# Patient Record
Sex: Female | Born: 1955 | Hispanic: Yes | Marital: Married | Smoking: Never smoker
Health system: Southern US, Community
[De-identification: ages and names within clinical notes are randomized; demographics above are authoritative.]

## PROBLEM LIST (undated history)

## (undated) DIAGNOSIS — I219 Acute myocardial infarction, unspecified: Secondary | ICD-10-CM

## (undated) DIAGNOSIS — E7800 Pure hypercholesterolemia, unspecified: Secondary | ICD-10-CM

## (undated) DIAGNOSIS — E119 Type 2 diabetes mellitus without complications: Secondary | ICD-10-CM

## (undated) DIAGNOSIS — I6529 Occlusion and stenosis of unspecified carotid artery: Secondary | ICD-10-CM

## (undated) DIAGNOSIS — R000 Tachycardia, unspecified: Secondary | ICD-10-CM

## (undated) DIAGNOSIS — I10 Essential (primary) hypertension: Secondary | ICD-10-CM

## (undated) DIAGNOSIS — Z992 Dependence on renal dialysis: Secondary | ICD-10-CM

## (undated) DIAGNOSIS — N186 End stage renal disease: Secondary | ICD-10-CM

## (undated) HISTORY — PX: TUBAL LIGATION: SHX77

## (undated) HISTORY — PX: CARDIAC CATHETERIZATION: SHX172

## (undated) HISTORY — PX: KNEE SURGERY: SHX244

---

## 2004-08-03 ENCOUNTER — Ambulatory Visit: Payer: Self-pay | Admitting: Family Medicine

## 2005-01-26 ENCOUNTER — Ambulatory Visit: Payer: Self-pay | Admitting: Family Medicine

## 2006-09-28 ENCOUNTER — Ambulatory Visit: Payer: Self-pay | Admitting: Family Medicine

## 2008-02-20 ENCOUNTER — Ambulatory Visit: Payer: Self-pay | Admitting: Family Medicine

## 2010-06-03 ENCOUNTER — Ambulatory Visit: Payer: Self-pay | Admitting: Family Medicine

## 2010-07-21 ENCOUNTER — Emergency Department: Payer: Self-pay

## 2011-05-04 IMAGING — CR DG KNEE COMPLETE 4+V*R*
1 series · 4 of 4 positions shown · non-contrast
Comparison: none

REASON FOR EXAM: injury
COMMENTS:   LMP: Post-Menopausal

[Series 1: view not recorded · 0.17mm/px · 4 of 4 slices shown]
[im 1/4]
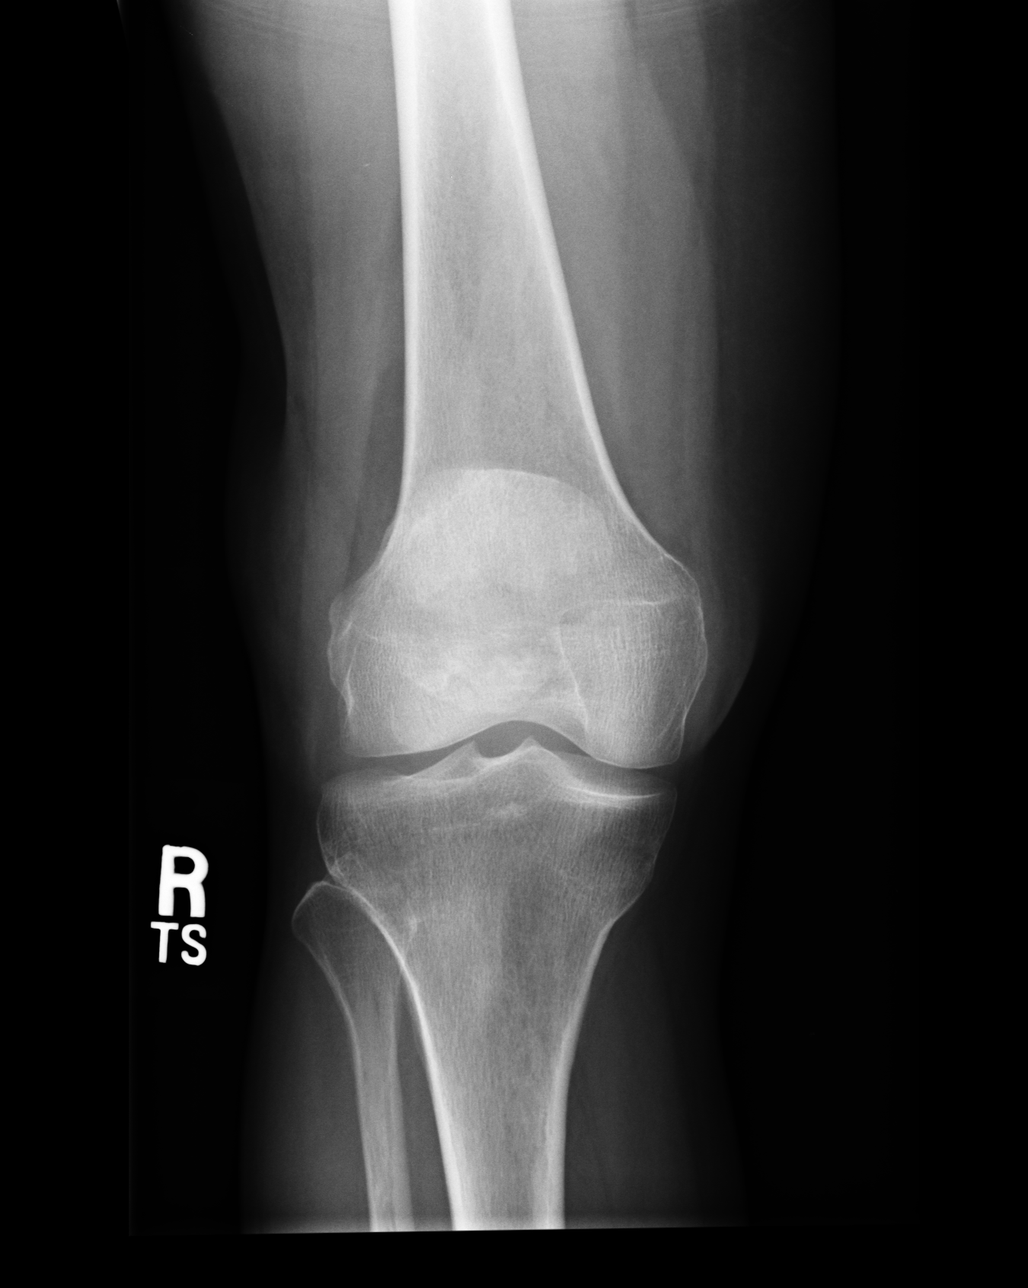
[im 2/4]
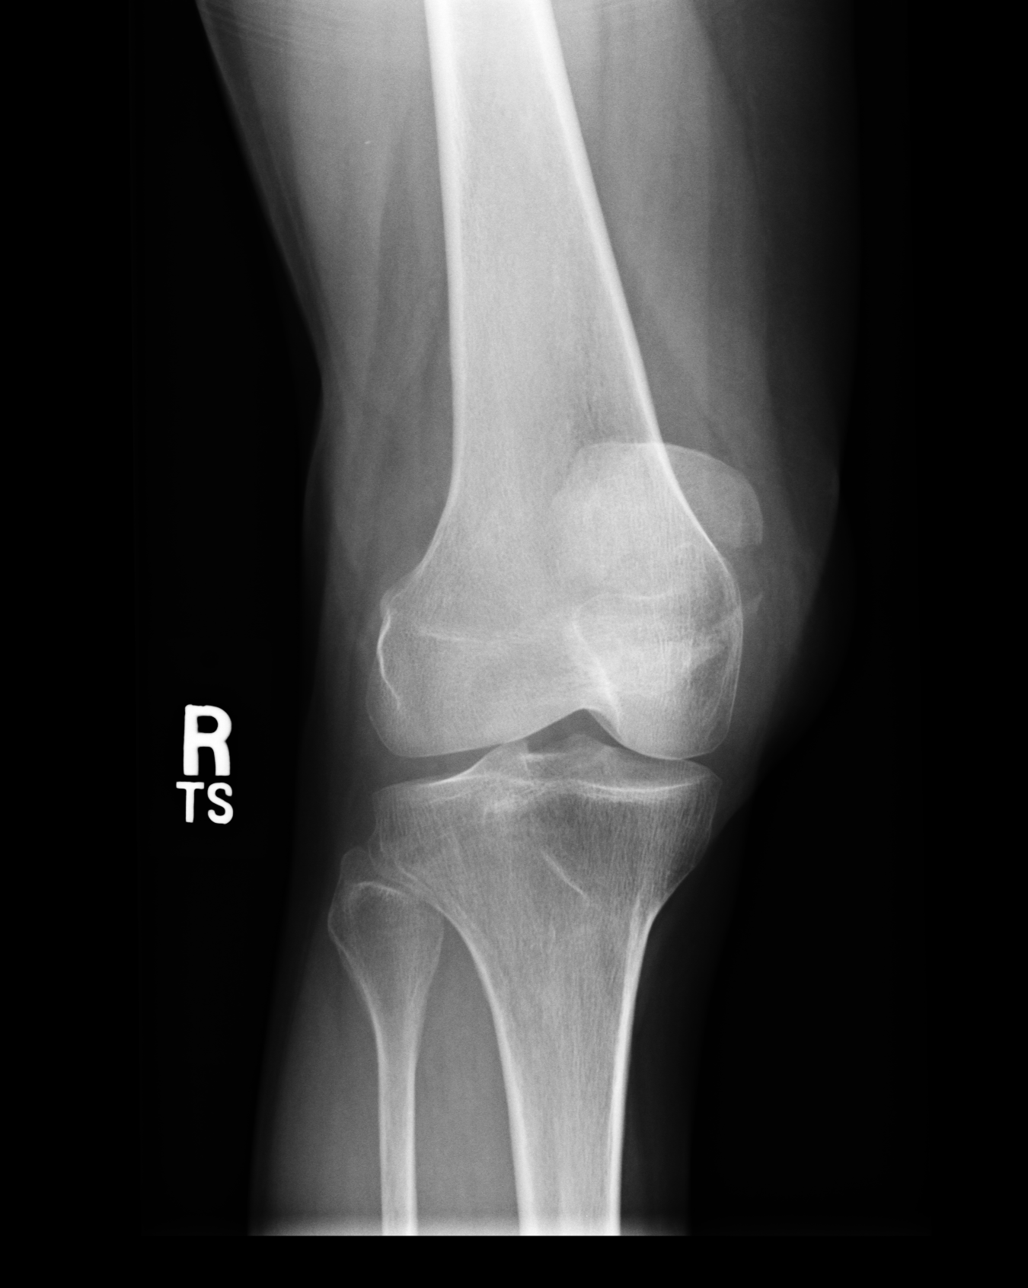
[im 3/4]
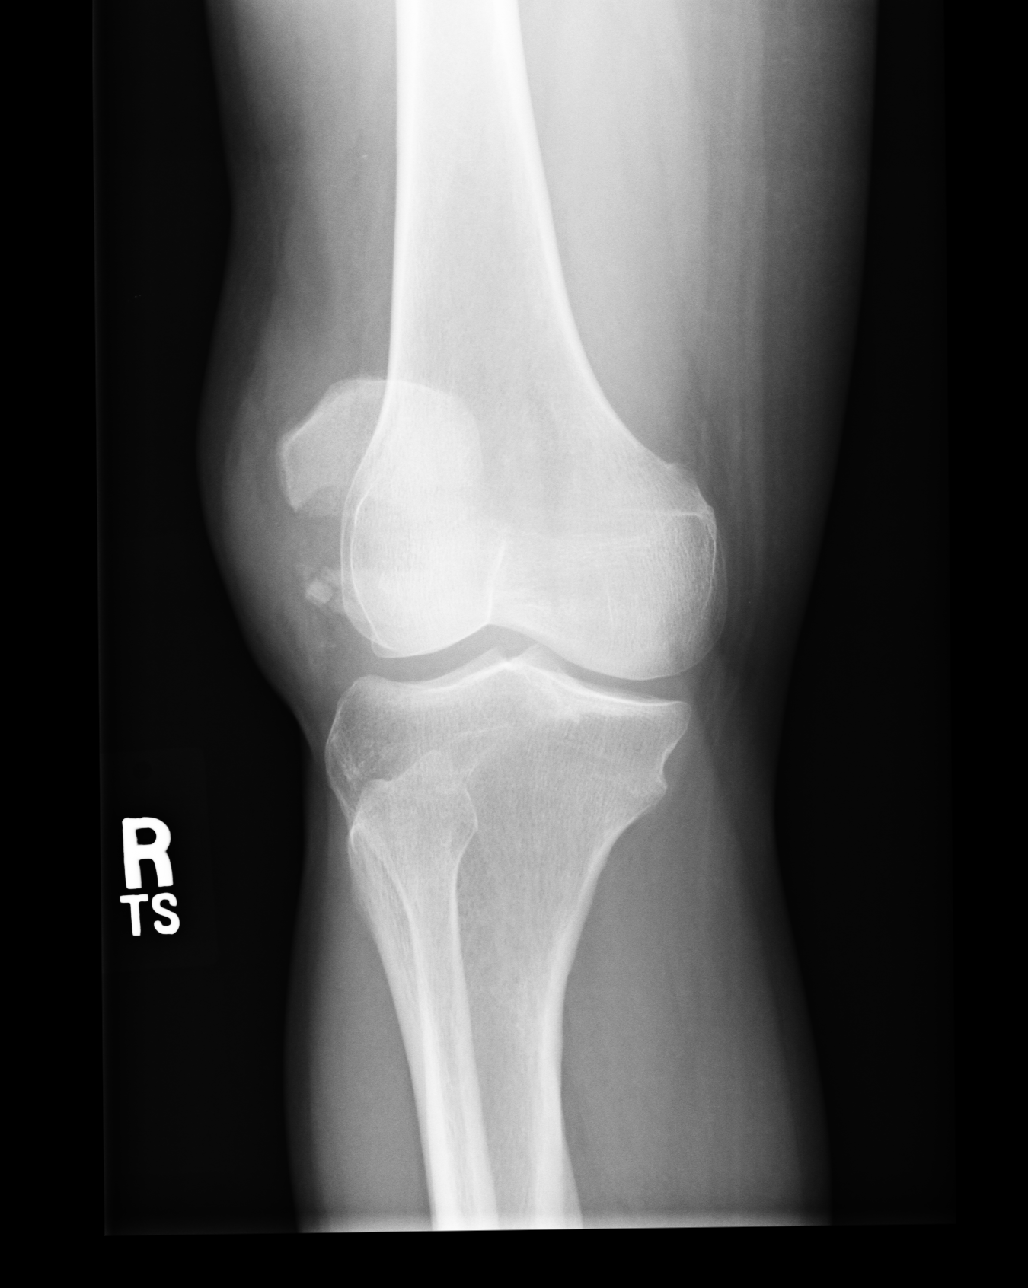
[im 4/4]
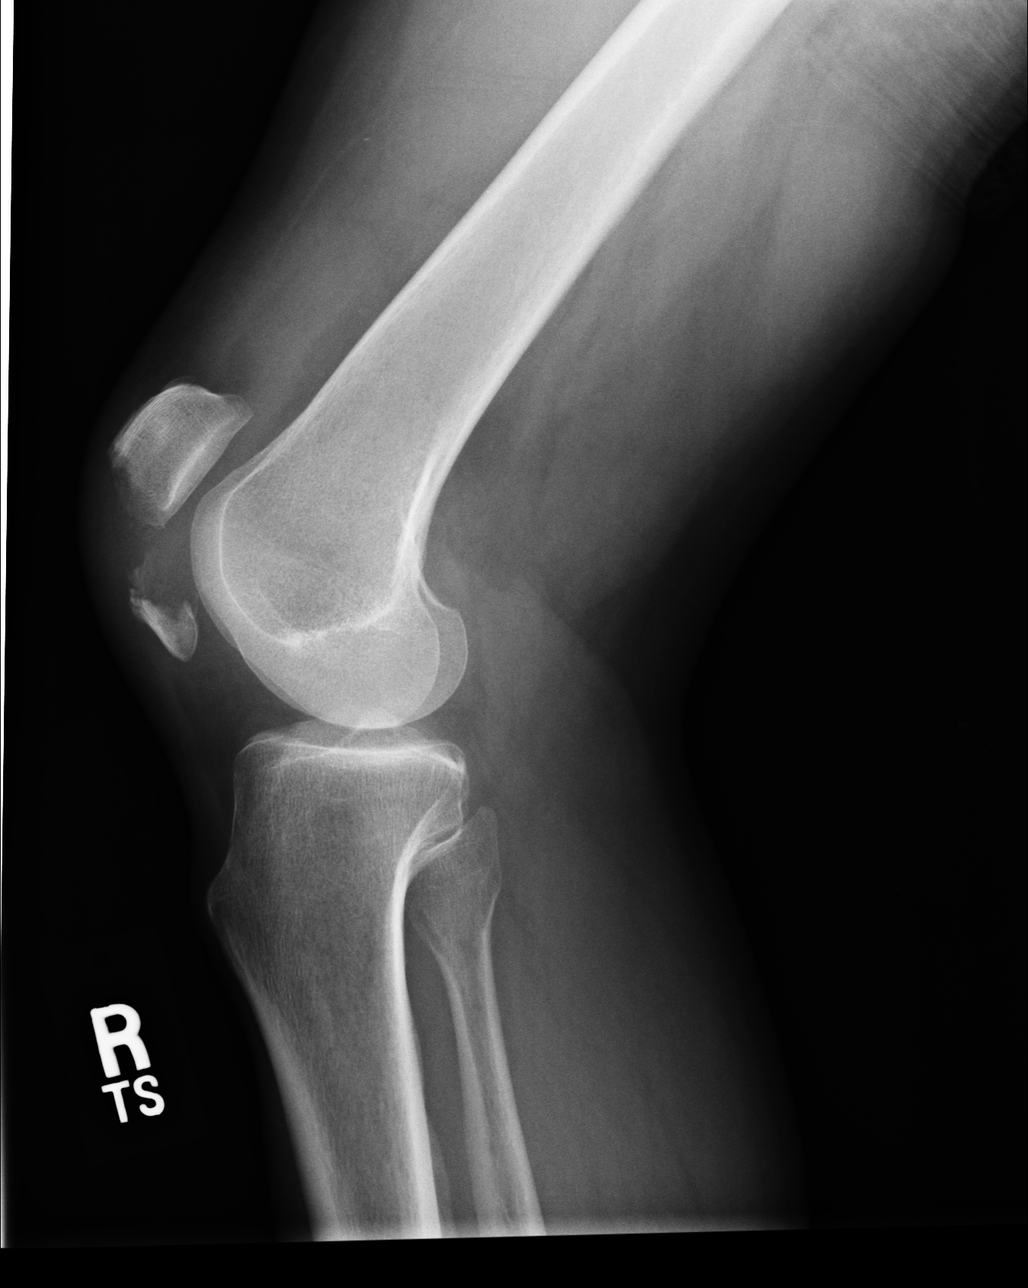

[4 of 4 positions shown; findings below may reference images not displayed]

PROCEDURE:     DXR - DXR KNEE RT COMP WITH OBLIQUES  - July 21, 2010  [DATE]

RESULT:     In the lateral view there is observed a mildly comminuted
transverse fracture of the patella. There is approximately 2.3 cm separation
of the superior and inferior fracture components. No additional fractures
about the knee are seen. The knee joint space is well-maintained.
IMPRESSION: 1.     Fracture of the patella as noted above.

## 2011-07-02 ENCOUNTER — Ambulatory Visit: Payer: Self-pay | Admitting: Family Medicine

## 2011-08-03 ENCOUNTER — Ambulatory Visit: Payer: Self-pay | Admitting: Family Medicine

## 2012-01-17 ENCOUNTER — Ambulatory Visit: Payer: Self-pay

## 2012-05-19 ENCOUNTER — Ambulatory Visit: Payer: Self-pay | Admitting: Family Medicine

## 2013-10-01 LAB — CBC WITH DIFFERENTIAL/PLATELET
BASOS PCT: 0.5 %
Basophil #: 0 10*3/uL (ref 0.0–0.1)
EOS PCT: 0.2 %
Eosinophil #: 0 10*3/uL (ref 0.0–0.7)
HCT: 37.1 % (ref 35.0–47.0)
HGB: 12.6 g/dL (ref 12.0–16.0)
Lymphocyte #: 0.2 10*3/uL — ABNORMAL LOW (ref 1.0–3.6)
Lymphocyte %: 11.5 %
MCH: 31 pg (ref 26.0–34.0)
MCHC: 33.8 g/dL (ref 32.0–36.0)
MCV: 92 fL (ref 80–100)
MONOS PCT: 0.8 %
Monocyte #: 0 x10 3/mm — ABNORMAL LOW (ref 0.2–0.9)
Neutrophil #: 1.8 10*3/uL (ref 1.4–6.5)
Neutrophil %: 87 %
Platelet: 192 10*3/uL (ref 150–440)
RBC: 4.05 10*6/uL (ref 3.80–5.20)
RDW: 13.7 % (ref 11.5–14.5)
WBC: 2.1 10*3/uL — ABNORMAL LOW (ref 3.6–11.0)

## 2013-10-01 LAB — COMPREHENSIVE METABOLIC PANEL
ALT: 198 U/L — AB (ref 12–78)
ANION GAP: 10 (ref 7–16)
Albumin: 3.8 g/dL (ref 3.4–5.0)
Alkaline Phosphatase: 158 U/L — ABNORMAL HIGH
BUN: 17 mg/dL (ref 7–18)
Bilirubin,Total: 1.1 mg/dL — ABNORMAL HIGH (ref 0.2–1.0)
CHLORIDE: 104 mmol/L (ref 98–107)
CO2: 22 mmol/L (ref 21–32)
Calcium, Total: 9.2 mg/dL (ref 8.5–10.1)
Creatinine: 0.93 mg/dL (ref 0.60–1.30)
EGFR (African American): >60
EGFR (Non-African Amer.): >60
Glucose: 224 mg/dL — ABNORMAL HIGH (ref 65–99)
Osmolality: 280 (ref 275–301)
Potassium: 3.6 mmol/L (ref 3.5–5.1)
SGOT(AST): 232 U/L — ABNORMAL HIGH (ref 15–37)
Sodium: 136 mmol/L (ref 136–145)
Total Protein: 8.3 g/dL — ABNORMAL HIGH (ref 6.4–8.2)

## 2013-10-02 ENCOUNTER — Inpatient Hospital Stay: Payer: Self-pay | Admitting: Internal Medicine

## 2013-10-02 LAB — URINALYSIS, COMPLETE
BILIRUBIN, UR: NEGATIVE
Glucose,UR: 150 mg/dL (ref 0–75)
KETONE: NEGATIVE
Nitrite: NEGATIVE
PH: 5 (ref 4.5–8.0)
Specific Gravity: 1.012 (ref 1.003–1.030)
Squamous Epithelial: 1

## 2013-10-02 LAB — WBC: WBC: 8.1 10*3/uL (ref 3.6–11.0)

## 2013-10-02 LAB — HEPATIC FUNCTION PANEL A (ARMC)
ALBUMIN: 2.8 g/dL — AB (ref 3.4–5.0)
ALK PHOS: 116 U/L
AST: 286 U/L — AB (ref 15–37)
Bilirubin, Direct: 0.4 mg/dL — ABNORMAL HIGH (ref 0.00–0.20)
Bilirubin,Total: 0.8 mg/dL (ref 0.2–1.0)
SGPT (ALT): 239 U/L — ABNORMAL HIGH (ref 12–78)
Total Protein: 6.3 g/dL — ABNORMAL LOW (ref 6.4–8.2)

## 2013-10-02 LAB — TROPONIN I
Troponin-I: 0.33 ng/mL — ABNORMAL HIGH
Troponin-I: 1.6 ng/mL — ABNORMAL HIGH
Troponin-I: 1.7 ng/mL — ABNORMAL HIGH

## 2013-10-02 LAB — CK-MB
CK-MB: 1.7 ng/mL (ref 0.5–3.6)
CK-MB: 2.3 ng/mL (ref 0.5–3.6)

## 2013-10-02 LAB — CK TOTAL AND CKMB (NOT AT ARMC)
CK, TOTAL: 212 U/L — AB
CK-MB: 0.7 ng/mL (ref 0.5–3.6)

## 2013-10-03 LAB — CBC WITH DIFFERENTIAL/PLATELET
BASOS ABS: 0 10*3/uL (ref 0.0–0.1)
Basophil %: 0.2 %
Eosinophil #: 0 10*3/uL (ref 0.0–0.7)
Eosinophil %: 0.4 %
HCT: 30.8 % — AB (ref 35.0–47.0)
HGB: 10.4 g/dL — AB (ref 12.0–16.0)
Lymphocyte #: 0.6 10*3/uL — ABNORMAL LOW (ref 1.0–3.6)
Lymphocyte %: 4.7 %
MCH: 31 pg (ref 26.0–34.0)
MCHC: 33.7 g/dL (ref 32.0–36.0)
MCV: 92 fL (ref 80–100)
MONOS PCT: 3 %
Monocyte #: 0.4 x10 3/mm (ref 0.2–0.9)
NEUTROS PCT: 91.7 %
Neutrophil #: 11.2 10*3/uL — ABNORMAL HIGH (ref 1.4–6.5)
PLATELETS: 112 10*3/uL — AB (ref 150–440)
RBC: 3.34 10*6/uL — ABNORMAL LOW (ref 3.80–5.20)
RDW: 13.8 % (ref 11.5–14.5)
WBC: 12.2 10*3/uL — AB (ref 3.6–11.0)

## 2013-10-03 LAB — HEPATIC FUNCTION PANEL A (ARMC)
ALT: 186 U/L — AB (ref 12–78)
Albumin: 2.4 g/dL — ABNORMAL LOW (ref 3.4–5.0)
Alkaline Phosphatase: 129 U/L — ABNORMAL HIGH
BILIRUBIN DIRECT: 0.5 mg/dL — AB (ref 0.00–0.20)
BILIRUBIN TOTAL: 1 mg/dL (ref 0.2–1.0)
SGOT(AST): 165 U/L — ABNORMAL HIGH (ref 15–37)
TOTAL PROTEIN: 6 g/dL — AB (ref 6.4–8.2)

## 2013-10-03 LAB — URINE CULTURE

## 2013-10-03 LAB — BASIC METABOLIC PANEL
ANION GAP: 9 (ref 7–16)
BUN: 15 mg/dL (ref 7–18)
CHLORIDE: 112 mmol/L — AB (ref 98–107)
Calcium, Total: 7.3 mg/dL — ABNORMAL LOW (ref 8.5–10.1)
Co2: 21 mmol/L (ref 21–32)
Creatinine: 0.88 mg/dL (ref 0.60–1.30)
EGFR (African American): >60
EGFR (Non-African Amer.): >60
Glucose: 148 mg/dL — ABNORMAL HIGH (ref 65–99)
OSMOLALITY: 287 (ref 275–301)
Potassium: 2.9 mmol/L — ABNORMAL LOW (ref 3.5–5.1)
SODIUM: 142 mmol/L (ref 136–145)

## 2013-10-03 LAB — MAGNESIUM: MAGNESIUM: 1.1 mg/dL — AB

## 2013-10-04 LAB — CBC WITH DIFFERENTIAL/PLATELET
Basophil #: 0.1 10*3/uL (ref 0.0–0.1)
Basophil %: 0.6 %
Eosinophil #: 0.1 10*3/uL (ref 0.0–0.7)
Eosinophil %: 1 %
HCT: 31.3 % — ABNORMAL LOW (ref 35.0–47.0)
HGB: 10.4 g/dL — ABNORMAL LOW (ref 12.0–16.0)
Lymphocyte #: 1.1 10*3/uL (ref 1.0–3.6)
Lymphocyte %: 8.9 %
MCH: 30.7 pg (ref 26.0–34.0)
MCHC: 33.4 g/dL (ref 32.0–36.0)
MCV: 92 fL (ref 80–100)
MONOS PCT: 4 %
Monocyte #: 0.5 x10 3/mm (ref 0.2–0.9)
Neutrophil #: 10.1 10*3/uL — ABNORMAL HIGH (ref 1.4–6.5)
Neutrophil %: 85.5 %
PLATELETS: 140 10*3/uL — AB (ref 150–440)
RBC: 3.41 10*6/uL — ABNORMAL LOW (ref 3.80–5.20)
RDW: 14 % (ref 11.5–14.5)
WBC: 11.8 10*3/uL — ABNORMAL HIGH (ref 3.6–11.0)

## 2013-10-04 LAB — BASIC METABOLIC PANEL
Anion Gap: 6 — ABNORMAL LOW (ref 7–16)
BUN: 11 mg/dL (ref 7–18)
CREATININE: 0.75 mg/dL (ref 0.60–1.30)
Calcium, Total: 7.7 mg/dL — ABNORMAL LOW (ref 8.5–10.1)
Chloride: 112 mmol/L — ABNORMAL HIGH (ref 98–107)
Co2: 24 mmol/L (ref 21–32)
EGFR (African American): >60
EGFR (Non-African Amer.): >60
GLUCOSE: 197 mg/dL — AB (ref 65–99)
OSMOLALITY: 288 (ref 275–301)
Potassium: 4.8 mmol/L (ref 3.5–5.1)
Sodium: 142 mmol/L (ref 136–145)

## 2013-10-04 LAB — URINE CULTURE

## 2013-10-05 LAB — BASIC METABOLIC PANEL
Anion Gap: 6 — ABNORMAL LOW (ref 7–16)
BUN: 11 mg/dL (ref 7–18)
CALCIUM: 7.9 mg/dL — AB (ref 8.5–10.1)
CO2: 24 mmol/L (ref 21–32)
CREATININE: 0.8 mg/dL (ref 0.60–1.30)
Chloride: 110 mmol/L — ABNORMAL HIGH (ref 98–107)
EGFR (African American): >60
EGFR (Non-African Amer.): >60
GLUCOSE: 244 mg/dL — AB (ref 65–99)
OSMOLALITY: 287 (ref 275–301)
POTASSIUM: 4.7 mmol/L (ref 3.5–5.1)
Sodium: 140 mmol/L (ref 136–145)

## 2013-10-05 LAB — CBC WITH DIFFERENTIAL/PLATELET
Basophil #: 0.1 10*3/uL (ref 0.0–0.1)
Basophil %: 0.5 %
Eosinophil #: 0.1 10*3/uL (ref 0.0–0.7)
Eosinophil %: 0.6 %
HCT: 31.1 % — AB (ref 35.0–47.0)
HGB: 10.5 g/dL — AB (ref 12.0–16.0)
LYMPHS ABS: 1.3 10*3/uL (ref 1.0–3.6)
Lymphocyte %: 13.6 %
MCH: 30.7 pg (ref 26.0–34.0)
MCHC: 33.7 g/dL (ref 32.0–36.0)
MCV: 91 fL (ref 80–100)
MONO ABS: 0.7 x10 3/mm (ref 0.2–0.9)
Monocyte %: 6.9 %
NEUTROS PCT: 78.4 %
Neutrophil #: 7.6 10*3/uL — ABNORMAL HIGH (ref 1.4–6.5)
PLATELETS: 159 10*3/uL (ref 150–440)
RBC: 3.42 10*6/uL — ABNORMAL LOW (ref 3.80–5.20)
RDW: 13.7 % (ref 11.5–14.5)
WBC: 9.7 10*3/uL (ref 3.6–11.0)

## 2013-10-05 LAB — PROTIME-INR
INR: 1
PROTHROMBIN TIME: 12.7 s (ref 11.5–14.7)

## 2013-10-05 LAB — HEMOGLOBIN A1C: Hemoglobin A1C: 7.8 % — ABNORMAL HIGH (ref 4.2–6.3)

## 2013-10-06 LAB — CULTURE, BLOOD (SINGLE)

## 2014-07-13 NOTE — H&P (Signed)
PATIENT NAME:  Cathy Cline, Cathy Cline MR#:  [id number] DATE OF BIRTH:  06/25/1955  DATE OF ADMISSION:  10/02/2013  PRIMARY CARE PHYSICIAN: Dr. Sena Hitch  REFERRING PHYSICIAN:    CHIEF COMPLAINT: Fever, nausea, vomiting.   HISTORY OF PRESENT ILLNESS: This patient is a 59 year old female who has been experiencing generalized weakness, nausea, vomiting since yesterday morning. Concerning this, the patient is brought to the Emergency Department. In the Emergency Department, the patient is found to have fever of 103, tachycardic, with a heart rate of 120. The patient is found to be strongly positive for urinary tract infection. Denies having any abdominal pain. The patient is also found to have mild elevation of the troponin of 0.3. The patient states she gets these chest pains at home on the left side of the chest, radiating into the back. The patient received Rocephin in the Emergency Department.   PAST MEDICAL HISTORY: 1.  Diabetes mellitus.  2.  Hypertension.    PAST SURGICAL HISTORY: 1.  Right knee surgery.  2.  Partial hysterectomy.    ALLERGIES: No known drug allergies.   HOME MEDICATIONS: 1.   mg once a day.  2.  Multivitamin once a day.  3.  Metformin 2 tablets 2 times a day.  4.  Glipizide 1 tablet once a day. 5.  Atorvastatin 40 mg once a day. 6.  Aspirin 81 mg daily.    SOCIAL HISTORY: No history of smoking, drinking alcohol or using illicit drugs. Married. Lives with her husband.   FAMILY HISTORY: Strong family history of diabetes mellitus, hypertension.   REVIEW OF SYSTEMS: CONSTITUTIONAL: Experiencing severe generalized weakness.  EYES: No change in vision.  ENT: No change in hearing.  RESPIRATORY: Has mild cough. No shortness of breath.  CARDIOVASCULAR: Experiences on and off chest pain.  GASTROINTESTINAL: Has nausea, vomiting, no diarrhea.  GENITOURINARY: No dysuria or hematuria.  HEMATOLOGIC: No easy bruising or bleeding.  SKIN: No rash or lesions.   MUSCULOSKELETAL: Generalized body aches. No joint swelling. NEUROLOGIC: No weakness or numbness in any part of the body.   PHYSICAL EXAMINATION: GENERAL: This is a well-built, well-nourished, age-appropriate female, lying down in the bed, not in distress.  VITAL SIGNS: Temperature 103, pulse 121, blood pressure 145/61, respiratory rate of 30, oxygen saturation 92% on room air.  HEENT: Head normocephalic, atraumatic. There is no scleral icterus. Conjunctivae normal. Pupils equal and react to light. Extraocular movements are intact. Mucous membranes: Mild dryness. No pharyngeal erythema.  NECK: Supple. No lymphadenopathy. No JVD. No carotid bruit.  CHEST: No focal tenderness.  LUNGS: Bilaterally clear to auscultation.  HEART: S1, S2, regular. Tachycardia.  ABDOMEN: Bowel sounds present. Soft, nontender, nondistended. No hepatosplenomegaly.  EXTREMITIES: No pedal edema. Pulses 2+. SKIN: No rash or lesions.  MUSCULOSKELETAL: Good range of motion in all the extremities.  NEUROLOGIC: The patient is alert, oriented to place, person, and time. Cranial nerves II through XII intact. Motor 5/5 in upper and lower extremities.   LABORATORIES: Urinalysis: 2+ leukocyte esterase, WBC of 41, 3+ bacteria. Chest x-ray, one view, portable: Mild cardiomegaly, no acute cardiopulmonary disease. Ultrasound of the kidneys shows showed no acute sonographic findings. Gallbladder polyp. Troponin 0.33; however, CK-MB of 0.7. Lactic acid 2.3. CBC: WBC of 2.1 with a left shift of 87%. Glucose 224.  ASSESSMENT AND PLAN: The patient is a 59 year old female with known history of diabetes mellitus, who comes with sepsis from a urinary tract infection.   1.  Sepsis. The patient has leukopenia with a  left shift. Blood cultures have been obtained. Continue with Rocephin. Continue with intravenous fluids.  2.  Diabetes mellitus. Continue with home medications. Hold metformin.  3.  Hypertension, currently well controlled. Continue  with home medications.  4.  Keep the patient on deep vein thrombosis prophylaxis with Lovenox.   TIME SPENT: 50 minutes.     ____________________________ Monica Becton, MD pv:cg D: 10/02/2013 02:16:55 ET T: 10/02/2013 02:53:29 ET JOB#: [id number]  cc: Monica Becton, MD, <Dictator> Monica Becton MD ELECTRONICALLY SIGNED 10/03/2013 0:10

## 2014-07-13 NOTE — Consult Note (Signed)
PATIENT NAME:  Cathy Cline, RAINA MR#:  [id number] DATE OF BIRTH:  03/21/1956  DATE OF CONSULTATION:  10/02/2013.  REFERRING PHYSICIAN:  Dr. Minerva Fester PHYSICIAN:  Corey Skains, MD  REASON FOR CONSULTATION: Elevated troponin, diabetes, hypertension and urinary tract infection.   CHIEF COMPLAINT: "I have chest pain."   HISTORY OF PRESENT ILLNESS: This is a 59 year old female with known diabetes, hypertension with a current urinary tract infection having an acute and/or chronic left-sided back and chest discomfort radiating into the midsection with and without weakness and fatigue over the last several days, culminating in an EKG showing normal sinus rhythm, with a troponin of 1.6.  The patient does have urinary tract infection, which may need further evaluation.   The remainder of the review of systems is negative for vision change, ringing in the ears, hearing loss, cough, congestion, heartburn, nausea, vomiting, diarrhea, bloody stools, stomach pain, extremity pain, leg weakness, cramping of the buttocks, known blood clots, headaches, blackouts, dizzy spells, nosebleeds, congestion, trouble swallowing, numbness, anxiety, depression, skin lesions or skin rashes.   PAST MEDICAL HISTORY:  1.  Diabetes.  2.  Hypertension.  3.  Urinary tract infection.   FAMILY HISTORY: No family members with early onset of cardiovascular disease or hypertension.   SOCIAL HISTORY: Currently denies alcohol or tobacco use.   ALLERGIES: AS LISTED.   MEDICATIONS: As listed.   PHYSICAL EXAMINATION:  VITAL SIGNS: Blood pressure is 132/68, bilaterally. Heart rate 77 upright, reclining, and regular.  GENERAL: She is a well-appearing female in no acute distress. HEENT: No icterus, thyromegaly, ulcers, hemorrhage, or xanthelasma.  CARDIOVASCULAR: Regular rate and rhythm. Normal S1, S2 without murmur, gallop, or rub. PMI is normal size and placement. Carotid upstroke normal without bruit. Jugular venous  pressure is normal.  LUNGS: Clear to auscultation with normal respirations.  ABDOMEN: Soft, nontender, without hepatosplenomegaly or masses. Abdominal aorta is normal size without bruit.  EXTREMITIES: 2+ bilateral pulses in dorsal, pedal, radial and femoral arteries without lower extremity edema, cyanosis, clubbing or ulcers.  NEUROLOGIC: She is oriented to time, place, and person, with normal mood and affect.   ASSESSMENT: A 59 year old female with diabetes, hypertension and urinary tract infection with elevated troponin and chest and back pain consistent with acute subendocardial myocardial infarction.   RECOMMENDATIONS:  1.  Continue serial ECG and enzymes to assess for extent of myocardial infarction.  2.  Further investigation including the possibility of cardiac catheterization for further evaluation of above. The patient understands the risks and benefits of cardiac catheterization; these include the possibility of death, stroke, heart attack, infection, bleeding, blood clot. She is at low risk for conscious sedation.  3.  Diabetes medication management as before for a goal hemoglobin A1c below 7.  4.  Hypertension control with current medical regimen.  5.  Further treatment options after above.    ____________________________ Corey Skains, MD bjk:lt D: 10/02/2013 10:17:16 ET T: 10/02/2013 10:51:15 ET JOB#: [id number]  cc: Corey Skains, MD, <Dictator> Corey Skains MD ELECTRONICALLY SIGNED 10/05/2013 8:20

## 2014-07-13 NOTE — Discharge Summary (Signed)
PATIENT NAME:  Cathy Cline, Cathy Cline MR#:  [id number] DATE OF BIRTH:  [birth year]  DATE OF ADMISSION:  10/02/2013 DATE OF DISCHARGE:  10/05/2013  ADMITTING DIAGNOSIS: Sepsis.   DISCHARGE DIAGNOSES:  1. Klebsiella sepsis. 2. Acute pyelonephritis due to Klebsiella. 3. Sinus rhythm with tachycardia. 4. Non-Q-wave myocardial infarction status post cardiac catheterization on 10/05/2013 by Dr. Nehemiah Massed revealing normal ejection fraction, minimal 3-vessel coronary artery disease.  5. Left upper back pain of unclear etiology, suspected musculoskeletal.  6. Elevated transaminases of unclear etiology, likely a fatty liver disease.  7. Gallbladder polyp. Surgical consultation as outpatient is recommended.  8. Left upper lobe lung nodule. Followup with CT scan in 3 months is recommended.  9. Diabetes mellitus with hemoglobin A1c 7.8.  10. Hypertension.  11. Obesity.   DISCHARGE CONDITION: Stable.   DISCHARGE MEDICATIONS:  1. The patient is to continue metformin extended-release 500 mg 2 tablets twice daily. 2. Atorvastatin 40 mg p.o. at bedtime. 3. Glipizide extended release 10 mg once daily. 4. Pioglitazone 45 mg p.o. daily.  5. Aspirin 81 mg p.o. daily.  6. Multivitamins 1 daily.  7. Metoprolol 25 mg p.o. twice daily.  8. Keflex 500 mg p.o. every 8 hours for 12 more days to complete 14-day course.   HOME OXYGEN: None.   DIET: 2 grams, low-fat, low-cholesterol, carbohydrate -controlled diet, regular consistency.   ACTIVITY LIMITATIONS: As tolerated.   FOLLOWUP APPOINTMENT: With [facility]. Shade Flood [facility] in 2 days after discharge, Dr. Nehemiah Massed in 1 week after discharge, Dr. Marina Gravel surgery in 2-3 weeks after discharge.   CONSULTANTS: Dr. Nehemiah Massed, care management, social work.   RADIOLOGIC STUDIES: Chest x-ray, portable single view, 10/01/2013 revealing no active cardiopulmonary abnormality, mild cardiomegaly. A CT of chest without contrast 10/01/2013, showing small bilateral pleural  effusions, mild right basilar consolidation. Vague ground-glass density in the left upper lobe. Initial followup by chest CT without contrast is recommended in 3 months to confirm persistence. This recommendation follows the consensus statement recommendations for the management of subsolid pulmonary nodules detected at CT. A statement from the [facility] as published in Radiology 2013. Ultrasound of abdomen, limited survey, 10/02/2013 revealing no acute sonographic findings, an 11-mm gallbladder polyp of this size, recommend nonemergent surgical referral.   Cardiac catheterization 10/05/2013 by Dr. Nehemiah Massed showing coronary circulation is right dominant, proximal LAD 20% stenosis, first diagonal 25% stenosis, proximal circumflex 10% stenosis, mid RCA 20% stenosis. An ejection fraction is normal at 55% and minimal 3-vessel coronary artery disease.  HOSPITAL COURSE: The patient is a 59 year old Spanish female whose past medical history is significant for history of hypertension, diabetes mellitus, who presents to the hospital with complaints of nausea, vomiting, left upper posterior chest pains. Please refer to Dr. Ward Givens admission note on 10/02/2013. The patient was tachycardic and was found to have fevers to 103 and her heart rate was 120.   LABORATORY DATA: Her lab data done on admission showed elevated glucose to 224 otherwise BMP was unremarkable. The patient's liver enzymes revealed a total bilirubin of 1.1, alkaline phosphatase 158, AST as well as ALT were 232 and 198 respectively. CK total was 212, MB fraction on the first set was 0.7 and troponin was elevated at 0.33. The patient's second set revealed troponin elevation at 1.6 with normal MB fraction and third set revealed troponin of 1.7. The patient's white blood cell count initially on admission was low at 2.1, hemoglobin was 12.6, platelet count 192,000. Absolute neutrophil count was 1.8. Blood cultures taken on the  day of admission  10/02/2013 showed Klebsiella pneumoniae species resistant to ampicillin, however, sensitive to all other antibiotics. Urine culture also revealed Klebsiella pneumonia as well as some aerobic gram-positive rod but no further identification is given. The same sensitivities for Klebsiella were noted. The patient's urinalysis was minimally abnormal with 2+ leukocyte esterase, 8 red blood cells, 41 white blood cells, 3+ bacteria. Lactic acid level was high at 3.3.   EKG showed sinus tach at 134 beats per minute, occasional premature ventricular complexes, ST-T wave abnormality, consider anterior ischemia, according to EKG criteria.   The patient was admitted to the hospital for further evaluation. She was initiated on Rocephin and her condition slowly improved. She still was complaining of left upper chest pain and there was a concern that patient may have had ongoing ischemia. Consultation with cardiologist, Dr. Nehemiah Massed, was obtained and as soon as cultures came back it was clear that the patient can undergo cardiac catheterization since the patient had received antibiotic therapy for a few days. The patient underwent catheterization on 10/05/2013 which revealed a normal ejection fraction and minimal acute vessel coronary artery disease. Medical therapy was recommended. The patient also had an ultrasound of her liver done to rule out cholecystitis and her liver enzymes were abnormal. However, her liver ultrasound did not show any significant changes in the gallbladder itself except for gallbladder polyp. This was discussed with Dr. Marina Gravel who recommended surgical consultation as outpatient in the next few weeks after discharge. The patient was felt satisfactory on the day of discharge. She is being discharged home with the above-mentioned medications and followup.   VITALS SIGNS: On the day of discharge, patient's vital signs temperature was 98.1, pulse was 88, respiration rate was 18, blood pressure 150/64.  Saturation was 96% on room air at rest.   TIME SPENT: 40 minutes.    ____________________________ Theodoro Grist, MD rv:lt D: 10/05/2013 20:31:00 ET T: 10/06/2013 05:10:41 ET JOB#: [id number]  cc: Theodoro Grist, MD, <Dictator> Sena Hitch, MD Corey Skains, MD Jeannette How Marina Gravel, MD Theodoro Grist MD ELECTRONICALLY SIGNED 10/26/2013 15:12

## 2014-07-17 ENCOUNTER — Other Ambulatory Visit: Payer: Self-pay | Admitting: Family Medicine

## 2014-07-17 DIAGNOSIS — Z1231 Encounter for screening mammogram for malignant neoplasm of breast: Secondary | ICD-10-CM

## 2014-08-23 ENCOUNTER — Other Ambulatory Visit: Payer: Self-pay | Admitting: Family Medicine

## 2014-08-23 ENCOUNTER — Ambulatory Visit
Admission: RE | Admit: 2014-08-23 | Discharge: 2014-08-23 | Disposition: A | Discharge status: Home / Self Care | Payer: BLUE CROSS/BLUE SHIELD | Source: Ambulatory Visit | Attending: Family Medicine | Admitting: Family Medicine

## 2014-08-23 DIAGNOSIS — Z1231 Encounter for screening mammogram for malignant neoplasm of breast: Secondary | ICD-10-CM

## 2014-08-23 DIAGNOSIS — R918 Other nonspecific abnormal finding of lung field: Secondary | ICD-10-CM

## 2014-08-30 ENCOUNTER — Ambulatory Visit
Admission: RE | Admit: 2014-08-30 | Discharge: 2014-08-30 | Disposition: A | Discharge status: Home / Self Care | Payer: BLUE CROSS/BLUE SHIELD | Source: Ambulatory Visit | Attending: Family Medicine | Admitting: Family Medicine

## 2014-08-30 DIAGNOSIS — R918 Other nonspecific abnormal finding of lung field: Secondary | ICD-10-CM | POA: Diagnosis present

## 2014-08-30 DIAGNOSIS — I2510 Atherosclerotic heart disease of native coronary artery without angina pectoris: Secondary | ICD-10-CM | POA: Insufficient documentation

## 2015-08-07 ENCOUNTER — Other Ambulatory Visit: Payer: Self-pay | Admitting: Family Medicine

## 2015-08-07 DIAGNOSIS — R918 Other nonspecific abnormal finding of lung field: Secondary | ICD-10-CM

## 2015-09-22 ENCOUNTER — Ambulatory Visit
Admission: RE | Admit: 2015-09-22 | Discharge: 2015-09-22 | Disposition: A | Discharge status: Home / Self Care | Payer: BLUE CROSS/BLUE SHIELD | Source: Ambulatory Visit | Attending: Family Medicine | Admitting: Family Medicine

## 2015-09-22 DIAGNOSIS — I700 Atherosclerosis of aorta: Secondary | ICD-10-CM | POA: Insufficient documentation

## 2015-09-22 DIAGNOSIS — R918 Other nonspecific abnormal finding of lung field: Secondary | ICD-10-CM | POA: Diagnosis present

## 2015-09-22 DIAGNOSIS — R911 Solitary pulmonary nodule: Secondary | ICD-10-CM | POA: Insufficient documentation

## 2015-09-22 DIAGNOSIS — K8020 Calculus of gallbladder without cholecystitis without obstruction: Secondary | ICD-10-CM | POA: Diagnosis not present

## 2015-10-01 ENCOUNTER — Encounter: Payer: Self-pay | Admitting: *Deleted

## 2015-10-02 ENCOUNTER — Encounter: Payer: Self-pay | Admitting: *Deleted

## 2015-10-02 ENCOUNTER — Encounter
Admission: RE | Disposition: A | Discharge status: Home / Self Care | Payer: Self-pay | Source: Ambulatory Visit | Attending: Unknown Physician Specialty

## 2015-10-02 ENCOUNTER — Ambulatory Visit: Payer: BLUE CROSS/BLUE SHIELD | Admitting: Anesthesiology

## 2015-10-02 ENCOUNTER — Ambulatory Visit
Admission: RE | Admit: 2015-10-02 | Discharge: 2015-10-02 | Disposition: A | Discharge status: Home / Self Care | Payer: BLUE CROSS/BLUE SHIELD | Source: Ambulatory Visit | Attending: Unknown Physician Specialty | Admitting: Unknown Physician Specialty

## 2015-10-02 DIAGNOSIS — Z9889 Other specified postprocedural states: Secondary | ICD-10-CM | POA: Insufficient documentation

## 2015-10-02 DIAGNOSIS — K640 First degree hemorrhoids: Secondary | ICD-10-CM | POA: Diagnosis not present

## 2015-10-02 DIAGNOSIS — D120 Benign neoplasm of cecum: Secondary | ICD-10-CM | POA: Diagnosis not present

## 2015-10-02 DIAGNOSIS — K635 Polyp of colon: Secondary | ICD-10-CM | POA: Diagnosis not present

## 2015-10-02 DIAGNOSIS — Z1211 Encounter for screening for malignant neoplasm of colon: Secondary | ICD-10-CM | POA: Insufficient documentation

## 2015-10-02 DIAGNOSIS — Z7982 Long term (current) use of aspirin: Secondary | ICD-10-CM | POA: Diagnosis not present

## 2015-10-02 DIAGNOSIS — I252 Old myocardial infarction: Secondary | ICD-10-CM | POA: Insufficient documentation

## 2015-10-02 DIAGNOSIS — D123 Benign neoplasm of transverse colon: Secondary | ICD-10-CM | POA: Diagnosis not present

## 2015-10-02 DIAGNOSIS — E119 Type 2 diabetes mellitus without complications: Secondary | ICD-10-CM | POA: Insufficient documentation

## 2015-10-02 DIAGNOSIS — I10 Essential (primary) hypertension: Secondary | ICD-10-CM | POA: Insufficient documentation

## 2015-10-02 DIAGNOSIS — E7800 Pure hypercholesterolemia, unspecified: Secondary | ICD-10-CM | POA: Insufficient documentation

## 2015-10-02 DIAGNOSIS — Z794 Long term (current) use of insulin: Secondary | ICD-10-CM | POA: Diagnosis not present

## 2015-10-02 DIAGNOSIS — Z79899 Other long term (current) drug therapy: Secondary | ICD-10-CM | POA: Diagnosis not present

## 2015-10-02 HISTORY — PX: COLONOSCOPY WITH PROPOFOL: SHX5780

## 2015-10-02 HISTORY — DX: Pure hypercholesterolemia, unspecified: E78.00

## 2015-10-02 HISTORY — DX: Type 2 diabetes mellitus without complications: E11.9

## 2015-10-02 HISTORY — DX: Acute myocardial infarction, unspecified: I21.9

## 2015-10-02 HISTORY — DX: Essential (primary) hypertension: I10

## 2015-10-02 HISTORY — DX: Occlusion and stenosis of unspecified carotid artery: I65.29

## 2015-10-02 HISTORY — DX: Tachycardia, unspecified: R00.0

## 2015-10-02 LAB — GLUCOSE, CAPILLARY: GLUCOSE-CAPILLARY: 146 mg/dL — AB (ref 65–99)

## 2015-10-02 SURGERY — COLONOSCOPY WITH PROPOFOL
Anesthesia: General

## 2015-10-02 MED ORDER — METOPROLOL TARTRATE 25 MG PO TABS
ORAL_TABLET | ORAL | Status: AC
  Administered 2015-10-02: 25 mg
  Filled 2015-10-02: qty 1

## 2015-10-02 MED ORDER — PROPOFOL 500 MG/50ML IV EMUL
INTRAVENOUS | Status: DC | PRN
  Administered 2015-10-02: 100 ug/kg/min via INTRAVENOUS

## 2015-10-02 MED ORDER — SODIUM CHLORIDE 0.9 % IV SOLN: INTRAVENOUS | Status: DC

## 2015-10-02 MED ORDER — FENTANYL CITRATE (PF) 100 MCG/2ML IJ SOLN
INTRAMUSCULAR | Status: DC | PRN
  Administered 2015-10-02: 50 ug via INTRAVENOUS

## 2015-10-02 MED ORDER — SODIUM CHLORIDE 0.9 % IV SOLN
INTRAVENOUS | Status: DC
  Administered 2015-10-02: 10:00:00 via INTRAVENOUS

## 2015-10-02 MED ORDER — MIDAZOLAM HCL 2 MG/2ML IJ SOLN
INTRAMUSCULAR | Status: DC | PRN
  Administered 2015-10-02: 1 mg via INTRAVENOUS

## 2015-10-02 NOTE — Brief Op Note (Signed)
Spanish interpreter present at bedside for RN, anesthesia and procedural MD examination and questions.

## 2015-10-02 NOTE — Transfer of Care (Signed)
Immediate Anesthesia Transfer of Care Note  Patient: Cathy Cline  Procedure(s) Performed: Procedure(s): COLONOSCOPY WITH PROPOFOL (N/A)  Patient Location: PACU  Anesthesia Type:General  Level of Consciousness: awake and sedated  Airway & Oxygen Therapy: Patient Spontanous Breathing and Patient connected to nasal cannula oxygen  Post-op Assessment: Report given to RN and Post -op Vital signs reviewed and stable  Post vital signs: Reviewed and stable  Last Vitals:  Filed Vitals:   10/02/15 1020 10/02/15 1030  BP: 122/32 146/44  Pulse: 72 67  Temp: 36.1 C   Resp: 19 15    Last Pain: There were no vitals filed for this visit.       Complications: No apparent anesthesia complications

## 2015-10-02 NOTE — H&P (Signed)
   Primary Care Physician:  Petra Kuba, MD Primary Gastroenterologist:  Dr. Vira Agar  Pre-Procedure History & Physical: HPI:  Cathy Cline is a 60 y.o. female is here for an colonoscopy.   Past Medical History  Diagnosis Date  . Carotid stenosis   . Diabetes mellitus without complication ([place])   . Hypertension   . Myocardial infarction ([place])   . Tachycardia   . Hypercholesterolemia     Past Surgical History  Procedure Laterality Date  . Cardiac catheterization      Prior to Admission medications   Medication Sig Start Date End Date Taking? Authorizing Provider  aspirin 81 MG tablet Take 81 mg by mouth daily.   Yes Historical Provider, MD  atorvastatin (LIPITOR) 40 MG tablet Take 40 mg by mouth daily.   Yes Historical Provider, MD  insulin detemir (LEVEMIR) 100 UNIT/ML injection Inject into the skin at bedtime.   Yes Historical Provider, MD  losartan (COZAAR) 25 MG tablet Take 25 mg by mouth daily.   Yes Historical Provider, MD  metFORMIN (GLUCOPHAGE) 500 MG tablet Take by mouth 2 (two) times daily with a meal.   Yes Historical Provider, MD  metoprolol tartrate (LOPRESSOR) 25 MG tablet Take 25 mg by mouth 2 (two) times daily.   Yes Historical Provider, MD  pioglitazone (ACTOS) 45 MG tablet Take 45 mg by mouth daily.   Yes Historical Provider, MD    Allergies as of 09/05/2015  . (Not on File)    No family history on file.  Social History   Social History  . Marital Status: Married    Spouse Name: N/A  . Number of Children: N/A  . Years of Education: N/A   Occupational History  . Not on file.   Social History Main Topics  . Smoking status: Never Smoker   . Smokeless tobacco: Not on file  . Alcohol Use: Not on file  . Drug Use: Not on file  . Sexual Activity: Not on file   Other Topics Concern  . Not on file   Social History Narrative    Review of Systems: See HPI, otherwise negative ROS  Physical Exam: Temp(Src) 96.9 F (36.1 C)  (Tympanic)  Resp 18  Ht 4\' 11"  (1.499 m)  Wt 58.514 kg (129 lb)  BMI 26.04 kg/m2  SpO2 98% General:   Alert,  pleasant and cooperative in NAD Head:  Normocephalic and atraumatic. Neck:  Supple; no masses or thyromegaly. Lungs:  Clear throughout to auscultation.    Heart:  Regular rate and rhythm. Abdomen:  Soft, nontender and nondistended. Normal bowel sounds, without guarding, and without rebound.   Neurologic:  Alert and  oriented x4;  grossly normal neurologically.  Impression/Plan: Cathy Cline is here for an colonoscopy to be performed for screening  Risks, benefits, limitations, and alternatives regarding  colonoscopy have been reviewed with the patient.  Questions have been answered.  All parties agreeable.   Gaylyn Cheers, MD  10/02/2015, 9:07 AM

## 2015-10-02 NOTE — Anesthesia Postprocedure Evaluation (Signed)
Anesthesia Post Note  Patient: Cathy Cline  Procedure(s) Performed: Procedure(s) (LRB): COLONOSCOPY WITH PROPOFOL (N/A)  Patient location during evaluation: PACU Anesthesia Type: General Level of consciousness: awake Pain management: satisfactory to patient Vital Signs Assessment: post-procedure vital signs reviewed and stable Respiratory status: nonlabored ventilation Cardiovascular status: stable Anesthetic complications: no    Last Vitals:  Filed Vitals:   10/02/15 1030 10/02/15 1040  BP: 146/44 186/51  Pulse: 67 71  Temp:    Resp: 15 14    Last Pain: There were no vitals filed for this visit.               VAN STAVEREN,Vylette Strubel

## 2015-10-02 NOTE — Op Note (Signed)
[facility] Gastroenterology Patient Name: Cathy Cline Procedure Date: 10/02/2015 9:47 AM MRN: [id number] Account #: [id number] Date of Birth: 04/11/1955 Admit Type: Outpatient Age: 60 Room: [facility] ENDO ROOM 4 Gender: Female Note Status: Finalized Procedure:            Colonoscopy Indications:          Screening for colorectal malignant neoplasm Providers:            Manya Silvas, MD Referring MD:         Sena Hitch, MD (Referring MD) Medicines:            Propofol per Anesthesia Complications:        No immediate complications. Procedure:            Pre-Anesthesia Assessment:                       - After reviewing the risks and benefits, the patient                        was deemed in satisfactory condition to undergo the                        procedure.                       After obtaining informed consent, the colonoscope was                        passed under direct vision. Throughout the procedure,                        the patient's blood pressure, pulse, and oxygen                        saturations were monitored continuously. The                        Colonoscope was introduced through the anus and                        advanced to the the cecum, identified by appendiceal                        orifice and ileocecal valve. The colonoscopy was                        performed without difficulty. The patient tolerated the                        procedure well. The quality of the bowel preparation                        was excellent. Findings:      A small polyp was found in the cecum. The polyp was sessile. The polyp       was removed with a hot snare. Resection and retrieval were complete. To       prevent bleeding after the polypectomy, one hemostatic clip was       successfully placed. There was no bleeding during, or at the end, of the       procedure.  Two sessile polyps were found in the splenic flexure and hepatic        flexure. The polyps were diminutive in size. These polyps were removed       with a jumbo cold forceps. Resection and retrieval were complete.      Internal hemorrhoids were found during endoscopy. The hemorrhoids were       small and Grade I (internal hemorrhoids that do not prolapse). Impression:           - One small polyp in the cecum, removed with a hot                        snare. Resected and retrieved. Clip was placed.                       - Two diminutive polyps at the splenic flexure and at                        the hepatic flexure, removed with a jumbo cold forceps.                        Resected and retrieved.                       - Internal hemorrhoids. Recommendation:       - Await pathology results. Manya Silvas, MD 10/02/2015 10:17:13 AM This report has been signed electronically. Number of Addenda: 0 Note Initiated On: 10/02/2015 9:47 AM Scope Withdrawal Time: 0 hours 14 minutes 6 seconds  Total Procedure Duration: 0 hours 18 minutes 4 seconds       [facility]

## 2015-10-02 NOTE — Anesthesia Preprocedure Evaluation (Addendum)
Anesthesia Evaluation  Patient identified by MRN, date of birth, ID band Patient awake    Reviewed: Allergy & Precautions, NPO status   History of Anesthesia Complications Negative for: history of anesthetic complications  Airway Mallampati: II       Dental  (+) Upper Dentures, Lower Dentures   Pulmonary neg pulmonary ROS,    breath sounds clear to auscultation       Cardiovascular Exercise Tolerance: Good hypertension, Pt. on home beta blockers + Past MI   Rhythm:Regular     Neuro/Psych    GI/Hepatic negative GI ROS, Neg liver ROS,   Endo/Other  diabetes, Type 1, Insulin Dependent  Renal/GU negative Renal ROS     Musculoskeletal   Abdominal Normal abdominal exam  (+)   Peds  Hematology   Anesthesia Other Findings   Reproductive/Obstetrics                            Anesthesia Physical Anesthesia Plan  ASA: III  Anesthesia Plan: General   Post-op Pain Management:    Induction: Intravenous  Airway Management Planned: Natural Airway and Nasal Cannula  Additional Equipment:   Intra-op Plan:   Post-operative Plan:   Informed Consent: I have reviewed the patients History and Physical, chart, labs and discussed the procedure including the risks, benefits and alternatives for the proposed anesthesia with the patient or authorized representative who has indicated his/her understanding and acceptance.     Plan Discussed with: CRNA  Anesthesia Plan Comments:         Anesthesia Quick Evaluation

## 2015-10-02 NOTE — Anesthesia Procedure Notes (Signed)
Performed by: Vaughan Sine Pre-anesthesia Checklist: Patient identified, Emergency Drugs available, Suction available, Patient being monitored and Timeout performed Patient Re-evaluated:Patient Re-evaluated prior to inductionOxygen Delivery Method: Nasal cannula Preoxygenation: Pre-oxygenation with 100% oxygen Intubation Type: IV induction Ventilation: Oral airway inserted - appropriate to patient size Placement Confirmation: positive ETCO2 and CO2 detector

## 2015-10-03 LAB — SURGICAL PATHOLOGY

## 2015-10-05 ENCOUNTER — Encounter: Payer: Self-pay | Admitting: Unknown Physician Specialty

## 2015-11-26 ENCOUNTER — Other Ambulatory Visit: Payer: Self-pay | Admitting: Family Medicine

## 2015-11-26 DIAGNOSIS — Z1231 Encounter for screening mammogram for malignant neoplasm of breast: Secondary | ICD-10-CM

## 2015-12-24 ENCOUNTER — Ambulatory Visit: Payer: BLUE CROSS/BLUE SHIELD | Attending: Family Medicine

## 2016-01-16 ENCOUNTER — Ambulatory Visit
Admission: RE | Admit: 2016-01-16 | Discharge: 2016-01-16 | Disposition: A | Discharge status: Home / Self Care | Payer: BLUE CROSS/BLUE SHIELD | Source: Ambulatory Visit | Attending: Family Medicine | Admitting: Family Medicine

## 2016-01-16 DIAGNOSIS — Z1231 Encounter for screening mammogram for malignant neoplasm of breast: Secondary | ICD-10-CM | POA: Diagnosis present

## 2017-09-01 ENCOUNTER — Other Ambulatory Visit: Payer: Self-pay | Admitting: Family Medicine

## 2017-09-01 DIAGNOSIS — R918 Other nonspecific abnormal finding of lung field: Secondary | ICD-10-CM

## 2018-02-14 ENCOUNTER — Encounter: Payer: Self-pay | Admitting: Emergency Medicine

## 2018-02-14 ENCOUNTER — Inpatient Hospital Stay
Admission: EM | Admit: 2018-02-14 | Discharge: 2018-02-24 | DRG: 871 | Disposition: A | Discharge status: Home / Self Care | Payer: 59 | Attending: Internal Medicine | Admitting: Internal Medicine

## 2018-02-14 ENCOUNTER — Emergency Department: Payer: 59

## 2018-02-14 ENCOUNTER — Other Ambulatory Visit: Payer: Self-pay

## 2018-02-14 DIAGNOSIS — E871 Hypo-osmolality and hyponatremia: Secondary | ICD-10-CM | POA: Diagnosis present

## 2018-02-14 DIAGNOSIS — N10 Acute tubulo-interstitial nephritis: Secondary | ICD-10-CM | POA: Diagnosis present

## 2018-02-14 DIAGNOSIS — E875 Hyperkalemia: Secondary | ICD-10-CM | POA: Diagnosis present

## 2018-02-14 DIAGNOSIS — N171 Acute kidney failure with acute cortical necrosis: Secondary | ICD-10-CM | POA: Diagnosis not present

## 2018-02-14 DIAGNOSIS — J9600 Acute respiratory failure, unspecified whether with hypoxia or hypercapnia: Secondary | ICD-10-CM | POA: Diagnosis not present

## 2018-02-14 DIAGNOSIS — E1165 Type 2 diabetes mellitus with hyperglycemia: Secondary | ICD-10-CM | POA: Diagnosis present

## 2018-02-14 DIAGNOSIS — I129 Hypertensive chronic kidney disease with stage 1 through stage 4 chronic kidney disease, or unspecified chronic kidney disease: Secondary | ICD-10-CM | POA: Diagnosis present

## 2018-02-14 DIAGNOSIS — J9601 Acute respiratory failure with hypoxia: Secondary | ICD-10-CM | POA: Diagnosis present

## 2018-02-14 DIAGNOSIS — I6529 Occlusion and stenosis of unspecified carotid artery: Secondary | ICD-10-CM | POA: Diagnosis present

## 2018-02-14 DIAGNOSIS — A4151 Sepsis due to Escherichia coli [E. coli]: Principal | ICD-10-CM | POA: Diagnosis present

## 2018-02-14 DIAGNOSIS — J939 Pneumothorax, unspecified: Secondary | ICD-10-CM

## 2018-02-14 DIAGNOSIS — J9811 Atelectasis: Secondary | ICD-10-CM | POA: Diagnosis present

## 2018-02-14 DIAGNOSIS — K8001 Calculus of gallbladder with acute cholecystitis with obstruction: Secondary | ICD-10-CM | POA: Diagnosis present

## 2018-02-14 DIAGNOSIS — J189 Pneumonia, unspecified organism: Secondary | ICD-10-CM | POA: Diagnosis present

## 2018-02-14 DIAGNOSIS — J810 Acute pulmonary edema: Secondary | ICD-10-CM | POA: Diagnosis present

## 2018-02-14 DIAGNOSIS — K810 Acute cholecystitis: Secondary | ICD-10-CM

## 2018-02-14 DIAGNOSIS — E7800 Pure hypercholesterolemia, unspecified: Secondary | ICD-10-CM | POA: Diagnosis present

## 2018-02-14 DIAGNOSIS — N170 Acute kidney failure with tubular necrosis: Secondary | ICD-10-CM | POA: Diagnosis present

## 2018-02-14 DIAGNOSIS — E785 Hyperlipidemia, unspecified: Secondary | ICD-10-CM | POA: Diagnosis present

## 2018-02-14 DIAGNOSIS — N184 Chronic kidney disease, stage 4 (severe): Secondary | ICD-10-CM | POA: Diagnosis present

## 2018-02-14 DIAGNOSIS — N19 Unspecified kidney failure: Secondary | ICD-10-CM

## 2018-02-14 DIAGNOSIS — I10 Essential (primary) hypertension: Secondary | ICD-10-CM | POA: Diagnosis not present

## 2018-02-14 DIAGNOSIS — D696 Thrombocytopenia, unspecified: Secondary | ICD-10-CM | POA: Diagnosis present

## 2018-02-14 DIAGNOSIS — K5900 Constipation, unspecified: Secondary | ICD-10-CM | POA: Diagnosis present

## 2018-02-14 DIAGNOSIS — E8770 Fluid overload, unspecified: Secondary | ICD-10-CM | POA: Diagnosis present

## 2018-02-14 DIAGNOSIS — Z7982 Long term (current) use of aspirin: Secondary | ICD-10-CM

## 2018-02-14 DIAGNOSIS — R945 Abnormal results of liver function studies: Secondary | ICD-10-CM

## 2018-02-14 DIAGNOSIS — N186 End stage renal disease: Secondary | ICD-10-CM | POA: Diagnosis not present

## 2018-02-14 DIAGNOSIS — R140 Abdominal distension (gaseous): Secondary | ICD-10-CM

## 2018-02-14 DIAGNOSIS — E1129 Type 2 diabetes mellitus with other diabetic kidney complication: Secondary | ICD-10-CM | POA: Diagnosis not present

## 2018-02-14 DIAGNOSIS — E1122 Type 2 diabetes mellitus with diabetic chronic kidney disease: Secondary | ICD-10-CM | POA: Diagnosis present

## 2018-02-14 DIAGNOSIS — E876 Hypokalemia: Secondary | ICD-10-CM | POA: Diagnosis present

## 2018-02-14 DIAGNOSIS — Z452 Encounter for adjustment and management of vascular access device: Secondary | ICD-10-CM

## 2018-02-14 DIAGNOSIS — R6521 Severe sepsis with septic shock: Secondary | ICD-10-CM | POA: Diagnosis present

## 2018-02-14 DIAGNOSIS — I509 Heart failure, unspecified: Secondary | ICD-10-CM

## 2018-02-14 DIAGNOSIS — E872 Acidosis: Secondary | ICD-10-CM | POA: Diagnosis present

## 2018-02-14 DIAGNOSIS — N12 Tubulo-interstitial nephritis, not specified as acute or chronic: Secondary | ICD-10-CM | POA: Diagnosis present

## 2018-02-14 DIAGNOSIS — Z818 Family history of other mental and behavioral disorders: Secondary | ICD-10-CM

## 2018-02-14 DIAGNOSIS — N179 Acute kidney failure, unspecified: Secondary | ICD-10-CM

## 2018-02-14 DIAGNOSIS — R7989 Other specified abnormal findings of blood chemistry: Secondary | ICD-10-CM

## 2018-02-14 DIAGNOSIS — K7589 Other specified inflammatory liver diseases: Secondary | ICD-10-CM | POA: Diagnosis present

## 2018-02-14 DIAGNOSIS — I2510 Atherosclerotic heart disease of native coronary artery without angina pectoris: Secondary | ICD-10-CM | POA: Diagnosis present

## 2018-02-14 DIAGNOSIS — A419 Sepsis, unspecified organism: Secondary | ICD-10-CM | POA: Diagnosis present

## 2018-02-14 DIAGNOSIS — T380X5A Adverse effect of glucocorticoids and synthetic analogues, initial encounter: Secondary | ICD-10-CM | POA: Diagnosis present

## 2018-02-14 DIAGNOSIS — B9620 Unspecified Escherichia coli [E. coli] as the cause of diseases classified elsewhere: Secondary | ICD-10-CM | POA: Diagnosis present

## 2018-02-14 DIAGNOSIS — I252 Old myocardial infarction: Secondary | ICD-10-CM

## 2018-02-14 LAB — CBC
HCT: 27.1 % — ABNORMAL LOW (ref 36.0–46.0)
Hemoglobin: 8.9 g/dL — ABNORMAL LOW (ref 12.0–15.0)
MCH: 29 pg (ref 26.0–34.0)
MCHC: 32.8 g/dL (ref 30.0–36.0)
MCV: 88.3 fL (ref 80.0–100.0)
NRBC: 0 % (ref 0.0–0.2)
Platelets: 314 10*3/uL (ref 150–400)
RBC: 3.07 MIL/uL — AB (ref 3.87–5.11)
RDW: 14.5 % (ref 11.5–15.5)
WBC: 28.7 10*3/uL — AB (ref 4.0–10.5)

## 2018-02-14 LAB — GLUCOSE, CAPILLARY: Glucose-Capillary: 90 mg/dL (ref 70–99)

## 2018-02-14 LAB — BASIC METABOLIC PANEL
Anion gap: 16 — ABNORMAL HIGH (ref 5–15)
BUN: 78 mg/dL — AB (ref 8–23)
CHLORIDE: 95 mmol/L — AB (ref 98–111)
CO2: 19 mmol/L — ABNORMAL LOW (ref 22–32)
Calcium: 8.1 mg/dL — ABNORMAL LOW (ref 8.9–10.3)
Creatinine, Ser: 5.3 mg/dL — ABNORMAL HIGH (ref 0.44–1.00)
GFR calc Af Amer: 9 mL/min — ABNORMAL LOW (ref >60)
GFR calc non Af Amer: 8 mL/min — ABNORMAL LOW (ref >60)
GLUCOSE: 148 mg/dL — AB (ref 70–99)
Potassium: 5.1 mmol/L (ref 3.5–5.1)
SODIUM: 130 mmol/L — AB (ref 135–145)

## 2018-02-14 LAB — URINALYSIS, COMPLETE (UACMP) WITH MICROSCOPIC
Bilirubin Urine: NEGATIVE
GLUCOSE, UA: NEGATIVE mg/dL
Ketones, ur: NEGATIVE mg/dL
Nitrite: NEGATIVE
Protein, ur: 100 mg/dL — AB
RBC / HPF: >50 RBC/hpf — ABNORMAL HIGH (ref 0–5)
SPECIFIC GRAVITY, URINE: 1.011 (ref 1.005–1.030)
WBC, UA: >50 WBC/hpf — ABNORMAL HIGH (ref 0–5)
pH: 5 (ref 5.0–8.0)

## 2018-02-14 LAB — HEMOGLOBIN A1C
Hgb A1c MFr Bld: 9.4 % — ABNORMAL HIGH (ref 4.8–5.6)
Mean Plasma Glucose: 223.08 mg/dL

## 2018-02-14 LAB — LACTIC ACID, PLASMA
LACTIC ACID, VENOUS: 3.7 mmol/L — AB (ref 0.5–1.9)
Lactic Acid, Venous: 3.9 mmol/L (ref 0.5–1.9)

## 2018-02-14 LAB — TSH: TSH: 2.472 u[IU]/mL (ref 0.350–4.500)

## 2018-02-14 MED ORDER — ONDANSETRON HCL 4 MG/2ML IJ SOLN: 4.0000 mg | Freq: Four times a day (QID) | INTRAMUSCULAR | Status: DC | PRN

## 2018-02-14 MED ORDER — ATORVASTATIN CALCIUM 20 MG PO TABS
40.0000 mg | ORAL_TABLET | Freq: Every day | ORAL | Status: DC
  Administered 2018-02-16: 40 mg via ORAL
  Filled 2018-02-14: qty 2

## 2018-02-14 MED ORDER — ACETAMINOPHEN 650 MG RE SUPP: 650.0000 mg | Freq: Four times a day (QID) | RECTAL | Status: DC | PRN

## 2018-02-14 MED ORDER — METOPROLOL TARTRATE 25 MG PO TABS
25.0000 mg | ORAL_TABLET | Freq: Two times a day (BID) | ORAL | Status: DC
  Administered 2018-02-17 – 2018-02-24 (×13): 25 mg via ORAL
  Filled 2018-02-14 (×15): qty 1

## 2018-02-14 MED ORDER — HYDROCHLOROTHIAZIDE 25 MG PO TABS
25.0000 mg | ORAL_TABLET | Freq: Every day | ORAL | Status: DC
  Filled 2018-02-14: qty 1

## 2018-02-14 MED ORDER — ONDANSETRON HCL 4 MG/2ML IJ SOLN
4.0000 mg | Freq: Once | INTRAMUSCULAR | Status: AC
  Administered 2018-02-14: 4 mg via INTRAVENOUS
  Filled 2018-02-14: qty 2

## 2018-02-14 MED ORDER — MORPHINE SULFATE (PF) 4 MG/ML IV SOLN
4.0000 mg | Freq: Once | INTRAVENOUS | Status: AC
  Administered 2018-02-14: 4 mg via INTRAVENOUS
  Filled 2018-02-14: qty 1

## 2018-02-14 MED ORDER — SODIUM CHLORIDE 0.9 % IV SOLN
1.0000 g | Freq: Once | INTRAVENOUS | Status: AC
  Administered 2018-02-14: 1 g via INTRAVENOUS
  Filled 2018-02-14: qty 10

## 2018-02-14 MED ORDER — SODIUM CHLORIDE 0.9 % IV BOLUS
1000.0000 mL | Freq: Once | INTRAVENOUS | Status: AC
  Administered 2018-02-14: 1000 mL via INTRAVENOUS

## 2018-02-14 MED ORDER — INSULIN GLARGINE 100 UNIT/ML ~~LOC~~ SOLN
28.0000 [IU] | Freq: Every day | SUBCUTANEOUS | Status: DC
  Administered 2018-02-17 – 2018-02-18 (×2): 28 [IU] via SUBCUTANEOUS
  Filled 2018-02-14 (×5): qty 0.28

## 2018-02-14 MED ORDER — LOSARTAN POTASSIUM-HCTZ 100-25 MG PO TABS: 1.0000 | ORAL_TABLET | Freq: Every day | ORAL | Status: DC

## 2018-02-14 MED ORDER — ACETAMINOPHEN 325 MG PO TABS
650.0000 mg | ORAL_TABLET | Freq: Four times a day (QID) | ORAL | Status: DC | PRN
  Administered 2018-02-15: 650 mg via ORAL
  Filled 2018-02-14: qty 2

## 2018-02-14 MED ORDER — ASPIRIN EC 81 MG PO TBEC
81.0000 mg | DELAYED_RELEASE_TABLET | Freq: Every day | ORAL | Status: DC
  Administered 2018-02-15 – 2018-02-24 (×9): 81 mg via ORAL
  Filled 2018-02-14 (×10): qty 1

## 2018-02-14 MED ORDER — LORATADINE 10 MG PO TABS
10.0000 mg | ORAL_TABLET | Freq: Every day | ORAL | Status: DC
  Administered 2018-02-17 – 2018-02-24 (×7): 10 mg via ORAL
  Filled 2018-02-14 (×8): qty 1

## 2018-02-14 MED ORDER — ONDANSETRON HCL 4 MG PO TABS: 4.0000 mg | ORAL_TABLET | Freq: Four times a day (QID) | ORAL | Status: DC | PRN

## 2018-02-14 MED ORDER — LOSARTAN POTASSIUM 50 MG PO TABS: 100.0000 mg | ORAL_TABLET | Freq: Every day | ORAL | Status: DC

## 2018-02-14 MED ORDER — AMLODIPINE BESYLATE 5 MG PO TABS
2.5000 mg | ORAL_TABLET | Freq: Every day | ORAL | Status: DC
  Filled 2018-02-14: qty 1

## 2018-02-14 MED ORDER — SODIUM CHLORIDE 0.9 % IV SOLN
INTRAVENOUS | Status: DC
  Administered 2018-02-14 – 2018-02-15 (×3): via INTRAVENOUS

## 2018-02-14 MED ORDER — HEPARIN SODIUM (PORCINE) 5000 UNIT/ML IJ SOLN
5000.0000 [IU] | Freq: Three times a day (TID) | INTRAMUSCULAR | Status: DC
  Administered 2018-02-15 – 2018-02-16 (×3): 5000 [IU] via SUBCUTANEOUS
  Filled 2018-02-14 (×3): qty 1

## 2018-02-14 MED ORDER — DOCUSATE SODIUM 100 MG PO CAPS
100.0000 mg | ORAL_CAPSULE | Freq: Two times a day (BID) | ORAL | Status: DC
  Administered 2018-02-15 – 2018-02-24 (×15): 100 mg via ORAL
  Filled 2018-02-14 (×15): qty 1

## 2018-02-14 MED ORDER — INSULIN ASPART 100 UNIT/ML ~~LOC~~ SOLN
0.0000 [IU] | Freq: Three times a day (TID) | SUBCUTANEOUS | Status: DC
  Administered 2018-02-15 – 2018-02-18 (×5): 2 [IU] via SUBCUTANEOUS
  Administered 2018-02-18: 3 [IU] via SUBCUTANEOUS
  Administered 2018-02-18: 5 [IU] via SUBCUTANEOUS
  Filled 2018-02-14 (×7): qty 1

## 2018-02-14 MED ORDER — PIPERACILLIN-TAZOBACTAM 3.375 G IVPB
3.3750 g | Freq: Two times a day (BID) | INTRAVENOUS | Status: DC
  Administered 2018-02-14 – 2018-02-15 (×2): 3.375 g via INTRAVENOUS
  Filled 2018-02-14 (×4): qty 50

## 2018-02-14 NOTE — Progress Notes (Signed)
Pharmacy Antibiotic Note  Cathy Cline is a 62 y.o. female admitted on 02/14/2018 with intra abdominal.  Pharmacy has been consulted for Zosyn  dosing.  Plan: Zosyn 3.375 gm IV Q12H   Height: 4\' 11"  (149.9 cm) Weight: 105 lb (47.6 kg) IBW/kg (Calculated) : 43.2  Temp (24hrs), Avg:98.1 F (36.7 C), Min:98.1 F (36.7 C), Max:98.1 F (36.7 C)  Recent Labs  Lab 02/14/18 1844  WBC 28.7*  CREATININE 5.30*  LATICACIDVEN 3.7*    Estimated Creatinine Clearance: 7.6 mL/min (A) (by C-G formula based on SCr of 5.3 mg/dL (H)).    No Known Allergies  Antimicrobials this admission:   >>    >>   Dose adjustments this admission:   Microbiology results:  BCx:   UCx:    Sputum:    MRSA PCR:   Thank you for allowing pharmacy to be a part of this patient's care.  Lucill Mauck D 02/14/2018 9:24 PM

## 2018-02-14 NOTE — H&P (Signed)
Cathy Cline is an 62 y.o. female.   Chief Complaint: Abnormal labs HPI: The patient with past medical history of hypertension, CAD and diabetes presents to the emergency department complaining of abdominal pain.  She was also told to be evaluated in the emergency department due to abnormal outpatient labs.  The patient reports nonbloody nonbilious nausea and vomiting.  She was found to have severe acute kidney injury.  CT of her abdomen showed perinephric stranding consistent with pyelonephritis.  The patient was given IV antibiotics prior to the emergency department staff calling the hospitalist service for admission.  Past Medical History:  Diagnosis Date  . Carotid stenosis   . Diabetes mellitus without complication ([place])   . Hypercholesterolemia   . Hypertension   . Myocardial infarction ([place])   . Tachycardia     Past Surgical History:  Procedure Laterality Date  . CARDIAC CATHETERIZATION    . COLONOSCOPY WITH PROPOFOL N/A 10/02/2015   Procedure: COLONOSCOPY WITH PROPOFOL;  Surgeon: Manya Silvas, MD;  Location: [facility] ENDOSCOPY;  Service: Endoscopy;  Laterality: N/A;  . KNEE SURGERY    . TUBAL LIGATION      No family history on file. DM 2 in multiple family members  Social History:  reports that she has never smoked. She has never used smokeless tobacco. She reports that she does not drink alcohol or use drugs.  Allergies: No Known Allergies  Medications Prior to Admission  Medication Sig Dispense Refill  . amLODipine (NORVASC) 2.5 MG tablet Take 2.5 mg by mouth daily.    Marland Kitchen aspirin 81 MG tablet Take 81 mg by mouth daily.    Marland Kitchen atorvastatin (LIPITOR) 40 MG tablet Take 40 mg by mouth at bedtime.     . cetirizine (ZYRTEC) 10 MG tablet Take 10 mg by mouth at bedtime.    . insulin detemir (LEVEMIR) 100 UNIT/ML injection Inject 20-25 Units into the skin See admin instructions. Inject 25u under the skin every morning and inject 20u under the skin ever evening    .  losartan-hydrochlorothiazide (HYZAAR) 100-25 MG tablet Take 1 tablet by mouth daily.    . metFORMIN (GLUCOPHAGE-XR) 500 MG 24 hr tablet Take 1,000 mg by mouth 2 (two) times daily.    . metoprolol tartrate (LOPRESSOR) 25 MG tablet Take 25 mg by mouth 2 (two) times daily.      Results for orders placed or performed during the hospital encounter of 02/14/18 (from the past 48 hour(s))  Urinalysis, Complete w Microscopic     Status: Abnormal   Collection Time: 02/14/18  6:44 PM  Result Value Ref Range   Color, Urine YELLOW (A) YELLOW   APPearance CLOUDY (A) CLEAR   Specific Gravity, Urine 1.011 1.005 - 1.030   pH 5.0 5.0 - 8.0   Glucose, UA NEGATIVE NEGATIVE mg/dL   Hgb urine dipstick LARGE (A) NEGATIVE   Bilirubin Urine NEGATIVE NEGATIVE   Ketones, ur NEGATIVE NEGATIVE mg/dL   Protein, ur 100 (A) NEGATIVE mg/dL   Nitrite NEGATIVE NEGATIVE   Leukocytes, UA LARGE (A) NEGATIVE   RBC / HPF >50 (H) 0 - 5 RBC/hpf   WBC, UA >50 (H) 0 - 5 WBC/hpf   Bacteria, UA MANY (A) NONE SEEN   Squamous Epithelial / LPF 0-5 0 - 5   WBC Clumps PRESENT    Mucus PRESENT     Comment: Performed at [facility], [address], [place], [place] [address]  Basic metabolic panel     Status: Abnormal  Collection Time: 02/14/18  6:44 PM  Result Value Ref Range   Sodium 130 (L) 135 - 145 mmol/L   Potassium 5.1 3.5 - 5.1 mmol/L   Chloride 95 (L) 98 - 111 mmol/L   CO2 19 (L) 22 - 32 mmol/L   Glucose, Bld 148 (H) 70 - 99 mg/dL   BUN 78 (H) 8 - 23 mg/dL   Creatinine, Ser 5.30 (H) 0.44 - 1.00 mg/dL   Calcium 8.1 (L) 8.9 - 10.3 mg/dL   GFR calc non Af Amer 8 (L) >60 mL/min   GFR calc Af Amer 9 (L) >60 mL/min   Anion gap 16 (H) 5 - 15    Comment: Performed at [facility], [address] [place], [place] [address]  CBC     Status: Abnormal   Collection Time: 02/14/18  6:44 PM  Result Value Ref Range   WBC 28.7 (H) 4.0 - 10.5 K/uL    Comment: WHITE COUNT CONFIRMED ON SMEAR   RBC 3.07 (L)  3.87 - 5.11 MIL/uL   Hemoglobin 8.9 (L) 12.0 - 15.0 g/dL   HCT 27.1 (L) 36.0 - 46.0 %   MCV 88.3 80.0 - 100.0 fL   MCH 29.0 26.0 - 34.0 pg   MCHC 32.8 30.0 - 36.0 g/dL   RDW 14.5 11.5 - 15.5 %   Platelets 314 150 - 400 K/uL   nRBC 0.0 0.0 - 0.2 %    Comment: Performed at [facility], [address] [place], [place] [address]  Lactic acid, plasma     Status: Abnormal   Collection Time: 02/14/18  6:44 PM  Result Value Ref Range   Lactic Acid, Venous 3.7 (HH) 0.5 - 1.9 mmol/L    Comment: CRITICAL RESULT CALLED TO, READ BACK BY AND VERIFIED WITH STEPHEN JONES @1949  02/14/18 AKT Performed at [facility], [address] [place], [place] [address]   Glucose, capillary     Status: None   Collection Time: 02/14/18 10:07 PM  Result Value Ref Range   Glucose-Capillary 90 70 - 99 mg/dL   Comment 1 Notify RN    Ct Renal Stone Study  Result Date: 02/14/2018 CLINICAL DATA:  62 y/o F; leukocytosis and elevated creatinine. Left flank pain. Nausea and vomiting. EXAM: CT ABDOMEN AND PELVIS WITHOUT CONTRAST TECHNIQUE: Multidetector CT imaging of the abdomen and pelvis was performed following the standard protocol without IV contrast. COMPARISON:  10/02/2013 abdominal ultrasound. FINDINGS: Lower chest: Right lung base atelectasis. Trace left effusion. Mild coronary artery calcific atherosclerosis. Hepatobiliary: No focal liver abnormality is seen. Cholelithiasis. No gallbladder wall thickening. No biliary ductal dilatation. Pancreas: Unremarkable. No pancreatic ductal dilatation or surrounding inflammatory changes. Spleen: Normal in size without focal abnormality. Adrenals/Urinary Tract: Normal adrenal glands. Normal right kidney. No urinary stone disease identified. The left kidney is enlarged, there is extensive left-sided perinephric stranding, and there is mild left hydronephrosis. No obstructing stone or mass identified. Bladder wall thickening. Stomach/Bowel: Stomach is within  normal limits. Appendix appears normal. No evidence of bowel wall thickening, distention, or inflammatory changes. Vascular/Lymphatic: Aortic atherosclerosis. No enlarged abdominal or pelvic lymph nodes. Reproductive: Uterus and bilateral adnexa are unremarkable. Other: No abdominal wall hernia or abnormality. No abdominopelvic ascites. Musculoskeletal: L5-S1 grade 1 anterolisthesis and chronic L5 pars defects. No acute fracture identified. IMPRESSION: 1. Enlarged left kidney, left perinephric stranding, and mild left hydronephrosis. Bladder wall thickening. No obstructing stone or mass identified. Findings probably represent infection of the renal collecting system, less likely recently  passed stone. 2. Cholelithiasis. 3. Mild coronary artery and aortic calcific atherosclerosis. Electronically Signed   By: Kristine Garbe M.D.   On: 02/14/2018 20:11    Review of Systems  Constitutional: Positive for chills. Negative for fever.  HENT: Negative for sore throat and tinnitus.   Eyes: Negative for blurred vision and redness.  Respiratory: Negative for cough and shortness of breath.   Cardiovascular: Negative for chest pain, palpitations, orthopnea and PND.  Gastrointestinal: Positive for abdominal pain, nausea and vomiting. Negative for diarrhea.  Genitourinary: Negative for dysuria, frequency and urgency.  Musculoskeletal: Negative for joint pain and myalgias.  Skin: Negative for rash.       No lesions  Neurological: Negative for speech change, focal weakness and weakness.  Endo/Heme/Allergies: Does not bruise/bleed easily.       No temperature intolerance  Psychiatric/Behavioral: Negative for depression and suicidal ideas.    Blood pressure (!) 116/46, pulse 92, temperature 98.2 F (36.8 C), temperature source Oral, resp. rate (!) 24, height 4\' 11"  (1.499 m), weight 47.6 kg, SpO2 99 %. Physical Exam  Vitals reviewed. Constitutional: She is oriented to person, place, and time. She  appears well-developed and well-nourished. No distress.  HENT:  Head: Normocephalic and atraumatic.  Mouth/Throat: Oropharynx is clear and moist.  Eyes: Pupils are equal, round, and reactive to light. Conjunctivae and EOM are normal. No scleral icterus.  Neck: Normal range of motion. Neck supple. No JVD present. No tracheal deviation present. No thyromegaly present.  Cardiovascular: Normal rate, regular rhythm and normal heart sounds. Exam reveals no gallop and no friction rub.  No murmur heard. Respiratory: Effort normal and breath sounds normal.  GI: Soft. Bowel sounds are normal. She exhibits no distension and no mass. There is tenderness. There is no rebound and no guarding.  Genitourinary:  Genitourinary Comments: Deferred  Musculoskeletal: Normal range of motion. She exhibits no edema.  Lymphadenopathy:    She has no cervical adenopathy.  Neurological: She is alert and oriented to person, place, and time. No cranial nerve deficit. She exhibits normal muscle tone.  Skin: Skin is warm and dry. No rash noted. No erythema.  Psychiatric: She has a normal mood and affect. Her behavior is normal. Judgment and thought content normal.     Assessment/Plan This is a 63 year old female admitted for acute kidney injury. 1.  Acute kidney injury: Secondary to infection and hydronephrosis.  Avoid nephrotoxic agents.  Hydrate with intravenous fluid.  Nephrology if not resolve in a timely fashion 2.  Pyelonephritis: Given ceftriaxone in the emergency department.  I started the patient on Zosyn. 3.  Sepsis: Patient meets criteria via leukocytosis, tachypnea and elevated lactic acid.  Aggressively hydrate with normal saline.  The patient is hemodynamically stable.  Follow blood cultures and urine cultures for growth and sensitivity 4.  Diabetes mellitus type 2: Hold metformin.  Continue basal insulin therapy.  Sliding scale insulin as well 5.  Hypertension: Controlled; continue Toprol and  hydrochlorothiazide.  Hold losartan for now 6.  CAD: Table; continue aspirin 7.  Hyperlipidemia: Continue statin therapy 8.  DVT prophylaxis: Heparin 9.  GI prophylaxis: None The patient is a full code.  Time spent on admission orders and patient care approximately 45 minutes  Harrie Foreman, MD 02/14/2018, 10:13 PM

## 2018-02-14 NOTE — ED Triage Notes (Signed)
Pt in via POV, sent over per PCP due to elevated creatinine and WBC.  Pt reports left flank pain w/ N/V since Saturday.  Vitals WDL, NAD noted at this time.

## 2018-02-14 NOTE — ED Notes (Signed)
Lab notified to add on Urine Culture. 

## 2018-02-14 NOTE — ED Notes (Signed)
MD and interpreter at bedside.

## 2018-02-14 NOTE — ED Notes (Signed)
Date and time results received: 02/14/18 7:51 PM (use smartphrase ".now" to insert current time)  Test: Lactic Acid Critical Value: 3.7  Name of Provider Notified: Dr. Kerman Passey  Orders Received? Or Actions Taken?: No new orders at this time.

## 2018-02-14 NOTE — ED Notes (Signed)
Hospitalist to bedside at this time 

## 2018-02-14 NOTE — ED Provider Notes (Signed)
[facility] Emergency Department Provider Note  Time seen: 7:39 PM  I have reviewed the triage vital signs and the nursing notes.   HISTORY  Chief Complaint Abnormal Lab    HPI Cathy Cline is a 62 y.o. female with a past medical history of diabetes, hypertension, hyperlipidemia, MI, presents to the emergency department for left flank pain and abnormal lab work.  According to the patient over the past 4 days she has been experiencing pain in her left flank along with nausea and vomiting.  Patient saw her doctor yesterday for the pain had blood work performed and was called back today saying that her kidney function was low and she needed to go to the emergency department.  Patient denies any dysuria or hematuria.  Denies any fever.  Describes her pain as an 8/10 dull pain in the left side of her abdomen radiating to her left mid back.   Past Medical History:  Diagnosis Date  . Carotid stenosis   . Diabetes mellitus without complication ([place])   . Hypercholesterolemia   . Hypertension   . Myocardial infarction ([place])   . Tachycardia     There are no active problems to display for this patient.   Past Surgical History:  Procedure Laterality Date  . CARDIAC CATHETERIZATION    . COLONOSCOPY WITH PROPOFOL N/A 10/02/2015   Procedure: COLONOSCOPY WITH PROPOFOL;  Surgeon: Manya Silvas, MD;  Location: [facility] ENDOSCOPY;  Service: Endoscopy;  Laterality: N/A;  . KNEE SURGERY    . TUBAL LIGATION      Prior to Admission medications   Medication Sig Start Date End Date Taking? Authorizing Provider  aspirin 81 MG tablet Take 81 mg by mouth daily.    [provider]  atorvastatin (LIPITOR) 40 MG tablet Take 40 mg by mouth daily.    [provider]  insulin detemir (LEVEMIR) 100 UNIT/ML injection Inject into the skin at bedtime.    [provider]  losartan (COZAAR) 25 MG tablet Take 25 mg by mouth daily.    [provider]  metFORMIN (GLUCOPHAGE) 500 MG tablet Take by mouth 2 (two) times daily with a meal.    [provider]  metoprolol tartrate (LOPRESSOR) 25 MG tablet Take 25 mg by mouth 2 (two) times daily.    [provider]  pioglitazone (ACTOS) 45 MG tablet Take 45 mg by mouth daily.    [provider]    No Known Allergies  No family history on file.  Social History Social History   Tobacco Use  . Smoking status: Never Smoker  . Smokeless tobacco: Never Used  Substance Use Topics  . Alcohol use: No  . Drug use: No    Review of Systems Constitutional: Negative for fever. Cardiovascular: Negative for chest pain. Respiratory: Negative for shortness of breath. Gastrointestinal: Left-sided abdominal pain.  Positive for nausea vomiting Genitourinary: Negative for urinary compaints Musculoskeletal: Negative for musculoskeletal complaints Skin: Negative for skin complaints  Neurological: Negative for headache All other ROS negative  ____________________________________________   PHYSICAL EXAM:  VITAL SIGNS: ED Triage Vitals  Enc Vitals Group     BP 02/14/18 1830 132/73     Pulse Rate 02/14/18 1830 98     Resp 02/14/18 1830 16     Temp 02/14/18 1830 98.1 F (36.7 C)     Temp Source 02/14/18 1830 Oral     SpO2 02/14/18 1830 100 %     Weight 02/14/18 1834 105 lb (  47.6 kg)     Height 02/14/18 1834 4\' 11"  (1.499 m)     Head Circumference --      Peak Flow --      Pain Score 02/14/18 1831 8     Pain Loc --      Pain Edu? --      Excl. in [place]? --    Constitutional: Alert and oriented. Well appearing and in no distress. Eyes: Normal exam ENT   Head: Normocephalic and atraumatic.   Mouth/Throat: Mucous membranes are moist. Cardiovascular: Normal rate, regular rhythm. No murmur Respiratory: Normal respiratory effort without tachypnea nor retractions. Breath sounds are clear Gastrointestinal: Soft and nontender. No distention.   Musculoskeletal:  Nontender with normal range of motion in all extremities.  Neurologic:  Normal speech and language. No gross focal neurologic deficits  Skin:  Skin is warm, dry and intact.  Psychiatric: Mood and affect are normal.   ____________________________________________    RADIOLOGY  CT scan most consistent with left-sided pyelonephritis and cystitis  ____________________________________________   INITIAL IMPRESSION / ASSESSMENT AND PLAN / ED COURSE  Pertinent labs & imaging results that were available during my care of the patient were reviewed by me and considered in my medical decision making (see chart for details).  Patient presents to the emergency department for left-sided abdominal pain and abnormal lab work.  On examination patient has moderate left-sided abdominal tenderness.  Differential would include pyelonephritis, ureterolithiasis, urinary obstruction/urinary retention, colitis or diverticulitis.  Patient's labs have resulted showing a significant urinary tract infection, creatinine of 5.3, I reviewed the patient's lab work the last labs we have are from 4 years ago at that time her creatinine was normal.  Patient's white blood cell count is 28,000.  We will start on IV antibiotics for urinary tract infection we will send a urine culture will obtain CT renal scan to rule out ureterolithiasis or obstructive uropathy.  Patient agreeable to plan of care.  We will treat pain and nausea medication, IV hydrate.  Patient will require admission to the hospital once her ER work-up has been completed.  CT most consistent with cystitis left-sided pyelonephritis.  We will admit for continued IV antibiotics continued IV hydration given acute renal failure and pyelonephritis.  Patient agreeable to plan of care.  Discussed disposition with interpreter present.  Patient has no further questions.  ____________________________________________   FINAL CLINICAL IMPRESSION(S) / ED  DIAGNOSES  Pyelonephritis Cystitis Acute renal failure    Harvest Dark, MD 02/14/18 2042

## 2018-02-14 NOTE — ED Notes (Signed)
Patient transported to CT 

## 2018-02-15 ENCOUNTER — Inpatient Hospital Stay: Payer: 59

## 2018-02-15 ENCOUNTER — Inpatient Hospital Stay: Payer: Self-pay

## 2018-02-15 DIAGNOSIS — I509 Heart failure, unspecified: Secondary | ICD-10-CM

## 2018-02-15 DIAGNOSIS — J189 Pneumonia, unspecified organism: Secondary | ICD-10-CM

## 2018-02-15 DIAGNOSIS — R945 Abnormal results of liver function studies: Secondary | ICD-10-CM

## 2018-02-15 DIAGNOSIS — N19 Unspecified kidney failure: Secondary | ICD-10-CM

## 2018-02-15 DIAGNOSIS — J939 Pneumothorax, unspecified: Secondary | ICD-10-CM

## 2018-02-15 DIAGNOSIS — N12 Tubulo-interstitial nephritis, not specified as acute or chronic: Secondary | ICD-10-CM

## 2018-02-15 DIAGNOSIS — R140 Abdominal distension (gaseous): Secondary | ICD-10-CM

## 2018-02-15 DIAGNOSIS — A419 Sepsis, unspecified organism: Secondary | ICD-10-CM | POA: Diagnosis present

## 2018-02-15 LAB — CBC WITH DIFFERENTIAL/PLATELET
ABS IMMATURE GRANULOCYTES: 0 10*3/uL (ref 0.00–0.07)
Band Neutrophils: 3 %
Basophils Absolute: 0.2 10*3/uL — ABNORMAL HIGH (ref 0.0–0.1)
Basophils Relative: 1 %
Eosinophils Absolute: 0 10*3/uL (ref 0.0–0.5)
Eosinophils Relative: 0 %
HCT: 27.5 % — ABNORMAL LOW (ref 36.0–46.0)
Hemoglobin: 8.2 g/dL — ABNORMAL LOW (ref 12.0–15.0)
LYMPHS ABS: 0.3 10*3/uL — AB (ref 0.7–4.0)
LYMPHS PCT: 2 %
MCH: 29.1 pg (ref 26.0–34.0)
MCHC: 29.8 g/dL — ABNORMAL LOW (ref 30.0–36.0)
MCV: 97.5 fL (ref 80.0–100.0)
Monocytes Absolute: 0.6 10*3/uL (ref 0.1–1.0)
Monocytes Relative: 4 %
NEUTROS ABS: 14.8 10*3/uL — AB (ref 1.7–7.7)
Neutrophils Relative %: 90 %
PLATELETS: 258 10*3/uL (ref 150–400)
RBC: 2.82 MIL/uL — ABNORMAL LOW (ref 3.87–5.11)
RDW: 14.9 % (ref 11.5–15.5)
SMEAR REVIEW: NORMAL
WBC: 15.9 10*3/uL — ABNORMAL HIGH (ref 4.0–10.5)
nRBC: 0.3 % — ABNORMAL HIGH (ref 0.0–0.2)

## 2018-02-15 LAB — BASIC METABOLIC PANEL
ANION GAP: 18 — AB (ref 5–15)
ANION GAP: 23 — AB (ref 5–15)
Anion gap: 14 (ref 5–15)
Anion gap: 25 — ABNORMAL HIGH (ref 5–15)
BUN: 79 mg/dL — ABNORMAL HIGH (ref 8–23)
BUN: 81 mg/dL — ABNORMAL HIGH (ref 8–23)
BUN: 86 mg/dL — ABNORMAL HIGH (ref 8–23)
BUN: 75 mg/dL — AB (ref 8–23)
CALCIUM: 5.9 mg/dL — AB (ref 8.9–10.3)
CHLORIDE: 101 mmol/L (ref 98–111)
CHLORIDE: 103 mmol/L (ref 98–111)
CHLORIDE: 104 mmol/L (ref 98–111)
CO2: 14 mmol/L — ABNORMAL LOW (ref 22–32)
CO2: 9 mmol/L — ABNORMAL LOW (ref 22–32)
CO2: 8 mmol/L — ABNORMAL LOW (ref 22–32)
CO2: 12 mmol/L — AB (ref 22–32)
CREATININE: 5.08 mg/dL — AB (ref 0.44–1.00)
CREATININE: 4.96 mg/dL — AB (ref 0.44–1.00)
Calcium: 6.9 mg/dL — ABNORMAL LOW (ref 8.9–10.3)
Calcium: 6.9 mg/dL — ABNORMAL LOW (ref 8.9–10.3)
Calcium: 7.1 mg/dL — ABNORMAL LOW (ref 8.9–10.3)
Chloride: 102 mmol/L (ref 98–111)
Creatinine, Ser: 5.25 mg/dL — ABNORMAL HIGH (ref 0.44–1.00)
Creatinine, Ser: 5.29 mg/dL — ABNORMAL HIGH (ref 0.44–1.00)
GFR calc Af Amer: 10 mL/min — ABNORMAL LOW (ref >60)
GFR calc Af Amer: 9 mL/min — ABNORMAL LOW (ref >60)
GFR calc non Af Amer: 9 mL/min — ABNORMAL LOW (ref >60)
GFR calc non Af Amer: 8 mL/min — ABNORMAL LOW (ref >60)
GFR calc non Af Amer: 9 mL/min — ABNORMAL LOW (ref >60)
GFR, EST AFRICAN AMERICAN: 9 mL/min — AB (ref >60)
GFR, EST AFRICAN AMERICAN: 10 mL/min — AB (ref >60)
GFR, EST NON AFRICAN AMERICAN: 8 mL/min — AB (ref >60)
Glucose, Bld: 99 mg/dL (ref 70–99)
Glucose, Bld: 131 mg/dL — ABNORMAL HIGH (ref 70–99)
Glucose, Bld: 102 mg/dL — ABNORMAL HIGH (ref 70–99)
Glucose, Bld: 155 mg/dL — ABNORMAL HIGH (ref 70–99)
POTASSIUM: 5.8 mmol/L — AB (ref 3.5–5.1)
POTASSIUM: 4.5 mmol/L (ref 3.5–5.1)
Potassium: 6.1 mmol/L — ABNORMAL HIGH (ref 3.5–5.1)
Potassium: 3.6 mmol/L (ref 3.5–5.1)
SODIUM: 129 mmol/L — AB (ref 135–145)
Sodium: 130 mmol/L — ABNORMAL LOW (ref 135–145)
Sodium: 135 mmol/L (ref 135–145)
Sodium: 138 mmol/L (ref 135–145)

## 2018-02-15 LAB — HEPATIC FUNCTION PANEL
ALBUMIN: 1.6 g/dL — AB (ref 3.5–5.0)
ALK PHOS: 318 U/L — AB (ref 38–126)
ALT: 23 U/L (ref 0–44)
AST: 54 U/L — ABNORMAL HIGH (ref 15–41)
BILIRUBIN TOTAL: 0.4 mg/dL (ref 0.3–1.2)
Bilirubin, Direct: <0.1 mg/dL (ref 0.0–0.2)
Total Protein: 4.3 g/dL — ABNORMAL LOW (ref 6.5–8.1)

## 2018-02-15 LAB — LACTIC ACID, PLASMA
LACTIC ACID, VENOUS: 2.1 mmol/L — AB (ref 0.5–1.9)
LACTIC ACID, VENOUS: 5.5 mmol/L — AB (ref 0.5–1.9)
LACTIC ACID, VENOUS: 8.8 mmol/L — AB (ref 0.5–1.9)
LACTIC ACID, VENOUS: 9.6 mmol/L — AB (ref 0.5–1.9)
Lactic Acid, Venous: 8.9 mmol/L (ref 0.5–1.9)

## 2018-02-15 LAB — URINALYSIS, COMPLETE (UACMP) WITH MICROSCOPIC
Bilirubin Urine: NEGATIVE
Glucose, UA: NEGATIVE mg/dL
Ketones, ur: NEGATIVE mg/dL
Nitrite: NEGATIVE
Protein, ur: 100 mg/dL — AB
RBC / HPF: >50 RBC/hpf — ABNORMAL HIGH (ref 0–5)
SPECIFIC GRAVITY, URINE: 1.011 (ref 1.005–1.030)
pH: 5 (ref 5.0–8.0)

## 2018-02-15 LAB — CBC
HCT: 22.9 % — ABNORMAL LOW (ref 36.0–46.0)
HEMOGLOBIN: 7.1 g/dL — AB (ref 12.0–15.0)
MCH: 28.9 pg (ref 26.0–34.0)
MCHC: 31 g/dL (ref 30.0–36.0)
MCV: 93.1 fL (ref 80.0–100.0)
PLATELETS: 313 10*3/uL (ref 150–400)
RBC: 2.46 MIL/uL — ABNORMAL LOW (ref 3.87–5.11)
RDW: 14.7 % (ref 11.5–15.5)
WBC: 24.5 10*3/uL — ABNORMAL HIGH (ref 4.0–10.5)
nRBC: 0 % (ref 0.0–0.2)

## 2018-02-15 LAB — APTT: aPTT: 41 seconds — ABNORMAL HIGH (ref 24–36)

## 2018-02-15 LAB — BLOOD GAS, ARTERIAL
ACID-BASE DEFICIT: 13.5 mmol/L — AB (ref 0.0–2.0)
ACID-BASE DEFICIT: 17.7 mmol/L — AB (ref 0.0–2.0)
BICARBONATE: 10 mmol/L — AB (ref 20.0–28.0)
BICARBONATE: 6.7 mmol/L — AB (ref 20.0–28.0)
FIO2: 0.21
FIO2: 28
O2 SAT: 90.4 %
O2 Saturation: 88.5 %
PATIENT TEMPERATURE: 37
Patient temperature: 37
pCO2 arterial: 19 mmHg — CL (ref 32.0–48.0)
pH, Arterial: 7.33 — ABNORMAL LOW (ref 7.350–7.450)
pH, Arterial: 7.26 — ABNORMAL LOW (ref 7.350–7.450)
pO2, Arterial: 60 mmHg — ABNORMAL LOW (ref 83.0–108.0)
pO2, Arterial: 69 mmHg — ABNORMAL LOW (ref 83.0–108.0)

## 2018-02-15 LAB — GLUCOSE, CAPILLARY
Glucose-Capillary: 111 mg/dL — ABNORMAL HIGH (ref 70–99)
Glucose-Capillary: 193 mg/dL — ABNORMAL HIGH (ref 70–99)
Glucose-Capillary: 184 mg/dL — ABNORMAL HIGH (ref 70–99)
Glucose-Capillary: 101 mg/dL — ABNORMAL HIGH (ref 70–99)
Glucose-Capillary: 103 mg/dL — ABNORMAL HIGH (ref 70–99)
Glucose-Capillary: 61 mg/dL — ABNORMAL LOW (ref 70–99)

## 2018-02-15 LAB — PHOSPHORUS: Phosphorus: 5.3 mg/dL — ABNORMAL HIGH (ref 2.5–4.6)

## 2018-02-15 LAB — PROCALCITONIN: Procalcitonin: >150 ng/mL

## 2018-02-15 LAB — POTASSIUM: Potassium: 4.4 mmol/L (ref 3.5–5.1)

## 2018-02-15 LAB — PROTIME-INR
INR: 1.36
Prothrombin Time: 16.6 seconds — ABNORMAL HIGH (ref 11.4–15.2)

## 2018-02-15 LAB — MAGNESIUM: MAGNESIUM: 1.1 mg/dL — AB (ref 1.7–2.4)

## 2018-02-15 LAB — MRSA PCR SCREENING: MRSA BY PCR: NEGATIVE

## 2018-02-15 MED ORDER — SODIUM BICARBONATE 8.4 % IV SOLN
50.0000 meq | Freq: Once | INTRAVENOUS | Status: AC
  Administered 2018-02-15: 50 meq via INTRAVENOUS
  Filled 2018-02-15: qty 50

## 2018-02-15 MED ORDER — SODIUM CHLORIDE 0.9 % IV BOLUS
1000.0000 mL | Freq: Once | INTRAVENOUS | Status: AC
  Administered 2018-02-15: 1000 mL via INTRAVENOUS

## 2018-02-15 MED ORDER — OXYCODONE HCL 5 MG PO TABS
5.0000 mg | ORAL_TABLET | ORAL | Status: DC | PRN
  Administered 2018-02-15: 5 mg via ORAL
  Filled 2018-02-15: qty 1

## 2018-02-15 MED ORDER — NEPRO/CARBSTEADY PO LIQD
237.0000 mL | ORAL | Status: DC
  Administered 2018-02-15 – 2018-02-21 (×6): 237 mL via ORAL

## 2018-02-15 MED ORDER — ALBUMIN HUMAN 25 % IV SOLN
50.0000 g | Freq: Once | INTRAVENOUS | Status: AC
  Administered 2018-02-16: 50 g via INTRAVENOUS
  Filled 2018-02-15: qty 200

## 2018-02-15 MED ORDER — CALCIUM GLUCONATE-NACL 1-0.675 GM/50ML-% IV SOLN
1.0000 g | Freq: Once | INTRAVENOUS | Status: AC
  Administered 2018-02-15: 1000 mg via INTRAVENOUS
  Filled 2018-02-15: qty 50

## 2018-02-15 MED ORDER — STERILE WATER FOR INJECTION IV SOLN
INTRAVENOUS | Status: DC
  Administered 2018-02-15 – 2018-02-17 (×5): via INTRAVENOUS
  Filled 2018-02-15 (×9): qty 850

## 2018-02-15 MED ORDER — SODIUM CHLORIDE 0.9 % IV SOLN
500.0000 mg | INTRAVENOUS | Status: DC
  Administered 2018-02-16: 500 mg via INTRAVENOUS
  Filled 2018-02-15: qty 500
  Filled 2018-02-15: qty 0.5

## 2018-02-15 MED ORDER — NOREPINEPHRINE 16 MG/250ML-% IV SOLN
0.0000 ug/min | INTRAVENOUS | Status: DC
  Administered 2018-02-15: 2 ug/min via INTRAVENOUS
  Filled 2018-02-15: qty 250

## 2018-02-15 MED ORDER — SODIUM CHLORIDE 0.9 % IV BOLUS: 1000.0000 mL | Freq: Once | INTRAVENOUS | Status: AC

## 2018-02-15 MED ORDER — SODIUM POLYSTYRENE SULFONATE 15 GM/60ML PO SUSP
30.0000 g | Freq: Once | ORAL | Status: AC
  Administered 2018-02-15: 30 g via ORAL
  Filled 2018-02-15 (×2): qty 120

## 2018-02-15 MED ORDER — BOOST / RESOURCE BREEZE PO LIQD CUSTOM
1.0000 | Freq: Two times a day (BID) | ORAL | Status: DC
  Administered 2018-02-15 – 2018-02-19 (×7): 1 via ORAL

## 2018-02-15 MED ORDER — SODIUM CHLORIDE 0.9 % IV SOLN
500.0000 mg | INTRAVENOUS | Status: DC
  Administered 2018-02-15: 500 mg via INTRAVENOUS
  Filled 2018-02-15: qty 0.5
  Filled 2018-02-15: qty 500

## 2018-02-15 MED ORDER — OXYCODONE HCL 5 MG PO TABS
5.0000 mg | ORAL_TABLET | ORAL | Status: DC | PRN
  Administered 2018-02-19: 5 mg via ORAL
  Filled 2018-02-15: qty 1

## 2018-02-15 MED ORDER — IPRATROPIUM-ALBUTEROL 0.5-2.5 (3) MG/3ML IN SOLN
3.0000 mL | RESPIRATORY_TRACT | Status: DC | PRN
  Administered 2018-02-16: 3 mL via RESPIRATORY_TRACT
  Filled 2018-02-15: qty 3

## 2018-02-15 MED ORDER — DEXTROSE 50 % IV SOLN
25.0000 mL | Freq: Once | INTRAVENOUS | Status: AC
  Administered 2018-02-15: 25 mL via INTRAVENOUS
  Filled 2018-02-15: qty 50

## 2018-02-15 NOTE — Progress Notes (Signed)
Inpatient Diabetes Program Recommendations  AACE/ADA: New Consensus Statement on Inpatient Glycemic Control (2019)  Target Ranges:  Prepandial:   less than 140 mg/dL      Peak postprandial:   less than 180 mg/dL (1-2 hours)      Critically ill patients:  140 - 180 mg/dL   Results for Cathy Cline, Cathy Cline (MRN [id number]) as of 02/15/2018 13:13  Ref. Range 02/14/2018 22:07 02/15/2018 07:39 02/15/2018 11:47  Glucose-Capillary Latest Ref Range: 70 - 99 mg/dL 90 111 (H) 193 (H)  Results for Cline, Cathy (MRN [id number]) as of 02/15/2018 13:13  Ref. Range 02/14/2018 18:44  Hemoglobin A1C Latest Ref Range: 4.8 - 5.6 % 9.4 (H)   Review of Glycemic Control  Diabetes history: DM2 Outpatient Diabetes medications: Levemir 25 units QAM, Levemir 20 units QPM, Metformin XR 1000 mg BID Current orders for Inpatient glycemic control: Novolog 0-9 units TID with meals, Lantus 25 units QHS  Inpatient Diabetes Program Recommendations:  HgbA1C: A1C 9.4% on 02/14/18 indicating an average glucose of 223 mg/dl over the past 2-3 months.  NOTE: Spoke with patient (using Stratus device; Webb Silversmith [phone]) about diabetes and home regimen for diabetes control. Patient reports that she takes Levemir 25 units QAM, Levemir 20 units QHS (if needed), and Metformin XR 1000 mg BID as an outpatient for diabetes control.  Patient states that her doctor has advised her to slightly increase or decrease Levemir dose depending on glucose if needed (therefore, sometimes she takes 20 units of Levemir in the morning and 15 units of Levemir at night when she feels she needs to take less insulin). Patient states that she consistently takes at least 20 units of Levemir QAM and she takes evening dose of Levemir if needed based on glucose. Patient reports that she last took Levemir 20 units yesterday morning (on 02/14/18). Patient states that she checks her glucose 2 times per day and that it ranges from mid 100's - mostly 200's mg/dl.    Inquired about prior A1C and patient reports that she knows her doctor has mentioned an A1C but she does not really understand what it is. Patient had paperwork with her in the room from last office visit with PCP and per paperwork, A1C was 7.8% in June 2019. Explained what an A1C is and discussed A1C results (9.4% on 02/14/18) and explained that her current A1C indicates an average glucose of 223 mg/dl over the past 2-3 months. Discussed glucose and A1C goals. Discussed importance of checking CBGs and maintaining good CBG control to prevent long-term and short-term complications.  Encouraged patient to check her glucose 2-3 times per day and to keep a log book of glucose readings and insulin taken which she will need to take to doctor appointments. Explained how the doctor she follows up with can use the log book to continue to make insulin adjustments if needed. Explained to patient that she may need to be taking the Levemir twice a day as prescribed but encouraged her to keep a written log of exactly how much insulin she takes each time so the doctor can use the information to make adjustments if needed.  Patient verbalized understanding of information discussed and she states that she has no further questions at this time related to diabetes.  Thanks, Barnie Alderman, RN, MSN, CDE Diabetes Coordinator Inpatient Diabetes Program [phone] (Team Pager)

## 2018-02-15 NOTE — Progress Notes (Signed)
PT Cancellation Note  Patient Details Name: Cathy Cline MRN: [id number] DOB: September 05, 1955   Cancelled Treatment:    Reason Eval/Treat Not Completed: Medical issues which prohibited therapy.  Potassium elevated at 6.1 today.  Recent drop in Hgb and Calcium as well.  Per protocol, will hold PT until pt more medically appropriate.   Collie Siad PT, DPT 02/15/2018, 1:19 PM

## 2018-02-15 NOTE — Consult Note (Signed)
Urology Consult  I have been asked to see the patient by Dr. Estanislado Pandy, for evaluation and management of left hydronephrosis.  Chief Complaint: N/A  History of Present Illness: Cathy Cline is a 62 y.o. year old who presented to the [facility] ED on 02/14/2018 with a 4-day history of left flank pain associated with nausea and vomiting.  The pain was described as severe and radiated to her left abdomen.  She denied lower urinary tract symptoms.  Urinalysis in the ED showed large WBC/RBC.  Admission creatinine was 5.30.  Urine culture is pending.  A repeat urinalysis today shows greater than sign 50 RBC and WBC. She was started on antibiotic therapy.  Her flank pain has resolved.  A noncontrast CT and pelvis was performed which showed an enlarged, edematous kidney with perinephric stranding and mild left hydronephrosis.  No ureteral calculus was noted.  She has been oliguric and produce 200 cc of urine that is recorded in the chart.  She apparently voided today which was not recorded.  A renal ultrasound was performed today which again showed mild left hydronephrosis.  Past medical history remarkable for diabetes.  CT showed no air or abscess.  Past Medical History:  Diagnosis Date  . Carotid stenosis   . Diabetes mellitus without complication ([place])   . Hypercholesterolemia   . Hypertension   . Myocardial infarction ([place])   . Tachycardia     Past Surgical History:  Procedure Laterality Date  . CARDIAC CATHETERIZATION    . COLONOSCOPY WITH PROPOFOL N/A 10/02/2015   Procedure: COLONOSCOPY WITH PROPOFOL;  Surgeon: Manya Silvas, MD;  Location: [facility] ENDOSCOPY;  Service: Endoscopy;  Laterality: N/A;  . KNEE SURGERY    . TUBAL LIGATION      Home Medications:  Current Meds  Medication Sig  . amLODipine (NORVASC) 2.5 MG tablet Take 2.5 mg by mouth daily.  Marland Kitchen aspirin 81 MG tablet Take 81 mg by mouth daily.  Marland Kitchen atorvastatin (LIPITOR) 40 MG tablet Take 40 mg by mouth at bedtime.   .  cetirizine (ZYRTEC) 10 MG tablet Take 10 mg by mouth at bedtime.  . insulin detemir (LEVEMIR) 100 UNIT/ML injection Inject 20-25 Units into the skin See admin instructions. Inject 25u under the skin every morning and inject 20u under the skin ever evening  . losartan-hydrochlorothiazide (HYZAAR) 100-25 MG tablet Take 1 tablet by mouth daily.  . metFORMIN (GLUCOPHAGE-XR) 500 MG 24 hr tablet Take 1,000 mg by mouth 2 (two) times daily.  . metoprolol tartrate (LOPRESSOR) 25 MG tablet Take 25 mg by mouth 2 (two) times daily.    Allergies: No Known Allergies  No family history on file.  Social History:  reports that she has never smoked. She has never used smokeless tobacco. She reports that she does not drink alcohol or use drugs.  ROS: A complete review of systems was performed.  All systems are negative except for pertinent findings as noted.  Physical Exam:  Vital signs in last 24 hours: Temp:  [97.8 F (36.6 C)-100.7 F (38.2 C)] 100.7 F (38.2 C) (11/27 1710) Pulse Rate:  [81-119] 114 (11/27 1510) Resp:  [16-33] 33 (11/27 1710) BP: (103-130)/(39-102) 123/99 (11/27 1710) SpO2:  [92 %-99 %] 92 % (11/27 1710) Weight:  [49.1 kg-54.8 kg] 54.8 kg (11/27 1710) Constitutional:  Alert, No acute distress HEENT: [place] AT, moist mucus membranes.  Trachea midline, no masses Respiratory: Normal respiratory effort GI: Abdomen is soft, nontender, nondistended, no abdominal masses GU: No  CVA tenderness Skin: No rashes, bruises or suspicious lesions Neurologic: Grossly intact, no focal deficits, moving all 4 extremities  Laboratory Data:  Recent Labs    02/14/18 1844 02/15/18 0419 02/15/18 1720  WBC 28.7* 24.5* 15.9*  HGB 8.9* 7.1* 8.2*  HCT 27.1* 22.9* 27.5*   Recent Labs    02/15/18 0419 02/15/18 1451 02/15/18 1720 02/15/18 1735  NA 129* 130*  --  135  K 6.1* 5.8* 4.4 4.5  CL 101 103  --  104  CO2 14* 9*  --  8*  GLUCOSE 99 131*  --  102*  BUN 79* 81*  --  86*  CREATININE 5.08*  5.25*  --  5.29*  CALCIUM 6.9* 6.9*  --  7.1*   No results for input(s): LABPT, INR in the last 72 hours. No results for input(s): LABURIN in the last 72 hours. Results for orders placed or performed during the hospital encounter of 02/14/18  MRSA PCR Screening     Status: None   Collection Time: 02/15/18  5:13 PM  Result Value Ref Range Status   MRSA by PCR NEGATIVE NEGATIVE Final    Comment:        The GeneXpert MRSA Assay (FDA approved for NASAL specimens only), is one component of a comprehensive MRSA colonization surveillance program. It is not intended to diagnose MRSA infection nor to guide or monitor treatment for MRSA infections. Performed at [facility], [address], [place], [place] [address]      Radiologic Imaging: US Renal  Result Date: 02/15/2018 CLINICAL DATA:  Renal failure. EXAM: RENAL / URINARY TRACT ULTRASOUND COMPLETE COMPARISON:  CT abdomen and pelvis 02/14/2018 FINDINGS: Right Kidney: Renal measurements: 8.7 x 4.3 x 5.0 cm = volume: 98 mL . Echogenicity within normal limits. No mass or hydronephrosis visualized. Left Kidney: Renal measurements: 13.2 x 6.6 x 6.0 cm = volume: 272 mL. Mild hydronephrosis without mass or perinephric fluid collection identified. Bladder: Collapsed bladder, poorly evaluated. IMPRESSION: Persistent asymmetric enlargement of the left kidney with mild left hydronephrosis, similar to yesterday's CT. Electronically Signed   By: Logan Bores M.D.   On: 02/15/2018 14:32   Dg Chest Port 1 View  Result Date: 02/15/2018 CLINICAL DATA:  Acute respiratory failure EXAM: PORTABLE CHEST 1 VIEW COMPARISON:  10/01/2013, CT chest 09/22/2015 FINDINGS: Cardiomegaly. Bibasilar atelectasis. No pleural effusion or pneumothorax. IMPRESSION: Cardiomegaly with bibasilar atelectasis Electronically Signed   By: Donavan Foil M.D.   On: 02/15/2018 17:46   Ct Renal Stone Study  Result Date: 02/14/2018 CLINICAL DATA:  62 y/o F; leukocytosis and  elevated creatinine. Left flank pain. Nausea and vomiting. EXAM: CT ABDOMEN AND PELVIS WITHOUT CONTRAST TECHNIQUE: Multidetector CT imaging of the abdomen and pelvis was performed following the standard protocol without IV contrast. COMPARISON:  10/02/2013 abdominal ultrasound. FINDINGS: Lower chest: Right lung base atelectasis. Trace left effusion. Mild coronary artery calcific atherosclerosis. Hepatobiliary: No focal liver abnormality is seen. Cholelithiasis. No gallbladder wall thickening. No biliary ductal dilatation. Pancreas: Unremarkable. No pancreatic ductal dilatation or surrounding inflammatory changes. Spleen: Normal in size without focal abnormality. Adrenals/Urinary Tract: Normal adrenal glands. Normal right kidney. No urinary stone disease identified. The left kidney is enlarged, there is extensive left-sided perinephric stranding, and there is mild left hydronephrosis. No obstructing stone or mass identified. Bladder wall thickening. Stomach/Bowel: Stomach is within normal limits. Appendix appears normal. No evidence of bowel wall thickening, distention, or inflammatory changes. Vascular/Lymphatic: Aortic atherosclerosis. No enlarged abdominal or pelvic lymph nodes. Reproductive: Uterus  and bilateral adnexa are unremarkable. Other: No abdominal wall hernia or abnormality. No abdominopelvic ascites. Musculoskeletal: L5-S1 grade 1 anterolisthesis and chronic L5 pars defects. No acute fracture identified. IMPRESSION: 1. Enlarged left kidney, left perinephric stranding, and mild left hydronephrosis. Bladder wall thickening. No obstructing stone or mass identified. Findings probably represent infection of the renal collecting system, less likely recently passed stone. 2. Cholelithiasis. 3. Mild coronary artery and aortic calcific atherosclerosis. Electronically Signed   By: Kristine Garbe M.D.   On: 02/14/2018 20:11   [place] Ekg Site Rite  Result Date: 02/15/2018 If Site Rite image not attached,  placement could not be confirmed due to current cardiac rhythm.   Impression:  62 year old female with severe left pyelonephritis and acute renal failure.  She has mild left hydronephrosis which is most likely endotoxin mediated dilation of the collecting system and not obstruction.  Would not recommend stent placement at this time.  Her creatinine morning had decreased to 5.08 however at 1730 was 5.29.  She has been evaluated by nephrology with plans for possible dialysis.  Recommendation:  -Continue IV antibiotic and supportive care -Reimage for any clinical deterioration   02/15/2018, 8:39 PM  John Giovanni,  MD

## 2018-02-15 NOTE — Consult Note (Signed)
Name: Cathy Cline MRN: [id number] DOB: [birth year]    ADMISSION DATE:  02/14/2018 CONSULTATION DATE: 02/15/2018  REFERRING MD : Dr. Jerelyn Charles   CHIEF COMPLAINT: Abnormal Labs   BRIEF PATIENT DESCRIPTION:  62 yo female admitted with sepsis and acute renal failure with hyperkalemia secondary to acute pyelonephritis and mild left hydronephrosis   SIGNIFICANT EVENTS  11/26-Pt admitted to [facility] unit  11/27-Pt transferred to the stepdown unit with worsening acute renal failure and hyperkalemia due to urosepsis   STUDIES:  CT Renal Stone Study 11/26>>Enlarged left kidney, left perinephric stranding, and mild left hydronephrosis. Bladder wall thickening. No obstructing stone or mass identified. Findings probably represent infection of the renal collecting system, less likely recently passed stone. Cholelithiasis. Mild coronary artery and aortic calcific atherosclerosis US Renal 11/27>>Persistent asymmetric enlargement of the left kidney with mild left hydronephrosis, similar to yesterday's CT.  HISTORY OF PRESENT ILLNESS:   This is a 62 yo female with a PMH of Tachycardia, Myocardial Infarction, HTN, Hypercholesterolemia, Type II Diabetes Mellitus, and Carotid Stenosis.  She presented to [facility] ER on 11/26 per PCP recommendations with elevated creatinine and leukocytosis. Per ER notes the pt reported 8/10 left flank pain with radiation to her left mid back with nausea/vomiting onset of symptoms 11/23. She saw her PCP on 11/26 due to symptoms and blood work obtained pt contact by PCP today regarding abnormal results.  In the ER CT Renal Stone Study showed enlarged left kidney, left perinephric stranding, and mild left hydronephrosis, no obstructing stone or mass identified.  Lab results revealed Na+ 130, chloride 95, CO2 19, glucose 148, BUN 78, creatinine 5.30, calcium 8.1, anion gap 16, lactic acid 3.7, wbc 28.7, hgb 8.9, and UA positive for UTI.  She does take 1000 mg of metformin bid at  home. She received 2L NS bolus and iv ceftriaxone.  She was subsequently admitted to the [facility] unit for additional workup and treatment.  On 11/27 pt developed worsening acute renal failure with hyperkalemia nephrology and urology consulted pt transferred to the stepdown unit for closer monitoring.    PAST MEDICAL HISTORY :   has a past medical history of Carotid stenosis, Diabetes mellitus without complication ([place]), Hypercholesterolemia, Hypertension, Myocardial infarction ([place]), and Tachycardia.  has a past surgical history that includes Cardiac catheterization; Tubal ligation; Knee surgery; and Colonoscopy with propofol (N/A, 10/02/2015). Prior to Admission medications   Medication Sig Start Date End Date Taking? Authorizing Provider  amLODipine (NORVASC) 2.5 MG tablet Take 2.5 mg by mouth daily.   Yes [provider]  aspirin 81 MG tablet Take 81 mg by mouth daily.   Yes [provider]  atorvastatin (LIPITOR) 40 MG tablet Take 40 mg by mouth at bedtime.    Yes [provider]  cetirizine (ZYRTEC) 10 MG tablet Take 10 mg by mouth at bedtime.   Yes [provider]  insulin detemir (LEVEMIR) 100 UNIT/ML injection Inject 20-25 Units into the skin See admin instructions. Inject 25u under the skin every morning and inject 20u under the skin ever evening   Yes [provider]  losartan-hydrochlorothiazide (HYZAAR) 100-25 MG tablet Take 1 tablet by mouth daily.   Yes [provider]  metFORMIN (GLUCOPHAGE-XR) 500 MG 24 hr tablet Take 1,000 mg by mouth 2 (two) times daily.   Yes [provider]  metoprolol tartrate (LOPRESSOR) 25 MG tablet Take 25 mg by mouth 2 (two) times daily.   Yes [provider]   No Known Allergies  FAMILY  HISTORY:  family history is not on file. SOCIAL HISTORY:  reports that she has never smoked. She has never used smokeless tobacco. She reports that she does not drink alcohol or use drugs.  REVIEW  OF SYSTEMS: Positives in BOLD  Constitutional: Negative for fever, chills, weight loss, malaise/fatigue and diaphoresis.  HENT: Negative for hearing loss, ear pain, nosebleeds, congestion, sore throat, neck pain, tinnitus and ear discharge.   Eyes: Negative for blurred vision, double vision, photophobia, pain, discharge and redness.  Respiratory: Negative for cough, hemoptysis, sputum production, shortness of breath, wheezing and stridor.   Cardiovascular: Negative for chest pain, palpitations, orthopnea, claudication, leg swelling and PND.  Gastrointestinal: Negative for heartburn, nausea, vomiting, abdominal pain, diarrhea, constipation, blood in stool and melena.  Genitourinary: Negative for dysuria, urgency, frequency, hematuria and flank pain.  Musculoskeletal: Negative for myalgias, back pain, joint pain and falls.  Skin: Negative for itching and rash.  Neurological: Negative for dizziness, tingling, tremors, sensory change, speech change, focal weakness, seizures, loss of consciousness, weakness and headaches.  Endo/Heme/Allergies: Negative for environmental allergies and polydipsia. Does not bruise/bleed easily.  SUBJECTIVE:  No complaints at this time   VITAL SIGNS: Temp:  [97.8 F (36.6 C)-99.9 F (37.7 C)] 99.9 F (37.7 C) (11/27 1510) Pulse Rate:  [81-119] 114 (11/27 1510) Resp:  [16-24] 16 (11/27 1510) BP: (103-132)/(39-102) 130/39 (11/27 1510) SpO2:  [94 %-100 %] 97 % (11/27 1510) Weight:  [47.6 kg-49.1 kg] 49.1 kg (11/27 0500)  PHYSICAL EXAMINATION: General: acutely ill appearing female resting in bed  Neuro: alert and oriented, follows commands  HEENT: supple, no JVD  Cardiovascular: nsr, no R/G Lungs: clear throughout, slightly tachypneic  Abdomen: +BS x4, obese, soft, non tender, non distended  Musculoskeletal: normal bulk and tone, no edema  Skin: intact no rashes or lesions   Recent Labs  Lab 02/14/18 1844 02/15/18 0419 02/15/18 1451  NA 130* 129* 130*    K 5.1 6.1* 5.8*  CL 95* 101 103  CO2 19* 14* 9*  BUN 78* 79* 81*  CREATININE 5.30* 5.08* 5.25*  GLUCOSE 148* 99 131*   Recent Labs  Lab 02/14/18 1844 02/15/18 0419  HGB 8.9* 7.1*  HCT 27.1* 22.9*  WBC 28.7* 24.5*  PLT 314 313   US Renal  Result Date: 02/15/2018 CLINICAL DATA:  Renal failure. EXAM: RENAL / URINARY TRACT ULTRASOUND COMPLETE COMPARISON:  CT abdomen and pelvis 02/14/2018 FINDINGS: Right Kidney: Renal measurements: 8.7 x 4.3 x 5.0 cm = volume: 98 mL . Echogenicity within normal limits. No mass or hydronephrosis visualized. Left Kidney: Renal measurements: 13.2 x 6.6 x 6.0 cm = volume: 272 mL. Mild hydronephrosis without mass or perinephric fluid collection identified. Bladder: Collapsed bladder, poorly evaluated. IMPRESSION: Persistent asymmetric enlargement of the left kidney with mild left hydronephrosis, similar to yesterday's CT. Electronically Signed   By: Logan Bores M.D.   On: 02/15/2018 14:32   Ct Renal Stone Study  Result Date: 02/14/2018 CLINICAL DATA:  62 y/o F; leukocytosis and elevated creatinine. Left flank pain. Nausea and vomiting. EXAM: CT ABDOMEN AND PELVIS WITHOUT CONTRAST TECHNIQUE: Multidetector CT imaging of the abdomen and pelvis was performed following the standard protocol without IV contrast. COMPARISON:  10/02/2013 abdominal ultrasound. FINDINGS: Lower chest: Right lung base atelectasis. Trace left effusion. Mild coronary artery calcific atherosclerosis. Hepatobiliary: No focal liver abnormality is seen. Cholelithiasis. No gallbladder wall thickening. No biliary ductal dilatation. Pancreas: Unremarkable. No pancreatic ductal dilatation or surrounding inflammatory changes. Spleen: Normal in size without focal abnormality.  Adrenals/Urinary Tract: Normal adrenal glands. Normal right kidney. No urinary stone disease identified. The left kidney is enlarged, there is extensive left-sided perinephric stranding, and there is mild left hydronephrosis. No  obstructing stone or mass identified. Bladder wall thickening. Stomach/Bowel: Stomach is within normal limits. Appendix appears normal. No evidence of bowel wall thickening, distention, or inflammatory changes. Vascular/Lymphatic: Aortic atherosclerosis. No enlarged abdominal or pelvic lymph nodes. Reproductive: Uterus and bilateral adnexa are unremarkable. Other: No abdominal wall hernia or abnormality. No abdominopelvic ascites. Musculoskeletal: L5-S1 grade 1 anterolisthesis and chronic L5 pars defects. No acute fracture identified. IMPRESSION: 1. Enlarged left kidney, left perinephric stranding, and mild left hydronephrosis. Bladder wall thickening. No obstructing stone or mass identified. Findings probably represent infection of the renal collecting system, less likely recently passed stone. 2. Cholelithiasis. 3. Mild coronary artery and aortic calcific atherosclerosis. Electronically Signed   By: Kristine Garbe M.D.   On: 02/14/2018 20:11    ASSESSMENT / PLAN:  Sepsis secondary to acute pyelonephritis Continuous telemetry monitoring Trend WBC and monitor fever curve  Trend PCT Follow cultures Continue meropenum  Maintain map >65 Urology consulted appreciate input   Acute renal failure with hyperkalemia secondary to sepsis and mild hydronephrosis  Lactic acidosis  Trend BMP and lactic acid  Replace electrolytes as indicated  Hold outpatient metformin and avoid nephrotoxic medications Monitor UOP Bladder scan results pending  Nephrology consulted appreciate input-pt may require dialysis repeat BMP pending @1700  NS @125  ml/hr    Anemia without obvious signs of acute blood loss VTE px: subq heparin for now will repeat CBC if hgb continues to trend down will discontinue Monitor for s/sx of bleeding and transfuse for hgb <7  Type II Diabetes Mellitus  CBG's ac/hs  SSI and lantus   Marda Stalker, [place] Pager [phone] (please enter 7 digits) PCCM  Consult Pager [phone] (please enter 7 digits)

## 2018-02-15 NOTE — Progress Notes (Signed)
IV Nurse received order for PICC placement on patient.  IV Nurse educated NP Dewaine Conger that due to patient bun/cr of 86/5.29 , lactic acid of 8.9, and possible nephrology consult for hemodialysis that PICC placement is contraindicated per hospital policy.  NP Dewaine Conger verbalized understanding and reported he would "place a line" in patient.

## 2018-02-15 NOTE — Progress Notes (Addendum)
Pt complaining of SOB, placed on 2L [place]. Dr.Samaan at bedside. Pt given 1 amp of bicarb and started on bicarb gtt. Dr.Samaan notified of elevated lactic acid @ 8.9, 1L fluid bolus of NS given and started on NS gtt @125 . Pt placed on Bipap and prn breathing treatments ordered. Foley placed per team. Interpretor services used to update patient and family, education given. Central line placed by Darel Hong, NP d/t limited vascular access.Will continue to monitor.

## 2018-02-15 NOTE — Progress Notes (Signed)
Pharmacy Antibiotic Note  Cathy Cline is a 62 y.o. female admitted on 02/14/2018 with intra abdominal.  Pharmacy has been consulted for Zosyn  Dosing. CT of her abdomen showed perinephric stranding consistent with pyelonephritis.   Plan: Zosyn 3.375 gm IV Q12H   Height: 4\' 11"  (149.9 cm) Weight: 108 lb 3.2 oz (49.1 kg) IBW/kg (Calculated) : 43.2  Temp (24hrs), Avg:98 F (36.7 C), Min:97.8 F (36.6 C), Max:98.2 F (36.8 C)  Recent Labs  Lab 02/14/18 1844 02/14/18 2146 02/15/18 0419  WBC 28.7*  --   --   CREATININE 5.30*  --   --   LATICACIDVEN 3.7* 3.9* 2.1*    Estimated Creatinine Clearance: 7.6 mL/min (A) (by C-G formula based on SCr of 5.3 mg/dL (H)).    No Known Allergies  Antimicrobials this admission:  11/27 pip/tazo >>    Dose adjustments this admission: Dose adjusted pip/tazo to q12H.   Microbiology results:  UCx: pending  Thank you for allowing pharmacy to be a part of this patient's care.  Oswald Hillock, PharmD 02/15/2018 10:28 AM

## 2018-02-15 NOTE — Significant Event (Signed)
Hypoglycemic Event  CBG: 61  Treatment: D50 IV 25 mL  Symptoms: Pale  Follow-up CBG: Time: 2327 CBG Result:103  Possible Reasons for Event: Change in activity. Pt has been NPO for dinner d/t increased respiratory support and change in status  Comments/MD notified: Darel Hong, NP notified and ordered D50W    Dena Billet

## 2018-02-15 NOTE — Progress Notes (Signed)
PT Cancellation Note  Patient Details Name: Cathy Cline MRN: [id number] DOB: [birth year]   Cancelled Treatment:    Reason Eval/Treat Not Completed: Medical issues which prohibited therapy(Patient transferred to CCU due to worsening sepsis, hyperkalemia.  Per guidelines, will require new order to resume PT services after transfer to higher level of care.  Will complete initial order; please reconsult as medically appropriate.)  Asaiah Hunnicutt H. Owens Shark, PT, DPT, NCS 02/15/18, 9:45 PM [phone]

## 2018-02-15 NOTE — Procedures (Signed)
Central Venous Catheter Insertion Procedure Note Cathy Cline [id number] 12-10-1955  Procedure: Insertion of Central Venous Catheter Indications: Assessment of intravascular volume, Drug and/or fluid administration and Frequent blood sampling  Procedure Details Consent: Risks of procedure as well as the alternatives and risks of each were explained to the (patient/caregiver).  Consent for procedure obtained. Time Out: Verified patient identification, verified procedure, site/side was marked, verified correct patient position, special equipment/implants available, medications/allergies/relevent history reviewed, required imaging and test results available.  Performed  Maximum sterile technique was used including antiseptics, cap, gloves, gown, hand hygiene, mask and sheet. Skin prep: Chlorhexidine; local anesthetic administered A antimicrobial bonded/coated triple lumen catheter was placed in the left internal jugular vein using the Seldinger technique.  Evaluation Blood flow good Complications: No apparent complications Patient did tolerate procedure well. Chest X-ray ordered to verify placement.  CXR: pending.   Ultrasound was used for direct visualization of vessel cannulization of Left IJ.     Darel Hong, AGACNP-BC [facility] Pager: [phone] Cell: [phone]   Cathy Cline 02/15/2018, 10:22 PM

## 2018-02-15 NOTE — Progress Notes (Signed)
Initial Nutrition Assessment  DOCUMENTATION CODES:   Not applicable  INTERVENTION:   -Order pt strawberry ensure when hyperkalemia has resolved  -Boost Breeze po TID, each supplement provides 250 kcal and 9 grams of protein -Nepro Shake po daily, each supplement provides 425 kcal and 19 grams protein -MVI   NUTRITION DIAGNOSIS:   Inadequate oral intake related to poor appetite, acute illness(acute kidney injury/sepsis) as evidenced by meal completion < 50%, energy intake < 75% for > 7 days, per patient/family report.  GOAL:   Patient will meet greater than or equal to 90% of their needs  MONITOR:   PO intake, Weight trends, Labs, Supplement acceptance  REASON FOR ASSESSMENT:   Consult Assessment of nutrition requirement/status  ASSESSMENT:  62 year old female with known history of HTN, CAD, T2DM, MI, cardiac cath, admitted with severe acute kidney injury and pyelonephritis.    Spanish speaking patient shivering under many blankets at time of visit. RN in room adjusting medications and able to translate. Lunch tray at bedside with ~10% intake (small bite of sandwich, half of corn, bites of broccoli)   Pt reports 2 small meals/day when feeling well and UBW 105lb. Pt c/o rt leg pain when completing NFPE , stating the pain is from shivering so hard.   Pt is open to trying strawberry Ensure and requested it be at room temperature. Due to current potassium level, RD will supplement with Boost Breeze. RD to monitor and provide patient with ensure when appropriate.   Medications: colace, novolog (0-9 untis) with meals, Lantus (28 units) at bedtime, oxycodone  0.9% NaCl @100mL /hr Ca gluconate 1g/91mL NaCl Zosyn  Labs: Na 129 (L) - replacing Potassium 6.1 (H) BUN 79 (H) Cr 5.08 (H)   NUTRITION - FOCUSED PHYSICAL EXAM:    Most Recent Value  Orbital Region  Mild depletion  Upper Arm Region  No depletion  Thoracic and Lumbar Region  Unable to assess  Buccal Region  Mild  depletion  Temple Region  Mild depletion  Clavicle Bone Region  No depletion  Clavicle and Acromion Bone Region  No depletion  Scapular Bone Region  Unable to assess  Dorsal Hand  Mild depletion  Patellar Region  Mild depletion  Anterior Thigh Region  Unable to assess  Posterior Calf Region  Mild depletion  Edema (RD Assessment)  None  Hair  Reviewed  Eyes  Reviewed  Mouth  Reviewed  Skin  Reviewed  Nails  Reviewed       Diet Order:   Diet Order            Diet heart healthy/carb modified Room service appropriate? Yes; Fluid consistency: Thin  Diet effective now              EDUCATION NEEDS:   Not appropriate for education at this time  Skin:  Skin Assessment: Reviewed RN Assessment  Last BM:  02/14/2018  Height:   Ht Readings from Last 1 Encounters:  02/14/18 4\' 11"  (1.499 m)    Weight:   Wt Readings from Last 1 Encounters:  02/15/18 49.1 kg    Ideal Body Weight:  43.6 kg  BMI:  Body mass index is 21.85 kg/m.  Estimated Nutritional Needs:   Kcal:  9357-0177 (MSJ 1.25-1.4)  Protein:  49-59 grams (1-1.2g/kg)  Fluid:  1.2-1.3L    Lajuan Lines, RD, LDN  After Hours/Weekend Pager: [phone]

## 2018-02-15 NOTE — Plan of Care (Signed)
The patient was transfer to stepdown due to lactic acid levels, ABG results. MD has transfer the patient for closer monitoring of the patient. High potassium levels being corrected. IV fluids increased to 122ml and 1L bolus of NS given. The patient is able to ambulate with one person assist. Dietary consult placed and recommended protein drinks.   Problem: Education: Goal: Knowledge of General Education information will improve Description Including pain rating scale, medication(s)/side effects and non-pharmacologic comfort measures Outcome: Not Progressing   Problem: Health Behavior/Discharge Planning: Goal: Ability to manage health-related needs will improve Outcome: Not Progressing   Problem: Clinical Measurements: Goal: Ability to maintain clinical measurements within normal limits will improve Outcome: Not Progressing Goal: Will remain free from infection Outcome: Not Progressing Goal: Diagnostic test results will improve Outcome: Not Progressing Goal: Respiratory complications will improve Outcome: Not Progressing Goal: Cardiovascular complication will be avoided Outcome: Not Progressing   Problem: Activity: Goal: Risk for activity intolerance will decrease Outcome: Not Progressing   Problem: Nutrition: Goal: Adequate nutrition will be maintained Outcome: Not Progressing   Problem: Coping: Goal: Level of anxiety will decrease Outcome: Not Progressing   Problem: Elimination: Goal: Will not experience complications related to bowel motility Outcome: Not Progressing Goal: Will not experience complications related to urinary retention Outcome: Not Progressing   Problem: Pain Managment: Goal: General experience of comfort will improve Outcome: Not Progressing   Problem: Safety: Goal: Ability to remain free from injury will improve Outcome: Not Progressing   Problem: Skin Integrity: Goal: Risk for impaired skin integrity will decrease Outcome: Not Progressing

## 2018-02-15 NOTE — Progress Notes (Signed)
Pharmacy Antibiotic Note  Cathy Cline is a 62 y.o. female admitted on 02/14/2018 with sepsis.  Pharmacy has been consulted for meropenem dosing. CT of her abdomen showed perinephric stranding consistent with pyelonephritis. WBC 28.7>> 24.5, lactic trending down 3.9>>2.1, remains afeb. Pt seems to be worsening per team. Escalating therapy from pip/tazo to meropenem, which will add ESBL coverage. Pt does not seem to be on HD.   Plan: Will start meropenem 500 mg q24H. Renally adjusted.   Height: 4\' 11"  (149.9 cm) Weight: 108 lb 3.2 oz (49.1 kg) IBW/kg (Calculated) : 43.2  Temp (24hrs), Avg:98 F (36.7 C), Min:97.8 F (36.6 C), Max:98.2 F (36.8 C)  Recent Labs  Lab 02/14/18 1844 02/14/18 2146 02/15/18 0419  WBC 28.7*  --  24.5*  CREATININE 5.30*  --  5.08*  LATICACIDVEN 3.7* 3.9* 2.1*    Estimated Creatinine Clearance: 7.9 mL/min (A) (by C-G formula based on SCr of 5.08 mg/dL (H)).    No Known Allergies  Antimicrobials this admission: 11/26 ceftriaxone >> 11/26 11/26 pip/tazo >> 11/27 11/27 meropenem >>   Dose adjustments this admission: AKI: - CrCl < 10 ml/min - adjusted to meropenem 500 mg q24H  Microbiology results: 11/26 UCx: pending    Thank you for allowing pharmacy to be a part of this patient's care.  Oswald Hillock, PharmD Clinical Pharmacist 02/15/2018 1:31 PM

## 2018-02-15 NOTE — Progress Notes (Signed)
Patient has severe sepsis Will transfer to step down unit with close monitoring

## 2018-02-15 NOTE — Progress Notes (Addendum)
[facility] [place] NAME: Cathy Cline    MR#:  [id number]  DATE OF BIRTH:  Dec 06, 1955  SUBJECTIVE:  CHIEF COMPLAINT:   Chief Complaint  Patient presents with  . Abnormal Lab  Patient seen and evaluated today Family is at bedside Decreased abdominal pain No fever No nausea and vomiting  REVIEW OF SYSTEMS:    ROS  CONSTITUTIONAL: No documented fever. No fatigue, weakness. No weight gain, no weight loss.  EYES: No blurry or double vision.  ENT: No tinnitus. No postnasal drip. No redness of the oropharynx.  RESPIRATORY: No cough, no wheeze, no hemoptysis. No dyspnea.  CARDIOVASCULAR: No chest pain. No orthopnea. No palpitations. No syncope.  GASTROINTESTINAL: Decreased nausea, decreased vomiting , no diarrhea. Decreased abdominal pain. No melena or hematochezia.  GENITOURINARY: No dysuria or hematuria.  ENDOCRINE: No polyuria or nocturia. No heat or cold intolerance.  HEMATOLOGY: No anemia. No bruising. No bleeding.  INTEGUMENTARY: No rashes. No lesions.  MUSCULOSKELETAL: No arthritis. No swelling. No gout.  NEUROLOGIC: No numbness, tingling, or ataxia. No seizure-type activity.  PSYCHIATRIC: No anxiety. No insomnia. No ADD.   DRUG ALLERGIES:  No Known Allergies  VITALS:  Blood pressure (!) 111/46, pulse 81, temperature 97.9 F (36.6 C), temperature source Oral, resp. rate 16, height 4\' 11"  (1.499 m), weight 49.1 kg, SpO2 96 %.  PHYSICAL EXAMINATION:   Physical Exam  GENERAL:  62 y.o.-year-old patient lying in the bed with no acute distress.  EYES: Pupils equal, round, reactive to light and accommodation. No scleral icterus. Extraocular muscles intact.  HEENT: Head atraumatic, normocephalic. Oropharynx dry and nasopharynx clear.  NECK:  Supple, no jugular venous distention. No thyroid enlargement, no tenderness.  LUNGS: Normal breath sounds bilaterally, no wheezing, rales, rhonchi. No use of accessory muscles of  respiration.  CARDIOVASCULAR: S1, S2 normal. No murmurs, rubs, or gallops.  ABDOMEN: Soft , decreased tenderness  nondistended. Bowel sounds present. No organomegaly or mass.  EXTREMITIES: No cyanosis, clubbing or edema b/l.    NEUROLOGIC: Cranial nerves II through XII are intact. No focal Motor or sensory deficits b/l.   PSYCHIATRIC: The patient is alert and oriented x 3.  SKIN: No obvious rash, lesion, or ulcer.   LABORATORY PANEL:   CBC Recent Labs  Lab 02/15/18 0419  WBC 24.5*  HGB 7.1*  HCT 22.9*  PLT 313   ------------------------------------------------------------------------------------------------------------------ Chemistries  Recent Labs  Lab 02/15/18 0419  NA 129*  K 6.1*  CL 101  CO2 14*  GLUCOSE 99  BUN 79*  CREATININE 5.08*  CALCIUM 6.9*   ------------------------------------------------------------------------------------------------------------------  Cardiac Enzymes No results for input(s): TROPONINI in the last 168 hours. ------------------------------------------------------------------------------------------------------------------  RADIOLOGY:  Ct Renal Stone Study  Result Date: 02/14/2018 CLINICAL DATA:  62 y/o F; leukocytosis and elevated creatinine. Left flank pain. Nausea and vomiting. EXAM: CT ABDOMEN AND PELVIS WITHOUT CONTRAST TECHNIQUE: Multidetector CT imaging of the abdomen and pelvis was performed following the standard protocol without IV contrast. COMPARISON:  10/02/2013 abdominal ultrasound. FINDINGS: Lower chest: Right lung base atelectasis. Trace left effusion. Mild coronary artery calcific atherosclerosis. Hepatobiliary: No focal liver abnormality is seen. Cholelithiasis. No gallbladder wall thickening. No biliary ductal dilatation. Pancreas: Unremarkable. No pancreatic ductal dilatation or surrounding inflammatory changes. Spleen: Normal in size without focal abnormality. Adrenals/Urinary Tract: Normal adrenal glands. Normal right  kidney. No urinary stone disease identified. The left kidney is enlarged, there is extensive left-sided perinephric stranding, and there is mild left hydronephrosis. No obstructing  stone or mass identified. Bladder wall thickening. Stomach/Bowel: Stomach is within normal limits. Appendix appears normal. No evidence of bowel wall thickening, distention, or inflammatory changes. Vascular/Lymphatic: Aortic atherosclerosis. No enlarged abdominal or pelvic lymph nodes. Reproductive: Uterus and bilateral adnexa are unremarkable. Other: No abdominal wall hernia or abnormality. No abdominopelvic ascites. Musculoskeletal: L5-S1 grade 1 anterolisthesis and chronic L5 pars defects. No acute fracture identified. IMPRESSION: 1. Enlarged left kidney, left perinephric stranding, and mild left hydronephrosis. Bladder wall thickening. No obstructing stone or mass identified. Findings probably represent infection of the renal collecting system, less likely recently passed stone. 2. Cholelithiasis. 3. Mild coronary artery and aortic calcific atherosclerosis. Electronically Signed   By: Kristine Garbe M.D.   On: 02/14/2018 20:11     ASSESSMENT AND PLAN:   62 year old female patient with with history of hypertension, hyperlipidemia, type 2 diabetes mellitus currently under hospitalist service for abdominal pain  -Sepsis secondary to pyelonephritis  IV fluids Follow-up cultures and lactic acid Continue IV Zosyn antibiotic  -Acute pyelonephritis Aggressive IV fluids and antibiotics Continue IV zosyn  -Acute kidney injury and renal failure Nephrology consultation IV fluids Renal ultrasound  -Acute hyperkalemia Oral Kayexalate 30 gram now IV sodium bicarbonate and IV calcium gluconate Follow potassium level closely Cardiac monitoring  -Cholelithiasis Liver function tests normal No acute intervention needed  -Leukocytosis improving Continue IV antibiotics  -Hyponatremia IV fluids  All the  records are reviewed and case discussed with Care Management/Social Worker. Management plans discussed with the patient, family and they are in agreement.  CODE STATUS: Full code  DVT Prophylaxis: SCDs  TOTAL TIME TAKING CARE OF THIS PATIENT: 35 minutes.   POSSIBLE D/C IN  3 to 4 DAYS, DEPENDING ON CLINICAL CONDITION.  Saundra Shelling M.D on 02/15/2018 at 1:08 PM  Between 7am to 6pm - Pager - [phone]  After 6pm go to [email or web address] - password [email or web address]  Office  [phone]  CC: Primary care physician; Petra Kuba, MD  Note: This dictation was prepared with Dragon dictation along with smaller phrase technology. Any transcriptional errors that result from this process are unintentional.

## 2018-02-15 NOTE — Progress Notes (Signed)
Advanced care plan.  Purpose of the Encounter: CODE STATUS  Parties in Attendance: Patient and family  Patient's Decision Capacity: Good  Subjective/Patient's story: Presented to emergency room for abnormal blood work and abdominal discomfort   Objective/Medical story Has acute pyelonephritis and kidney infection Needs IV antibiotics and fluids   Goals of care determination:  Advance care directives and goals of care discussed Patient wants everything done which includes cpr, intubation and ventilator if need arises.   CODE STATUS: Full code   Time spent discussing advanced care planning: 16 minutes

## 2018-02-15 NOTE — Consult Note (Signed)
Date: 02/15/2018                  Patient Name:  Ineze Serrao  MRN: [id number]  DOB: 11/20/1955  Age / Sex: 62 y.o., female         PCP: Petra Kuba, MD                 Service Requesting Consult: IM/ Saundra Shelling, MD                 Reason for Consult: ARF            History of Present Illness: Patient is a 62 y.o. female with medical problems of Diabetes,HTN, HLD who was admitted to [facility] on 02/14/2018 for evaluation of Left Flank pain. Diagnosed with pyelonephritis and admitted for further evaluation and management Only outpatient creatinine available is from 2015 which was 0.80 Admission creatinine was 5.30 Today's creatinine improved slightly to 5.08 however potassium is elevated at 6.1 CT scan of the abdomen shows enlarged left kidney perinephric stranding.  No stone or mass was identified. Patient is currently on her way to ultrasound.  She has shaking chills Information obtained from nurse and granddaughter They report that patient had a bowel movement earlier and voided some urine also.  Medications: Outpatient medications: Medications Prior to Admission  Medication Sig Dispense Refill Last Dose  . amLODipine (NORVASC) 2.5 MG tablet Take 2.5 mg by mouth daily.   02/14/2018 at 0800  . aspirin 81 MG tablet Take 81 mg by mouth daily.   02/14/2018 at 0800  . atorvastatin (LIPITOR) 40 MG tablet Take 40 mg by mouth at bedtime.    02/14/2018 at 1800  . cetirizine (ZYRTEC) 10 MG tablet Take 10 mg by mouth at bedtime.   02/14/2018 at 1800  . insulin detemir (LEVEMIR) 100 UNIT/ML injection Inject 20-25 Units into the skin See admin instructions. Inject 25u under the skin every morning and inject 20u under the skin ever evening   02/14/2018 at 1800  . losartan-hydrochlorothiazide (HYZAAR) 100-25 MG tablet Take 1 tablet by mouth daily.   02/14/2018 at 0800  . metFORMIN (GLUCOPHAGE-XR) 500 MG 24 hr tablet Take 1,000 mg by mouth 2 (two) times daily.   02/14/2018 at  1800  . metoprolol tartrate (LOPRESSOR) 25 MG tablet Take 25 mg by mouth 2 (two) times daily.   02/14/2018 at 1800    Current medications: Current Facility-Administered Medications  Medication Dose Route Frequency Provider Last Rate Last Dose  . 0.9 %  sodium chloride infusion   Intravenous Continuous Saundra Shelling, MD 125 mL/hr at 02/15/18 1336    . acetaminophen (TYLENOL) tablet 650 mg  650 mg Oral Q6H PRN Harrie Foreman, MD   650 mg at 02/15/18 0022   Or  . acetaminophen (TYLENOL) suppository 650 mg  650 mg Rectal Q6H PRN Harrie Foreman, MD      . amLODipine (NORVASC) tablet 2.5 mg  2.5 mg Oral Daily Harrie Foreman, MD      . aspirin EC tablet 81 mg  81 mg Oral Daily Harrie Foreman, MD   81 mg at 02/15/18 1001  . atorvastatin (LIPITOR) tablet 40 mg  40 mg Oral QHS Harrie Foreman, MD      . calcium gluconate 1 g/ 50 mL sodium chloride IVPB  1 g Intravenous Once Pyreddy, Pavan, MD      . docusate sodium (COLACE) capsule 100 mg  100 mg Oral BID Marcille Blanco,  Norva Riffle, MD   100 mg at 02/15/18 1001  . heparin injection 5,000 Units  5,000 Units Subcutaneous Q8H Harrie Foreman, MD   5,000 Units at 02/15/18 1217  . insulin aspart (novoLOG) injection 0-9 Units  0-9 Units Subcutaneous TID WC Harrie Foreman, MD   2 Units at 02/15/18 1215  . insulin glargine (LANTUS) injection 28 Units  28 Units Subcutaneous QHS Harrie Foreman, MD   Stopped at 02/14/18 2234  . loratadine (CLARITIN) tablet 10 mg  10 mg Oral Daily Harrie Foreman, MD      . meropenem [facility]) 500 mg in sodium chloride 0.9 % 100 mL IVPB  500 mg Intravenous Q24H Oswald Hillock, RPH      . metoprolol tartrate (LOPRESSOR) tablet 25 mg  25 mg Oral BID Harrie Foreman, MD      . ondansetron [facility]) tablet 4 mg  4 mg Oral Q6H PRN Harrie Foreman, MD       Or  . ondansetron [facility]) injection 4 mg  4 mg Intravenous Q6H PRN Harrie Foreman, MD      . oxyCODONE (Oxy IR/ROXICODONE) immediate release  tablet 5 mg  5 mg Oral Q4H PRN Pyreddy, Reatha Harps, MD      . sodium bicarbonate injection 50 mEq  50 mEq Intravenous Once Pyreddy, Pavan, MD      . sodium chloride 0.9 % bolus 1,000 mL  1,000 mL Intravenous Once Pyreddy, Pavan, MD      . sodium polystyrene (KAYEXALATE) 15 GM/60ML suspension 30 g  30 g Oral Once Saundra Shelling, MD          Allergies: No Known Allergies    Past Medical History: Past Medical History:  Diagnosis Date  . Carotid stenosis   . Diabetes mellitus without complication ([place])   . Hypercholesterolemia   . Hypertension   . Myocardial infarction ([place])   . Tachycardia      Past Surgical History: Past Surgical History:  Procedure Laterality Date  . CARDIAC CATHETERIZATION    . COLONOSCOPY WITH PROPOFOL N/A 10/02/2015   Procedure: COLONOSCOPY WITH PROPOFOL;  Surgeon: Manya Silvas, MD;  Location: [facility] ENDOSCOPY;  Service: Endoscopy;  Laterality: N/A;  . KNEE SURGERY    . TUBAL LIGATION       Family History: No family history on file.   Social History: Social History   Socioeconomic History  . Marital status: Married    Spouse name: Not on file  . Number of children: Not on file  . Years of education: Not on file  . Highest education level: Not on file  Occupational History  . Not on file  Social Needs  . Financial resource strain: Not on file  . Food insecurity:    Worry: Not on file    Inability: Not on file  . Transportation needs:    Medical: Not on file    Non-medical: Not on file  Tobacco Use  . Smoking status: Never Smoker  . Smokeless tobacco: Never Used  Substance and Sexual Activity  . Alcohol use: No  . Drug use: No  . Sexual activity: Not on file  Lifestyle  . Physical activity:    Days per week: Not on file    Minutes per session: Not on file  . Stress: Not on file  Relationships  . Social connections:    Talks on phone: Not on file    Gets together: Not on file    Attends religious  service: Not on file    Active  member of club or organization: Not on file    Attends meetings of clubs or organizations: Not on file    Relationship status: Not on file  . Intimate partner violence:    Fear of current or ex partner: Not on file    Emotionally abused: Not on file    Physically abused: Not on file    Forced sexual activity: Not on file  Other Topics Concern  . Not on file  Social History Narrative  . Not on file     Review of Systems: Limited due to patient's acute illness Gen:  HEENT:  CV:  Resp:  GI: GU :  MS:  Derm:   Psych: Heme:  Neuro:  Endocrine  Vital Signs: Blood pressure (!) 129/102, pulse (!) 119, temperature 98.1 F (36.7 C), temperature source Oral, resp. rate (!) 23, height 4\' 11"  (1.499 m), weight 49.1 kg, SpO2 96 %.   Intake/Output Summary (Last 24 hours) at 02/15/2018 1337 Last data filed at 02/15/2018 0442 Gross per 24 hour  Intake 1614.85 ml  Output 200 ml  Net 1414.85 ml    Weight trends: Filed Weights   02/14/18 1834 02/15/18 0442 02/15/18 0500  Weight: 47.6 kg 49.1 kg 49.1 kg    Physical Exam: General:  Ill-appearing, shaking chills  HEENT  dry oral mucous membranes  Neck:  Supple  Lungs:  Normal breathing effort, clear  Heart::  Tachycardic, regular  Abdomen:  Soft, nontender  Extremities:  No edema  Neurologic:  Alert, able to follow commands  Skin:  No acute rashes             Lab results: Basic Metabolic Panel: Recent Labs  Lab 02/14/18 1844 02/15/18 0419  NA 130* 129*  K 5.1 6.1*  CL 95* 101  CO2 19* 14*  GLUCOSE 148* 99  BUN 78* 79*  CREATININE 5.30* 5.08*  CALCIUM 8.1* 6.9*    Liver Function Tests: No results for input(s): AST, ALT, ALKPHOS, BILITOT, PROT, ALBUMIN in the last 168 hours. No results for input(s): LIPASE, AMYLASE in the last 168 hours. No results for input(s): AMMONIA in the last 168 hours.  CBC: Recent Labs  Lab 02/14/18 1844 02/15/18 0419  WBC 28.7* 24.5*  HGB 8.9* 7.1*  HCT 27.1* 22.9*  MCV  88.3 93.1  PLT 314 313    Cardiac Enzymes: No results for input(s): CKTOTAL, TROPONINI in the last 168 hours.  BNP: Invalid input(s): POCBNP  CBG: Recent Labs  Lab 02/14/18 2207 02/15/18 0739 02/15/18 1147  GLUCAP 90 111* 193*    Microbiology: No results found for this or any previous visit (from the past 720 hour(s)).   Coagulation Studies: No results for input(s): LABPROT, INR in the last 72 hours.  Urinalysis: Recent Labs    02/14/18 1844  COLORURINE YELLOW*  LABSPEC 1.011  PHURINE 5.0  GLUCOSEU NEGATIVE  HGBUR LARGE*  BILIRUBINUR NEGATIVE  KETONESUR NEGATIVE  PROTEINUR 100*  NITRITE NEGATIVE  LEUKOCYTESUR LARGE*        Imaging: Ct Renal Stone Study  Result Date: 02/14/2018 CLINICAL DATA:  62 y/o F; leukocytosis and elevated creatinine. Left flank pain. Nausea and vomiting. EXAM: CT ABDOMEN AND PELVIS WITHOUT CONTRAST TECHNIQUE: Multidetector CT imaging of the abdomen and pelvis was performed following the standard protocol without IV contrast. COMPARISON:  10/02/2013 abdominal ultrasound. FINDINGS: Lower chest: Right lung base atelectasis. Trace left effusion. Mild coronary artery calcific atherosclerosis. Hepatobiliary: No focal liver abnormality  is seen. Cholelithiasis. No gallbladder wall thickening. No biliary ductal dilatation. Pancreas: Unremarkable. No pancreatic ductal dilatation or surrounding inflammatory changes. Spleen: Normal in size without focal abnormality. Adrenals/Urinary Tract: Normal adrenal glands. Normal right kidney. No urinary stone disease identified. The left kidney is enlarged, there is extensive left-sided perinephric stranding, and there is mild left hydronephrosis. No obstructing stone or mass identified. Bladder wall thickening. Stomach/Bowel: Stomach is within normal limits. Appendix appears normal. No evidence of bowel wall thickening, distention, or inflammatory changes. Vascular/Lymphatic: Aortic atherosclerosis. No enlarged  abdominal or pelvic lymph nodes. Reproductive: Uterus and bilateral adnexa are unremarkable. Other: No abdominal wall hernia or abnormality. No abdominopelvic ascites. Musculoskeletal: L5-S1 grade 1 anterolisthesis and chronic L5 pars defects. No acute fracture identified. IMPRESSION: 1. Enlarged left kidney, left perinephric stranding, and mild left hydronephrosis. Bladder wall thickening. No obstructing stone or mass identified. Findings probably represent infection of the renal collecting system, less likely recently passed stone. 2. Cholelithiasis. 3. Mild coronary artery and aortic calcific atherosclerosis. Electronically Signed   By: Kristine Garbe M.D.   On: 02/14/2018 20:11      Assessment & Plan: Pt is a 62 y.o. Hispanic  female with diabetes, hypertension, coronary atherosclerosis was admitted on 02/14/2018 with cute pyelonephritis.   1.  Acute renal failure 2.  Hyperkalemia 3.  Acute pyelonephritis  The only baseline creatinine information available at this time is 0.80 from July 2015.  Admission creatinine yesterday was 5.30 which has slightly improved to 5.08 today.  Potassium however is elevated at 6.1.  Patient was given shifting measures for potassium earlier today.  Continue to monitor closely.  Because of her critical illness, elevated lactic acid and sepsis, she is being referred to ICU for further monitoring. -Agree with holding losartan/HCTZ and metformin for now - Repeat potassium in a couple hours.  If it is trending upwards, may need renal replacement therapy otherwise would continue to monitor closely.  Antibiotics as per ICU/in the medicine team. - Recommend urology evaluation     LOS: 1 Wei [place] Candiss Norse 11/27/20191:37 PM  [place], [place]  Note: This note was prepared with Dragon dictation. Any transcription errors are unintentional

## 2018-02-16 ENCOUNTER — Inpatient Hospital Stay: Payer: 59

## 2018-02-16 DIAGNOSIS — R945 Abnormal results of liver function studies: Secondary | ICD-10-CM

## 2018-02-16 DIAGNOSIS — R6521 Severe sepsis with septic shock: Secondary | ICD-10-CM

## 2018-02-16 DIAGNOSIS — N12 Tubulo-interstitial nephritis, not specified as acute or chronic: Secondary | ICD-10-CM

## 2018-02-16 DIAGNOSIS — N132 Hydronephrosis with renal and ureteral calculous obstruction: Secondary | ICD-10-CM

## 2018-02-16 DIAGNOSIS — N171 Acute kidney failure with acute cortical necrosis: Secondary | ICD-10-CM

## 2018-02-16 DIAGNOSIS — J9600 Acute respiratory failure, unspecified whether with hypoxia or hypercapnia: Secondary | ICD-10-CM

## 2018-02-16 DIAGNOSIS — R7989 Other specified abnormal findings of blood chemistry: Secondary | ICD-10-CM

## 2018-02-16 DIAGNOSIS — N179 Acute kidney failure, unspecified: Secondary | ICD-10-CM

## 2018-02-16 DIAGNOSIS — J9601 Acute respiratory failure with hypoxia: Secondary | ICD-10-CM

## 2018-02-16 LAB — BASIC METABOLIC PANEL
Anion gap: 25 — ABNORMAL HIGH (ref 5–15)
Anion gap: 27 — ABNORMAL HIGH (ref 5–15)
BUN: 73 mg/dL — AB (ref 8–23)
BUN: 78 mg/dL — AB (ref 8–23)
CHLORIDE: 98 mmol/L (ref 98–111)
CO2: 15 mmol/L — AB (ref 22–32)
CO2: 18 mmol/L — ABNORMAL LOW (ref 22–32)
CREATININE: 5.1 mg/dL — AB (ref 0.44–1.00)
Calcium: 6.1 mg/dL — CL (ref 8.9–10.3)
Calcium: 6.1 mg/dL — CL (ref 8.9–10.3)
Chloride: 93 mmol/L — ABNORMAL LOW (ref 98–111)
Creatinine, Ser: 4.95 mg/dL — ABNORMAL HIGH (ref 0.44–1.00)
GFR calc Af Amer: 10 mL/min — ABNORMAL LOW (ref >60)
GFR calc non Af Amer: 9 mL/min — ABNORMAL LOW (ref >60)
GFR, EST AFRICAN AMERICAN: 10 mL/min — AB (ref >60)
GFR, EST NON AFRICAN AMERICAN: 8 mL/min — AB (ref >60)
GLUCOSE: 140 mg/dL — AB (ref 70–99)
Glucose, Bld: 178 mg/dL — ABNORMAL HIGH (ref 70–99)
POTASSIUM: 3.5 mmol/L (ref 3.5–5.1)
POTASSIUM: 3.4 mmol/L — AB (ref 3.5–5.1)
SODIUM: 138 mmol/L (ref 135–145)
Sodium: 138 mmol/L (ref 135–145)

## 2018-02-16 LAB — CBC
HCT: 20.1 % — ABNORMAL LOW (ref 36.0–46.0)
HEMOGLOBIN: 6.3 g/dL — AB (ref 12.0–15.0)
MCH: 28.1 pg (ref 26.0–34.0)
MCHC: 31.3 g/dL (ref 30.0–36.0)
MCV: 89.7 fL (ref 80.0–100.0)
Platelets: 197 10*3/uL (ref 150–400)
RBC: 2.24 MIL/uL — AB (ref 3.87–5.11)
RDW: 15.1 % (ref 11.5–15.5)
WBC: 27.7 10*3/uL — ABNORMAL HIGH (ref 4.0–10.5)
nRBC: 0 % (ref 0.0–0.2)

## 2018-02-16 LAB — PHOSPHORUS
PHOSPHORUS: 6.6 mg/dL — AB (ref 2.5–4.6)
Phosphorus: 6.6 mg/dL — ABNORMAL HIGH (ref 2.5–4.6)

## 2018-02-16 LAB — HEMOGLOBIN AND HEMATOCRIT, BLOOD
HCT: 23.9 % — ABNORMAL LOW (ref 36.0–46.0)
HCT: 24.1 % — ABNORMAL LOW (ref 36.0–46.0)
Hemoglobin: 7.9 g/dL — ABNORMAL LOW (ref 12.0–15.0)
Hemoglobin: 7.9 g/dL — ABNORMAL LOW (ref 12.0–15.0)

## 2018-02-16 LAB — BLOOD GAS, VENOUS
ACID-BASE DEFICIT: 7.7 mmol/L — AB (ref 0.0–2.0)
Bicarbonate: 16.4 mmol/L — ABNORMAL LOW (ref 20.0–28.0)
O2 Saturation: 76.4 %
PO2 VEN: 43 mmHg (ref 32.0–45.0)
Patient temperature: 37
pCO2, Ven: 29 mmHg — ABNORMAL LOW (ref 44.0–60.0)
pH, Ven: 7.36 (ref 7.250–7.430)

## 2018-02-16 LAB — CBC WITH DIFFERENTIAL/PLATELET
Abs Immature Granulocytes: 0.89 10*3/uL — ABNORMAL HIGH (ref 0.00–0.07)
Basophils Absolute: 0.1 10*3/uL (ref 0.0–0.1)
Basophils Relative: 1 %
Eosinophils Absolute: 0 10*3/uL (ref 0.0–0.5)
Eosinophils Relative: 0 %
HCT: 24.1 % — ABNORMAL LOW (ref 36.0–46.0)
Hemoglobin: 8 g/dL — ABNORMAL LOW (ref 12.0–15.0)
Immature Granulocytes: 3 %
Lymphocytes Relative: 3 %
Lymphs Abs: 0.7 10*3/uL (ref 0.7–4.0)
MCH: 27.1 pg (ref 26.0–34.0)
MCHC: 33.2 g/dL (ref 30.0–36.0)
MCV: 81.7 fL (ref 80.0–100.0)
Monocytes Absolute: 0.7 10*3/uL (ref 0.1–1.0)
Monocytes Relative: 3 %
NEUTROS PCT: 90 %
Neutro Abs: 23.7 10*3/uL — ABNORMAL HIGH (ref 1.7–7.7)
Platelets: 172 10*3/uL (ref 150–400)
RBC: 2.95 MIL/uL — AB (ref 3.87–5.11)
RDW: 19.5 % — ABNORMAL HIGH (ref 11.5–15.5)
WBC: 26.2 10*3/uL — ABNORMAL HIGH (ref 4.0–10.5)
nRBC: 0 % (ref 0.0–0.2)

## 2018-02-16 LAB — MAGNESIUM
MAGNESIUM: 1.7 mg/dL (ref 1.7–2.4)
Magnesium: 1.7 mg/dL (ref 1.7–2.4)
Magnesium: 1.7 mg/dL (ref 1.7–2.4)

## 2018-02-16 LAB — BLOOD GAS, ARTERIAL
ACID-BASE DEFICIT: 10.9 mmol/L — AB (ref 0.0–2.0)
ACID-BASE DEFICIT: 13.1 mmol/L — AB (ref 0.0–2.0)
BICARBONATE: 12.1 mmol/L — AB (ref 20.0–28.0)
Bicarbonate: 10.3 mmol/L — ABNORMAL LOW (ref 20.0–28.0)
DELIVERY SYSTEMS: POSITIVE
Delivery systems: POSITIVE
EXPIRATORY PAP: 5
Expiratory PAP: 5
FIO2: 0.4
FIO2: 0.4
Inspiratory PAP: 12
Inspiratory PAP: 12
MECHANICAL RATE: 10
MECHANICAL RATE: 10
O2 SAT: 96.8 %
O2 Saturation: 97.8 %
PATIENT TEMPERATURE: 37
PH ART: 7.34 — AB (ref 7.350–7.450)
PO2 ART: 91 mmHg (ref 83.0–108.0)
Patient temperature: 37
pCO2 arterial: 19 mmHg — CL (ref 32.0–48.0)
pCO2 arterial: 21 mmHg — ABNORMAL LOW (ref 32.0–48.0)
pH, Arterial: 7.37 (ref 7.350–7.450)
pO2, Arterial: 107 mmHg (ref 83.0–108.0)

## 2018-02-16 LAB — COMPREHENSIVE METABOLIC PANEL
ALK PHOS: 186 U/L — AB (ref 38–126)
ALT: 216 U/L — ABNORMAL HIGH (ref 0–44)
AST: 544 U/L — ABNORMAL HIGH (ref 15–41)
Albumin: 3.4 g/dL — ABNORMAL LOW (ref 3.5–5.0)
Anion gap: 27 — ABNORMAL HIGH (ref 5–15)
BUN: 80 mg/dL — ABNORMAL HIGH (ref 8–23)
CO2: 18 mmol/L — AB (ref 22–32)
Calcium: 6.1 mg/dL — CL (ref 8.9–10.3)
Chloride: 93 mmol/L — ABNORMAL LOW (ref 98–111)
Creatinine, Ser: 5.1 mg/dL — ABNORMAL HIGH (ref 0.44–1.00)
GFR calc Af Amer: 10 mL/min — ABNORMAL LOW (ref >60)
GFR calc non Af Amer: 8 mL/min — ABNORMAL LOW (ref >60)
Glucose, Bld: 181 mg/dL — ABNORMAL HIGH (ref 70–99)
Potassium: 3.4 mmol/L — ABNORMAL LOW (ref 3.5–5.1)
SODIUM: 138 mmol/L (ref 135–145)
Total Bilirubin: 1.6 mg/dL — ABNORMAL HIGH (ref 0.3–1.2)
Total Protein: 5.6 g/dL — ABNORMAL LOW (ref 6.5–8.1)

## 2018-02-16 LAB — C DIFFICILE QUICK SCREEN W PCR REFLEX
C DIFFICILE (CDIFF) INTERP: NOT DETECTED
C DIFFICLE (CDIFF) ANTIGEN: NEGATIVE
C Diff toxin: NEGATIVE

## 2018-02-16 LAB — GLUCOSE, CAPILLARY
Glucose-Capillary: 128 mg/dL — ABNORMAL HIGH (ref 70–99)
Glucose-Capillary: 116 mg/dL — ABNORMAL HIGH (ref 70–99)
Glucose-Capillary: 127 mg/dL — ABNORMAL HIGH (ref 70–99)
Glucose-Capillary: 146 mg/dL — ABNORMAL HIGH (ref 70–99)
Glucose-Capillary: 173 mg/dL — ABNORMAL HIGH (ref 70–99)

## 2018-02-16 LAB — ALBUMIN: Albumin: 3.3 g/dL — ABNORMAL LOW (ref 3.5–5.0)

## 2018-02-16 LAB — LACTIC ACID, PLASMA: LACTIC ACID, VENOUS: 6.4 mmol/L — AB (ref 0.5–1.9)

## 2018-02-16 LAB — PROCALCITONIN

## 2018-02-16 MED ORDER — IPRATROPIUM-ALBUTEROL 0.5-2.5 (3) MG/3ML IN SOLN
3.0000 mL | Freq: Four times a day (QID) | RESPIRATORY_TRACT | Status: DC
  Administered 2018-02-16 – 2018-02-18 (×9): 3 mL via RESPIRATORY_TRACT
  Filled 2018-02-16 (×9): qty 3

## 2018-02-16 MED ORDER — SODIUM CHLORIDE 0.9% IV SOLUTION: Freq: Once | INTRAVENOUS | Status: DC

## 2018-02-16 MED ORDER — CALCIUM GLUCONATE-NACL 1-0.675 GM/50ML-% IV SOLN
1.0000 g | Freq: Once | INTRAVENOUS | Status: AC
  Administered 2018-02-16: 1000 mg via INTRAVENOUS
  Filled 2018-02-16: qty 50

## 2018-02-16 MED ORDER — VANCOMYCIN HCL IN DEXTROSE 1-5 GM/200ML-% IV SOLN
1000.0000 mg | INTRAVENOUS | Status: DC
  Filled 2018-02-16: qty 200

## 2018-02-16 MED ORDER — ALBUMIN HUMAN 25 % IV SOLN
50.0000 g | Freq: Once | INTRAVENOUS | Status: AC
  Administered 2018-02-16: 50 g via INTRAVENOUS
  Filled 2018-02-16: qty 200

## 2018-02-16 MED ORDER — MAGNESIUM SULFATE IN D5W 1-5 GM/100ML-% IV SOLN
1.0000 g | Freq: Once | INTRAVENOUS | Status: AC
  Administered 2018-02-16: 1 g via INTRAVENOUS
  Filled 2018-02-16: qty 100

## 2018-02-16 MED ORDER — VANCOMYCIN HCL IN DEXTROSE 750-5 MG/150ML-% IV SOLN
750.0000 mg | Freq: Once | INTRAVENOUS | Status: AC
  Administered 2018-02-16: 750 mg via INTRAVENOUS
  Filled 2018-02-16: qty 150

## 2018-02-16 NOTE — Progress Notes (Signed)
Pt with expiratory wheezes throughout. Saline stopped and bicarb gtt decreased. Pt placed back on bipap and is resting more comfortably. Duoneb given, will continue to monitor.

## 2018-02-16 NOTE — Progress Notes (Signed)
Subjective: Patient reports general abdominal discomfort. She has had poor urine output the past 24 hours. Creatinine 4.95. Ct from this morning shows bilateral perinephric stranding and mild hydronephrosis without obstructive calculus. She reamins critically ill.  Objective: Vital signs in last 24 hours: Temp:  [97 F (36.1 C)-100.7 F (38.2 C)] 97 F (36.1 C) (11/28 1047) Pulse Rate:  [81-127] 94 (11/28 0700) Resp:  [16-33] 17 (11/28 0700) BP: (76-136)/(39-102) 136/56 (11/28 1048) SpO2:  [90 %-100 %] 98 % (11/28 1048) FiO2 (%):  [30 %] 30 % (11/28 1048) Weight:  [54.8 kg] 54.8 kg (11/27 1710)  Intake/Output from previous day: 11/27 0701 - 11/28 0700 In: 5809 [I.V.:4316.8; IV Piggyback:1492.2] Out: 145 [Urine:145] Intake/Output this shift: No intake/output data recorded.  Physical Exam:  General:alert, cooperative and appears stated age GI: soft, non tender, normal bowel sounds, no palpable masses, no organomegaly, no inguinal hernia Female genitalia: not done Extremities: extremities normal, atraumatic, no cyanosis or edema  Lab Results: Recent Labs    02/15/18 0419 02/15/18 1720 02/16/18 0353  HGB 7.1* 8.2* 6.3*  HCT 22.9* 27.5* 20.1*   BMET Recent Labs    02/15/18 2304 02/16/18 0353  NA 138 138  K 3.6 3.5  CL 102 98  CO2 12* 15*  GLUCOSE 155* 140*  BUN 75* 73*  CREATININE 4.96* 4.95*  CALCIUM 5.9* 6.1*   Recent Labs    02/15/18 2304  INR 1.36   No results for input(s): LABURIN in the last 72 hours. Results for orders placed or performed during the hospital encounter of 02/14/18  Urine culture     Status: Abnormal (Preliminary result)   Collection Time: 02/14/18  6:44 PM  Result Value Ref Range Status   Specimen Description   Final    URINE, RANDOM Performed at [facility], [address] [place], [place] [address]    Special Requests   Final    NONE Performed at [facility], [address], [place], [place] [address]     Culture >=100,000 COLONIES/mL GRAM NEGATIVE RODS (A)  Final   Report Status PENDING  Incomplete  MRSA PCR Screening     Status: None   Collection Time: 02/15/18  5:13 PM  Result Value Ref Range Status   MRSA by PCR NEGATIVE NEGATIVE Final    Comment:        The GeneXpert MRSA Assay (FDA approved for NASAL specimens only), is one component of a comprehensive MRSA colonization surveillance program. It is not intended to diagnose MRSA infection nor to guide or monitor treatment for MRSA infections. Performed at [facility], [address] [place], [place] [address]   CULTURE, BLOOD (ROUTINE X 2) w Reflex to ID Panel     Status: None (Preliminary result)   Collection Time: 02/15/18  7:10 PM  Result Value Ref Range Status   Specimen Description BLOOD LEFT ANTECUBITAL  Final   Special Requests   Final    BOTTLES DRAWN AEROBIC AND ANAEROBIC Blood Culture adequate volume   Culture   Final    NO GROWTH < 12 HOURS Performed at [facility], [address] [place],  [address]    Report Status PENDING  Incomplete  CULTURE, BLOOD (ROUTINE X 2) w Reflex to ID Panel     Status: None (Preliminary result)   Collection Time: 02/15/18  7:25 PM  Result Value Ref Range Status   Specimen Description BLOOD BLOOD RIGHT HAND  Final   Special Requests   Final  BOTTLES DRAWN AEROBIC AND ANAEROBIC Blood Culture adequate volume   Culture   Final    NO GROWTH < 12 HOURS Performed at [facility], [address] [place], [place] [address]    Report Status PENDING  Incomplete  C difficile quick scan w PCR reflex     Status: None   Collection Time: 02/16/18  5:20 AM  Result Value Ref Range Status   C Diff antigen NEGATIVE NEGATIVE Final   C Diff toxin NEGATIVE NEGATIVE Final   C Diff interpretation No C. difficile detected.  Final    Comment: Performed at [facility], [address] [place], [place] [address]    Studies/Results: Ct Abdomen  Pelvis Wo Contrast  Result Date: 02/16/2018 CLINICAL DATA:  Unexplained anemia. Recent sepsis. A CT scan from February 14, 2018 demonstrated an abnormal left kidney thought to represent infection of the renal collecting system with a recently passed stone considered less likely. EXAM: CT ABDOMEN AND PELVIS WITHOUT CONTRAST TECHNIQUE: Multidetector CT imaging of the abdomen and pelvis was performed following the standard protocol without IV contrast. COMPARISON:  February 14, 2018 CT scan FINDINGS: Lower chest: Bilateral pleural effusions are identified, larger on the left and new on the right. Opacities underlying the effusions are likely atelectasis with infiltrate less likely. Coronary artery calcifications are noted. The lower chest is otherwise unremarkable. Hepatobiliary: The liver is unremarkable. The gallbladder is distended and contains a single stone. The gallbladder was better assessed with an ultrasound from this morning. Pancreas: Unremarkable. No pancreatic ductal dilatation or surrounding inflammatory changes. Spleen: Normal in size without focal abnormality. Adrenals/Urinary Tract: Adrenal glands are normal. No renal stones identified. No hydronephrosis on the right. There is persistent hydronephrosis on the left. There is bilateral perinephric stranding. There is mild left ureterectasis. The right ureter is normal. No ureteral stones. The bladder is decompressed with a Foley catheter but it remains mildly thick walled. There is air in the bladder from the Foley catheter. Stomach/Bowel: The colon is unremarkable.  The appendix is normal. Vascular/Lymphatic: Atherosclerotic changes are seen in the nonaneurysmal aorta. No adenopathy. Reproductive: Uterus and bilateral adnexa are unremarkable. Other: There is increased attenuation diffusely throughout the subcutaneous fat. There is also increased attenuation in the intra-abdominal fat as well as mild ascites extending down the pericolic gutters into  the pelvis, new. No free air. Musculoskeletal: No acute or significant osseous findings. IMPRESSION: 1. Left-sided hydronephrosis persists. On the previous study, there was increased perinephric stranding on the left as well compared to the right. I suspect the hydronephrosis on the left is likely acute to subacute although is strictly speaking age indeterminate. I doubt the findings are from a recently passed stone as there has been no significant change since February 14, 2018. An underlying obstruction which is not radiopaque such as a blood clot or urothelial lesion are possible. Infection of the left renal collecting system is also possible. Recommend clinical correlation and correlation with a urinalysis. 2. The gallbladder is distended with a single stone. If there is concern for acute cholecystitis, recommend HIDA scan. 3. Bilateral pleural effusions, increased attenuation in the subcutaneous fat, and increased attenuation in the intra-abdominal fat all suggest third spacing of fluid/edema. 4. The increased perinephric stranding on the right may be due to the overall increased third-spacing of fluid/edema. No hydronephrosis or stones seen on the right. 5. Opacity under the pleural effusions is likely atelectasis. 6. Coronary artery calcifications and abdominal aorta atherosclerotic change. Electronically Signed  By: Dorise Bullion III M.D   On: 02/16/2018 09:29   Dg Chest 1 View  Result Date: 02/15/2018 CLINICAL DATA:  62 y/o  F; encounter for central line placement. EXAM: CHEST  1 VIEW COMPARISON:  02/15/2018 chest radiograph. FINDINGS: Stable cardiomegaly given projection and technique. Aortic calcific atherosclerosis. Left central venous catheter tip projects over the upper SVC. No pneumothorax. No pleural effusion, consolidation, or pneumothorax. Pulmonary venous hypertension. No acute osseous abnormality is evident. IMPRESSION: 1. Left central venous catheter tip projects over the upper SVC. 2.  Cardiomegaly and pulmonary venous hypertension. Electronically Signed   By: Kristine Garbe M.D.   On: 02/15/2018 22:41   US Renal  Result Date: 02/15/2018 CLINICAL DATA:  Renal failure. EXAM: RENAL / URINARY TRACT ULTRASOUND COMPLETE COMPARISON:  CT abdomen and pelvis 02/14/2018 FINDINGS: Right Kidney: Renal measurements: 8.7 x 4.3 x 5.0 cm = volume: 98 mL . Echogenicity within normal limits. No mass or hydronephrosis visualized. Left Kidney: Renal measurements: 13.2 x 6.6 x 6.0 cm = volume: 272 mL. Mild hydronephrosis without mass or perinephric fluid collection identified. Bladder: Collapsed bladder, poorly evaluated. IMPRESSION: Persistent asymmetric enlargement of the left kidney with mild left hydronephrosis, similar to yesterday's CT. Electronically Signed   By: Logan Bores M.D.   On: 02/15/2018 14:32   Dg Chest Port 1 View  Result Date: 02/16/2018 CLINICAL DATA:  Follow-up pneumonia EXAM: PORTABLE CHEST 1 VIEW COMPARISON:  02/15/2018 FINDINGS: Cardiac shadow remains enlarged. Left jugular central line is again seen in the mid superior vena cava. No pneumothorax is noted. Increasing pulmonary vascular congestion with interstitial edema is seen. No focal confluent infiltrate or effusion is noted. IMPRESSION: Increasing changes of CHF. Electronically Signed   By: Inez Catalina M.D.   On: 02/16/2018 07:44   Dg Chest Port 1 View  Result Date: 02/15/2018 CLINICAL DATA:  Acute respiratory failure EXAM: PORTABLE CHEST 1 VIEW COMPARISON:  10/01/2013, CT chest 09/22/2015 FINDINGS: Cardiomegaly. Bibasilar atelectasis. No pleural effusion or pneumothorax. IMPRESSION: Cardiomegaly with bibasilar atelectasis Electronically Signed   By: Donavan Foil M.D.   On: 02/15/2018 17:46   Ct Renal Stone Study  Result Date: 02/14/2018 CLINICAL DATA:  62 y/o F; leukocytosis and elevated creatinine. Left flank pain. Nausea and vomiting. EXAM: CT ABDOMEN AND PELVIS WITHOUT CONTRAST TECHNIQUE: Multidetector  CT imaging of the abdomen and pelvis was performed following the standard protocol without IV contrast. COMPARISON:  10/02/2013 abdominal ultrasound. FINDINGS: Lower chest: Right lung base atelectasis. Trace left effusion. Mild coronary artery calcific atherosclerosis. Hepatobiliary: No focal liver abnormality is seen. Cholelithiasis. No gallbladder wall thickening. No biliary ductal dilatation. Pancreas: Unremarkable. No pancreatic ductal dilatation or surrounding inflammatory changes. Spleen: Normal in size without focal abnormality. Adrenals/Urinary Tract: Normal adrenal glands. Normal right kidney. No urinary stone disease identified. The left kidney is enlarged, there is extensive left-sided perinephric stranding, and there is mild left hydronephrosis. No obstructing stone or mass identified. Bladder wall thickening. Stomach/Bowel: Stomach is within normal limits. Appendix appears normal. No evidence of bowel wall thickening, distention, or inflammatory changes. Vascular/Lymphatic: Aortic atherosclerosis. No enlarged abdominal or pelvic lymph nodes. Reproductive: Uterus and bilateral adnexa are unremarkable. Other: No abdominal wall hernia or abnormality. No abdominopelvic ascites. Musculoskeletal: L5-S1 grade 1 anterolisthesis and chronic L5 pars defects. No acute fracture identified. IMPRESSION: 1. Enlarged left kidney, left perinephric stranding, and mild left hydronephrosis. Bladder wall thickening. No obstructing stone or mass identified. Findings probably represent infection of the renal collecting system, less likely recently passed stone.  2. Cholelithiasis. 3. Mild coronary artery and aortic calcific atherosclerosis. Electronically Signed   By: Kristine Garbe M.D.   On: 02/14/2018 20:11   [place] Ekg Site Rite  Result Date: 02/15/2018 If Site Rite image not attached, placement could not be confirmed due to current cardiac rhythm.  US Abdomen Limited Ruq  Result Date: 02/16/2018 CLINICAL  DATA:  Elevated LFTs. EXAM: ULTRASOUND ABDOMEN LIMITED RIGHT UPPER QUADRANT COMPARISON:  CT 02/14/2018 FINDINGS: Gallbladder: Distended containing intraluminal sludge and shadowing 1.2 cm gallstone. Mild gallbladder wall thickening of 3.6 mm. No sonographic Murphy sign noted by sonographer. Common bile duct: Diameter: 4 mm, normal. Liver: No focal lesion identified. Within normal limits in parenchymal echogenicity. Portal vein is patent on color Doppler imaging with normal direction of blood flow towards the liver. Right pleural effusion incidentally noted. Trace perihepatic fluid inferiorly. IMPRESSION: 1. Gallbladder distention containing intraluminal sludge and stone. Borderline mild gallbladder wall thickening. Findings could represent acute cholecystitis in the appropriate clinical setting. Consider nuclear medicine hepatic biliary scan based on clinical concern. 2. No biliary dilatation. No focal hepatic abnormality to explain elevated LFTs. Electronically Signed   By: Keith Rake M.D.   On: 02/16/2018 01:52    Assessment/Plan: 62yo with left mild hydronephrosis, sepsis from a urinary source  1. I discussed the various causes of mild hydronephrosis and the management with the patient and family. The mild hydronephrosis is likely related to pyelonephritis and therefore does not require ureteral stent/nephrostomy tube placement. Please continue broad spectrum antibiotics pending urine culture   LOS: 2 days   Nicolette Bang 02/16/2018, 11:03 AM

## 2018-02-16 NOTE — Progress Notes (Signed)
Name: Cathy Cline MRN: [id number] DOB: Jun 21, 1955     CONSULTATION DATE: 02/14/2018 Objective & objective: Bicarb drip + norepinephrine, BiPAP and low-grade fever 100.7  PAST MEDICAL HISTORY :   has a past medical history of Carotid stenosis, Diabetes mellitus without complication ([place]), Hypercholesterolemia, Hypertension, Myocardial infarction ([place]), and Tachycardia.  has a past surgical history that includes Cardiac catheterization; Tubal ligation; Knee surgery; and Colonoscopy with propofol (N/A, 10/02/2015). Prior to Admission medications   Medication Sig Start Date End Date Taking? Authorizing Provider  amLODipine (NORVASC) 2.5 MG tablet Take 2.5 mg by mouth daily.   Yes [provider]  aspirin 81 MG tablet Take 81 mg by mouth daily.   Yes [provider]  atorvastatin (LIPITOR) 40 MG tablet Take 40 mg by mouth at bedtime.    Yes [provider]  cetirizine (ZYRTEC) 10 MG tablet Take 10 mg by mouth at bedtime.   Yes [provider]  insulin detemir (LEVEMIR) 100 UNIT/ML injection Inject 20-25 Units into the skin See admin instructions. Inject 25u under the skin every morning and inject 20u under the skin ever evening   Yes [provider]  losartan-hydrochlorothiazide (HYZAAR) 100-25 MG tablet Take 1 tablet by mouth daily.   Yes [provider]  metFORMIN (GLUCOPHAGE-XR) 500 MG 24 hr tablet Take 1,000 mg by mouth 2 (two) times daily.   Yes [provider]  metoprolol tartrate (LOPRESSOR) 25 MG tablet Take 25 mg by mouth 2 (two) times daily.   Yes [provider]   No Known Allergies  FAMILY HISTORY:  family history is not on file. SOCIAL HISTORY:  reports that she has never smoked. She has never used smokeless tobacco. She reports that she does not drink alcohol or use drugs.  REVIEW OF SYSTEMS:   Unable to obtain due to critical illness   VITAL SIGNS: Temp:  [97.9 F (36.6 C)-100.7 F (38.2  C)] 98.4 F (36.9 C) (11/28 0755) Pulse Rate:  [81-127] 94 (11/28 0700) Resp:  [16-33] 17 (11/28 0700) BP: (76-130)/(39-102) 116/54 (11/28 0755) SpO2:  [90 %-100 %] 99 % (11/28 0722) Weight:  [54.8 kg] 54.8 kg (11/27 1710)  Physical Examination:  Awake and oriented with no acute focal neurological deficits Tolerating BiPAP, no distress, bilateral equal air entry and no adventitious sounds S1 & S2 are audible with no murmur Benign abdominal exam with deep tenderness left hypochondrium No leg edema ASSESSMENT / PLAN:   Sepsis with septic shock. procalcitonin > 150.00 -ABX + optimize hydration + pressors and monitor hemodynamics  Impending acute respiratory failure with increased work of breathing to compensate for severe metabolic acidosis -Optimize BiPAP to ease off work of breathing -Monitor ABG  AKI with oliguria due to ATN -Optimize hydration, avoid nephrotoxins, monitor renal panel and urine output. -Follow his renal consult  Left pyelonephritis.  No obstructive uropathy and CT -Meropenem and follow his urine culture  Atelectasis and pneumonia.  Right basilar and left perihilar airspace disease with pulmonary congestion. -CW meropenem, A/P Zosyn + vancomycin. -Optimize hydration -Monitor CXR + CBC + FiO2  Anemia with concern for blood loss.  Hemoglobin dropped 2 g over the last 24-hour -CT abdomen and pelvis to rule out retroperitoneal hemorrhage status post attempt left femoral central venous line. -Keep hemoglobin more than 7 g/dL  Questionable acute cholecystitis.  Less likely as per surgical evaluation to be source of sepsis and recommendation for medical management at this point with consideration for percutaneous cholecystostomy if drainage is  needed  Elevated transaminases with acute ischemic hepatitis -Optimize hemodynamics, avoid hepatotoxins and monitor LFTs  Hypocalcemia -Replete and monitor electrolytes  Full code  DVT & GI prophylaxis.  Continue with  supportive care Family was updated at the bedside  Critical care time 45 minutes

## 2018-02-16 NOTE — Progress Notes (Signed)
[facility] [place] NAME: Cathy Cline    MR#:  [id number]  DATE OF BIRTH:  1956-02-17  SUBJECTIVE:  CHIEF COMPLAINT:   Chief Complaint  Patient presents with  . Abnormal Lab  Patient seen and evaluated today Family is at bedside Decreased abdominal pain Has increased shortness of breath No fever No nausea and vomiting  REVIEW OF SYSTEMS:    ROS  CONSTITUTIONAL: No documented fever. Has fatigue, weakness. No weight gain, no weight loss.  EYES: No blurry or double vision.  ENT: No tinnitus. No postnasal drip. No redness of the oropharynx.  RESPIRATORY: No cough, no wheeze, no hemoptysis. Has dyspnea.  CARDIOVASCULAR: No chest pain. No orthopnea. No palpitations. No syncope.  GASTROINTESTINAL: Decreased nausea, decreased vomiting , no diarrhea. Decreased abdominal pain. No melena or hematochezia.  GENITOURINARY: No dysuria or hematuria.  ENDOCRINE: No polyuria or nocturia. No heat or cold intolerance.  HEMATOLOGY: No anemia. No bruising. No bleeding.  INTEGUMENTARY: No rashes. No lesions.  MUSCULOSKELETAL: No arthritis. No swelling. No gout.  NEUROLOGIC: No numbness, tingling, or ataxia. No seizure-type activity.  PSYCHIATRIC: No anxiety. No insomnia. No ADD.   DRUG ALLERGIES:  No Known Allergies  VITALS:  Blood pressure (!) 136/56, pulse 94, temperature (!) 97 F (36.1 C), temperature source Axillary, resp. rate 17, height 4\' 11"  (1.499 m), weight 54.8 kg, SpO2 98 %.  PHYSICAL EXAMINATION:   Physical Exam  GENERAL:  62 y.o.-year-old patient lying in the bed with no acute distress.  EYES: Pupils equal, round, reactive to light and accommodation. No scleral icterus. Extraocular muscles intact.  HEENT: Head atraumatic, normocephalic. Oropharynx dry and nasopharynx clear.  NECK:  Supple, no jugular venous distention. No thyroid enlargement, no tenderness.  LUNGS: Decreased breath sounds bilaterally, bilateral rhonchi  heard. No use of accessory muscles of respiration.  CARDIOVASCULAR: S1, S2 tachycardia noted. No murmurs, rubs, or gallops.  ABDOMEN: Soft , decreased tenderness  nondistended. Bowel sounds present. No organomegaly or mass.  EXTREMITIES: No cyanosis, clubbing or edema b/l.    NEUROLOGIC: Cranial nerves II through XII are intact. No focal Motor or sensory deficits b/l.   PSYCHIATRIC: The patient is alert and oriented x 3.  SKIN: No obvious rash, lesion, or ulcer.   LABORATORY PANEL:   CBC Recent Labs  Lab 02/16/18 0353  WBC 27.7*  HGB 6.3*  HCT 20.1*  PLT 197   ------------------------------------------------------------------------------------------------------------------ Chemistries  Recent Labs  Lab 02/15/18 2304 02/16/18 0353  NA 138 138  K 3.6 3.5  CL 102 98  CO2 12* 15*  GLUCOSE 155* 140*  BUN 75* 73*  CREATININE 4.96* 4.95*  CALCIUM 5.9* 6.1*  MG 1.1* 1.7  AST 54*  --   ALT 23  --   ALKPHOS 318*  --   BILITOT 0.4  --    ------------------------------------------------------------------------------------------------------------------  Cardiac Enzymes No results for input(s): TROPONINI in the last 168 hours. ------------------------------------------------------------------------------------------------------------------  RADIOLOGY:  Ct Abdomen Pelvis Wo Contrast  Result Date: 02/16/2018 CLINICAL DATA:  Unexplained anemia. Recent sepsis. A CT scan from February 14, 2018 demonstrated an abnormal left kidney thought to represent infection of the renal collecting system with a recently passed stone considered less likely. EXAM: CT ABDOMEN AND PELVIS WITHOUT CONTRAST TECHNIQUE: Multidetector CT imaging of the abdomen and pelvis was performed following the standard protocol without IV contrast. COMPARISON:  February 14, 2018 CT scan FINDINGS: Lower chest: Bilateral pleural effusions are identified, larger on the left and new  on the right. Opacities underlying the  effusions are likely atelectasis with infiltrate less likely. Coronary artery calcifications are noted. The lower chest is otherwise unremarkable. Hepatobiliary: The liver is unremarkable. The gallbladder is distended and contains a single stone. The gallbladder was better assessed with an ultrasound from this morning. Pancreas: Unremarkable. No pancreatic ductal dilatation or surrounding inflammatory changes. Spleen: Normal in size without focal abnormality. Adrenals/Urinary Tract: Adrenal glands are normal. No renal stones identified. No hydronephrosis on the right. There is persistent hydronephrosis on the left. There is bilateral perinephric stranding. There is mild left ureterectasis. The right ureter is normal. No ureteral stones. The bladder is decompressed with a Foley catheter but it remains mildly thick walled. There is air in the bladder from the Foley catheter. Stomach/Bowel: The colon is unremarkable.  The appendix is normal. Vascular/Lymphatic: Atherosclerotic changes are seen in the nonaneurysmal aorta. No adenopathy. Reproductive: Uterus and bilateral adnexa are unremarkable. Other: There is increased attenuation diffusely throughout the subcutaneous fat. There is also increased attenuation in the intra-abdominal fat as well as mild ascites extending down the pericolic gutters into the pelvis, new. No free air. Musculoskeletal: No acute or significant osseous findings. IMPRESSION: 1. Left-sided hydronephrosis persists. On the previous study, there was increased perinephric stranding on the left as well compared to the right. I suspect the hydronephrosis on the left is likely acute to subacute although is strictly speaking age indeterminate. I doubt the findings are from a recently passed stone as there has been no significant change since February 14, 2018. An underlying obstruction which is not radiopaque such as a blood clot or urothelial lesion are possible. Infection of the left renal collecting  system is also possible. Recommend clinical correlation and correlation with a urinalysis. 2. The gallbladder is distended with a single stone. If there is concern for acute cholecystitis, recommend HIDA scan. 3. Bilateral pleural effusions, increased attenuation in the subcutaneous fat, and increased attenuation in the intra-abdominal fat all suggest third spacing of fluid/edema. 4. The increased perinephric stranding on the right may be due to the overall increased third-spacing of fluid/edema. No hydronephrosis or stones seen on the right. 5. Opacity under the pleural effusions is likely atelectasis. 6. Coronary artery calcifications and abdominal aorta atherosclerotic change. Electronically Signed   By: Dorise Bullion III M.D   On: 02/16/2018 09:29   Dg Chest 1 View  Result Date: 02/15/2018 CLINICAL DATA:  62 y/o  F; encounter for central line placement. EXAM: CHEST  1 VIEW COMPARISON:  02/15/2018 chest radiograph. FINDINGS: Stable cardiomegaly given projection and technique. Aortic calcific atherosclerosis. Left central venous catheter tip projects over the upper SVC. No pneumothorax. No pleural effusion, consolidation, or pneumothorax. Pulmonary venous hypertension. No acute osseous abnormality is evident. IMPRESSION: 1. Left central venous catheter tip projects over the upper SVC. 2. Cardiomegaly and pulmonary venous hypertension. Electronically Signed   By: Kristine Garbe M.D.   On: 02/15/2018 22:41   US Renal  Result Date: 02/15/2018 CLINICAL DATA:  Renal failure. EXAM: RENAL / URINARY TRACT ULTRASOUND COMPLETE COMPARISON:  CT abdomen and pelvis 02/14/2018 FINDINGS: Right Kidney: Renal measurements: 8.7 x 4.3 x 5.0 cm = volume: 98 mL . Echogenicity within normal limits. No mass or hydronephrosis visualized. Left Kidney: Renal measurements: 13.2 x 6.6 x 6.0 cm = volume: 272 mL. Mild hydronephrosis without mass or perinephric fluid collection identified. Bladder: Collapsed bladder, poorly  evaluated. IMPRESSION: Persistent asymmetric enlargement of the left kidney with mild left hydronephrosis, similar to yesterday's CT.  Electronically Signed   By: Logan Bores M.D.   On: 02/15/2018 14:32   Dg Chest Port 1 View  Result Date: 02/16/2018 CLINICAL DATA:  Follow-up pneumonia EXAM: PORTABLE CHEST 1 VIEW COMPARISON:  02/15/2018 FINDINGS: Cardiac shadow remains enlarged. Left jugular central line is again seen in the mid superior vena cava. No pneumothorax is noted. Increasing pulmonary vascular congestion with interstitial edema is seen. No focal confluent infiltrate or effusion is noted. IMPRESSION: Increasing changes of CHF. Electronically Signed   By: Inez Catalina M.D.   On: 02/16/2018 07:44   Dg Chest Port 1 View  Result Date: 02/15/2018 CLINICAL DATA:  Acute respiratory failure EXAM: PORTABLE CHEST 1 VIEW COMPARISON:  10/01/2013, CT chest 09/22/2015 FINDINGS: Cardiomegaly. Bibasilar atelectasis. No pleural effusion or pneumothorax. IMPRESSION: Cardiomegaly with bibasilar atelectasis Electronically Signed   By: Donavan Foil M.D.   On: 02/15/2018 17:46   Ct Renal Stone Study  Result Date: 02/14/2018 CLINICAL DATA:  62 y/o F; leukocytosis and elevated creatinine. Left flank pain. Nausea and vomiting. EXAM: CT ABDOMEN AND PELVIS WITHOUT CONTRAST TECHNIQUE: Multidetector CT imaging of the abdomen and pelvis was performed following the standard protocol without IV contrast. COMPARISON:  10/02/2013 abdominal ultrasound. FINDINGS: Lower chest: Right lung base atelectasis. Trace left effusion. Mild coronary artery calcific atherosclerosis. Hepatobiliary: No focal liver abnormality is seen. Cholelithiasis. No gallbladder wall thickening. No biliary ductal dilatation. Pancreas: Unremarkable. No pancreatic ductal dilatation or surrounding inflammatory changes. Spleen: Normal in size without focal abnormality. Adrenals/Urinary Tract: Normal adrenal glands. Normal right kidney. No urinary stone  disease identified. The left kidney is enlarged, there is extensive left-sided perinephric stranding, and there is mild left hydronephrosis. No obstructing stone or mass identified. Bladder wall thickening. Stomach/Bowel: Stomach is within normal limits. Appendix appears normal. No evidence of bowel wall thickening, distention, or inflammatory changes. Vascular/Lymphatic: Aortic atherosclerosis. No enlarged abdominal or pelvic lymph nodes. Reproductive: Uterus and bilateral adnexa are unremarkable. Other: No abdominal wall hernia or abnormality. No abdominopelvic ascites. Musculoskeletal: L5-S1 grade 1 anterolisthesis and chronic L5 pars defects. No acute fracture identified. IMPRESSION: 1. Enlarged left kidney, left perinephric stranding, and mild left hydronephrosis. Bladder wall thickening. No obstructing stone or mass identified. Findings probably represent infection of the renal collecting system, less likely recently passed stone. 2. Cholelithiasis. 3. Mild coronary artery and aortic calcific atherosclerosis. Electronically Signed   By: Kristine Garbe M.D.   On: 02/14/2018 20:11   [place] Ekg Site Rite  Result Date: 02/15/2018 If Site Rite image not attached, placement could not be confirmed due to current cardiac rhythm.  US Abdomen Limited Ruq  Result Date: 02/16/2018 CLINICAL DATA:  Elevated LFTs. EXAM: ULTRASOUND ABDOMEN LIMITED RIGHT UPPER QUADRANT COMPARISON:  CT 02/14/2018 FINDINGS: Gallbladder: Distended containing intraluminal sludge and shadowing 1.2 cm gallstone. Mild gallbladder wall thickening of 3.6 mm. No sonographic Murphy sign noted by sonographer. Common bile duct: Diameter: 4 mm, normal. Liver: No focal lesion identified. Within normal limits in parenchymal echogenicity. Portal vein is patent on color Doppler imaging with normal direction of blood flow towards the liver. Right pleural effusion incidentally noted. Trace perihepatic fluid inferiorly. IMPRESSION: 1. Gallbladder  distention containing intraluminal sludge and stone. Borderline mild gallbladder wall thickening. Findings could represent acute cholecystitis in the appropriate clinical setting. Consider nuclear medicine hepatic biliary scan based on clinical concern. 2. No biliary dilatation. No focal hepatic abnormality to explain elevated LFTs. Electronically Signed   By: Keith Rake M.D.   On: 02/16/2018 01:52  ASSESSMENT AND PLAN:   62 year old female patient with with history of hypertension, hyperlipidemia, type 2 diabetes mellitus currently under hospitalist service for abdominal pain -Septic shock Continue IV fluids and IV pressor medication Close input output monitoring Follow-up cultures  -Sepsis secondary to pyelonephritis  IV fluids Follow-up cultures and lactic acid Continue IV meropenem antibiotic  -Severe metabolic acidosis secondary to sepsis  -Impending respiratory failure secondary to sepsis and acidosis BiPAP for oxygen supply Monitor ABG  -Acute kidney injury and renal failure Nephrology follow-up IV fluids Renal ultrasound reviewed  -Acute hyperkalemia resolved  -Cholelithiasis with distention of gallbladder Continue IV antibiotics and follow-up LFTs Doubt surgical intervention considering severe sepsis and septic shock Consideration for percutaneous cholecystostomy tube drainage as needed  -Leukocytosis secondary to sepsis Continue IV antibiotics  -Anemia Consider PRBC transfusion IV  -Atelectasis and pneumonia Continue IV meropenem antibiotic Zosyn and vancomycin  -Hypocalcemia Replace electrolytes  All the records are reviewed and case discussed with Care Management/Social Worker. Management plans discussed with the patient, family and they are in agreement.  CODE STATUS: Full code  DVT Prophylaxis: SCDs  TOTAL TIME TAKING CARE OF THIS PATIENT: 38 minutes.   POSSIBLE D/C IN  3 to 4 DAYS, DEPENDING ON CLINICAL CONDITION.  Saundra Shelling M.D  on 02/16/2018 at 11:47 AM  Between 7am to 6pm - Pager - [phone]  After 6pm go to [email or web address] - password [email or web address] [facility]  Office  [phone]  CC: Primary care physician; Petra Kuba, MD  Note: This dictation was prepared with Dragon dictation along with smaller phrase technology. Any transcriptional errors that result from this process are unintentional.

## 2018-02-16 NOTE — Progress Notes (Signed)
Darel Hong, NP notified of elevated lactic acid @9 .6 and low calcium levels with calcium currently infusing. Albumin ordered. Repeat labs ordered for early AM.

## 2018-02-16 NOTE — Progress Notes (Signed)
Pharmacy Antibiotic Note  Cathy Cline is a 62 y.o. female admitted on 02/14/2018 with sepsis.  Pharmacy has been consulted for meropenem dosing. CT of her abdomen showed perinephric stranding consistent with pyelonephritis. WBC 28.7>> 24.5, lactic trending down 3.9>>2.1, remains afeb. Pt seems to be worsening per team. Escalating therapy from pip/tazo to meropenem, which will add ESBL coverage. Pt does not seem to be on HD.   Plan: Continue meropenem 500 mg q24H. Renally adjusted.   Height: 4\' 11"  (149.9 cm) Weight: 120 lb 13 oz (54.8 kg) IBW/kg (Calculated) : 43.2  Temp (24hrs), Avg:99 F (37.2 C), Min:97.9 F (36.6 C), Max:100.7 F (38.2 C)  Recent Labs  Lab 02/14/18 1844  02/15/18 0419 02/15/18 1451 02/15/18 1456 02/15/18 1720 02/15/18 1734 02/15/18 1735 02/15/18 1911 02/15/18 2304 02/16/18 0353  WBC 28.7*  --  24.5*  --   --  15.9*  --   --   --   --  27.7*  CREATININE 5.30*  --  5.08* 5.25*  --   --   --  5.29*  --  4.96* 4.95*  LATICACIDVEN 3.7*   < > 2.1*  --  5.5*  --  8.8*  --  8.9* 9.6* 6.4*   < > = values in this interval not displayed.    Estimated Creatinine Clearance: 9 mL/min (A) (by C-G formula based on SCr of 4.95 mg/dL (H)).    No Known Allergies  Antimicrobials this admission: 11/26 ceftriaxone >> 11/26 11/26 pip/tazo >> 11/27 11/27 meropenem >>   Dose adjustments this admission: AKI: - CrCl < 10 ml/min - adjusted to meropenem 500 mg q24H  Microbiology results: 11/26 UCx: pending   Thank you for allowing pharmacy to be a part of this patient's care.   Pernell Dupre, PharmD, BCPS Clinical Pharmacist 02/16/2018 9:21 AM

## 2018-02-16 NOTE — Progress Notes (Signed)
Pharmacy Antibiotic Note  Cathy Cline is a 62 y.o. female admitted on 02/14/2018 with sepsis.  Pharmacy has been consulted for meropenem dosing. CT of her abdomen showed perinephric stranding consistent with pyelonephritis. WBC 28.7>> 24.5, lactic trending down 3.9>>2.1, remains afeb. Pt seems to be worsening per team. Escalating therapy from pip/tazo to meropenem, which will add ESBL coverage. Pt does not seem to be on HD.   Plan: Will start meropenem 500 mg q24H. Renally adjusted.    Height: 4\' 11"  (149.9 cm) Weight: 120 lb 13 oz (54.8 kg) IBW/kg (Calculated) : 43.2  Temp (24hrs), Avg:98.9 F (37.2 C), Min:97.8 F (36.6 C), Max:100.7 F (38.2 C)  Recent Labs  Lab 02/14/18 1844  02/15/18 0419 02/15/18 1451 02/15/18 1456 02/15/18 1720 02/15/18 1734 02/15/18 1735 02/15/18 1911 02/15/18 2304  WBC 28.7*  --  24.5*  --   --  15.9*  --   --   --   --   CREATININE 5.30*  --  5.08* 5.25*  --   --   --  5.29*  --  4.96*  LATICACIDVEN 3.7*   < > 2.1*  --  5.5*  --  8.8*  --  8.9* 9.6*   < > = values in this interval not displayed.    Estimated Creatinine Clearance: 9 mL/min (A) (by C-G formula based on SCr of 4.96 mg/dL (H)).    No Known Allergies  Antimicrobials this admission: 11/26 ceftriaxone >> 11/26 11/26 pip/tazo >> 11/27 11/27 meropenem >>   Dose adjustments this admission: AKI: - CrCl < 10 ml/min - adjusted to meropenem 500 mg q24H  Microbiology results: 11/26 UCx: pending    Thank you for allowing pharmacy to be a part of this patient's care.  Vancomycin added 11/27 DW 55kg Vd 39L   kei 0.012 t1/2 58 hours Vancomycin 750 mg q 72 hours ordered with stacked dosing. Level before 3rd dose. Goal trough 15-20.   Eloise Harman, PharmD Clinical Pharmacist 02/16/2018 1:12 AM

## 2018-02-16 NOTE — Consult Note (Signed)
Reason for Consult:acute cholecystitis contributing to sepsis  Referring Physician: Darel Hong, Nurse Practitioner  Cathy Cline Cathy Cline is an 62 y.o. female.  HPI: She was admitted to [facility] with pyelonephritis and AKI.  She was transferred to the intensive care unit last night due to worsening urosepsis.  Due to mild elevation in the alkaline phosphatase, the nurse practitioner on overnight obtained a right upper quadrant ultrasound.  The results of this were interpreted as potentially representing acute cholecystitis (early) in the appropriate clinical setting.  General surgery was called early this morning to consult for this entity.  At this time the patient is in the intensive care unit with a lactic acidosis in addition to her acute kidney injury.  She is on BiPAP and currently not intubated.  I do not see that she is requiring any vasoactive infusions at this time.  She is on broad-spectrum antibiotics for urosepsis.  Culture data are pending.  She speaks [place].  On questioning, she states that she has diffuse abdominal pain and cannot pinpoint a specific area of increased pain.  She is tender in the right upper quadrant, right lower quadrant, left upper quadrant, and left lower quadrant.  She is asking when she can have something to eat.  Past Medical History:  Diagnosis Date  . Carotid stenosis   . Diabetes mellitus without complication ([place])   . Hypercholesterolemia   . Hypertension   . Myocardial infarction ([place])   . Tachycardia     Past Surgical History:  Procedure Laterality Date  . CARDIAC CATHETERIZATION    . COLONOSCOPY WITH PROPOFOL N/A 10/02/2015   Procedure: COLONOSCOPY WITH PROPOFOL;  Surgeon: Manya Silvas, MD;  Location: [facility] ENDOSCOPY;  Service: Endoscopy;  Laterality: N/A;  . KNEE SURGERY    . TUBAL LIGATION      No family history on file.  Social History:  reports that she has never smoked. She has never used smokeless tobacco. She reports that she does  not drink alcohol or use drugs.  Allergies: No Known Allergies  Medications: I have reviewed the patient's current medications.  Results for orders placed or performed during the hospital encounter of 02/14/18 (from the past 48 hour(s))  Urinalysis, Complete w Microscopic     Status: Abnormal   Collection Time: 02/14/18  6:44 PM  Result Value Ref Range   Color, Urine YELLOW (A) YELLOW   APPearance CLOUDY (A) CLEAR   Specific Gravity, Urine 1.011 1.005 - 1.030   pH 5.0 5.0 - 8.0   Glucose, UA NEGATIVE NEGATIVE mg/dL   Hgb urine dipstick LARGE (A) NEGATIVE   Bilirubin Urine NEGATIVE NEGATIVE   Ketones, ur NEGATIVE NEGATIVE mg/dL   Protein, ur 100 (A) NEGATIVE mg/dL   Nitrite NEGATIVE NEGATIVE   Leukocytes, UA LARGE (A) NEGATIVE   RBC / HPF >50 (H) 0 - 5 RBC/hpf   WBC, UA >50 (H) 0 - 5 WBC/hpf   Bacteria, UA MANY (A) NONE SEEN   Squamous Epithelial / LPF 0-5 0 - 5   WBC Clumps PRESENT    Mucus PRESENT     Comment: Performed at [facility], [address] [place], [place] [address]  Basic metabolic panel     Status: Abnormal   Collection Time: 02/14/18  6:44 PM  Result Value Ref Range   Sodium 130 (L) 135 - 145 mmol/L   Potassium 5.1 3.5 - 5.1 mmol/L   Chloride 95 (L) 98 - 111 mmol/L   CO2 19 (L) 22 -  32 mmol/L   Glucose, Bld 148 (H) 70 - 99 mg/dL   BUN 78 (H) 8 - 23 mg/dL   Creatinine, Ser 5.30 (H) 0.44 - 1.00 mg/dL   Calcium 8.1 (L) 8.9 - 10.3 mg/dL   GFR calc non Af Amer 8 (L) >60 mL/min   GFR calc Af Amer 9 (L) >60 mL/min   Anion gap 16 (H) 5 - 15    Comment: Performed at [facility], [address] [place], [place] [address]  CBC     Status: Abnormal   Collection Time: 02/14/18  6:44 PM  Result Value Ref Range   WBC 28.7 (H) 4.0 - 10.5 K/uL    Comment: WHITE COUNT CONFIRMED ON SMEAR   RBC 3.07 (L) 3.87 - 5.11 MIL/uL   Hemoglobin 8.9 (L) 12.0 - 15.0 g/dL   HCT 27.1 (L) 36.0 - 46.0 %   MCV 88.3 80.0 - 100.0 fL   MCH 29.0 26.0 - 34.0 pg    MCHC 32.8 30.0 - 36.0 g/dL   RDW 14.5 11.5 - 15.5 %   Platelets 314 150 - 400 K/uL   nRBC 0.0 0.0 - 0.2 %    Comment: Performed at [facility], [address] [place], [place] [address]  Lactic acid, plasma     Status: Abnormal   Collection Time: 02/14/18  6:44 PM  Result Value Ref Range   Lactic Acid, Venous 3.7 (HH) 0.5 - 1.9 mmol/L    Comment: CRITICAL RESULT CALLED TO, READ BACK BY AND VERIFIED WITH STEPHEN JONES @1949  02/14/18 AKT Performed at [facility], [address] [place], [place] [address]   TSH     Status: None   Collection Time: 02/14/18  6:44 PM  Result Value Ref Range   TSH 2.472 0.350 - 4.500 uIU/mL    Comment: Performed by a 3rd Generation assay with a functional sensitivity of <=0.01 uIU/mL. Performed at [facility], [address] [place], [place] [address]   Hemoglobin A1c     Status: Abnormal   Collection Time: 02/14/18  6:44 PM  Result Value Ref Range   Hgb A1c MFr Bld 9.4 (H) 4.8 - 5.6 %    Comment: (NOTE) Pre diabetes:          5.7%-6.4% Diabetes:              >6.4% Glycemic control for   <7.0% adults with diabetes    Mean Plasma Glucose 223.08 mg/dL    Comment: Performed at [facility] [address], [place], [place] [address]  Lactic acid, plasma     Status: Abnormal   Collection Time: 02/14/18  9:46 PM  Result Value Ref Range   Lactic Acid, Venous 3.9 (HH) 0.5 - 1.9 mmol/L    Comment: CRITICAL RESULT CALLED TO, READ BACK BY AND VERIFIED WITH RN TERRY @2219  02/14/18 AKT Performed at [facility], [address] [place], [place] [address]   Glucose, capillary     Status: None   Collection Time: 02/14/18 10:07 PM  Result Value Ref Range   Glucose-Capillary 90 70 - 99 mg/dL   Comment 1 Notify RN   Lactic acid, plasma     Status: Abnormal   Collection Time: 02/15/18  4:19 AM  Result Value Ref Range   Lactic Acid, Venous 2.1 (HH) 0.5 - 1.9 mmol/L    Comment: CRITICAL RESULT CALLED TO, READ  BACK BY AND VERIFIED WITH VIVIAN ALI @0459  02/15/18 FLC Performed at  [facility], [address] [place], [place] [address]   CBC     Status: Abnormal   Collection Time: 02/15/18  4:19 AM  Result Value Ref Range   WBC 24.5 (H) 4.0 - 10.5 K/uL   RBC 2.46 (L) 3.87 - 5.11 MIL/uL   Hemoglobin 7.1 (L) 12.0 - 15.0 g/dL   HCT 22.9 (L) 36.0 - 46.0 %   MCV 93.1 80.0 - 100.0 fL   MCH 28.9 26.0 - 34.0 pg   MCHC 31.0 30.0 - 36.0 g/dL   RDW 14.7 11.5 - 15.5 %   Platelets 313 150 - 400 K/uL   nRBC 0.0 0.0 - 0.2 %    Comment: Performed at [facility], [address] [place], [place] [address]  Basic metabolic panel     Status: Abnormal   Collection Time: 02/15/18  4:19 AM  Result Value Ref Range   Sodium 129 (L) 135 - 145 mmol/L   Potassium 6.1 (H) 3.5 - 5.1 mmol/L   Chloride 101 98 - 111 mmol/L   CO2 14 (L) 22 - 32 mmol/L   Glucose, Bld 99 70 - 99 mg/dL   BUN 79 (H) 8 - 23 mg/dL   Creatinine, Ser 5.08 (H) 0.44 - 1.00 mg/dL   Calcium 6.9 (L) 8.9 - 10.3 mg/dL   GFR calc non Af Amer 9 (L) >60 mL/min   GFR calc Af Amer 10 (L) >60 mL/min   Anion gap 14 5 - 15    Comment: Performed at [facility], [address] [place], [place] [address]  Glucose, capillary     Status: Abnormal   Collection Time: 02/15/18  7:39 AM  Result Value Ref Range   Glucose-Capillary 111 (H) 70 - 99 mg/dL  Glucose, capillary     Status: Abnormal   Collection Time: 02/15/18 11:47 AM  Result Value Ref Range   Glucose-Capillary 193 (H) 70 - 99 mg/dL  Glucose, capillary     Status: Abnormal   Collection Time: 02/15/18  1:36 PM  Result Value Ref Range   Glucose-Capillary 184 (H) 70 - 99 mg/dL   Comment 1 Notify RN   Basic metabolic panel     Status: Abnormal   Collection Time: 02/15/18  2:51 PM  Result Value Ref Range   Sodium 130 (L) 135 - 145 mmol/L   Potassium 5.8 (H) 3.5 - 5.1 mmol/L   Chloride 103 98 - 111 mmol/L   CO2 9 (L) 22 - 32 mmol/L   Glucose, Bld 131 (H) 70 - 99  mg/dL   BUN 81 (H) 8 - 23 mg/dL   Creatinine, Ser 5.25 (H) 0.44 - 1.00 mg/dL   Calcium 6.9 (L) 8.9 - 10.3 mg/dL   GFR calc non Af Amer 8 (L) >60 mL/min   GFR calc Af Amer 9 (L) >60 mL/min   Anion gap 18 (H) 5 - 15    Comment: Performed at [facility], [address] [place], [place] [address]  Lactic acid, plasma     Status: Abnormal   Collection Time: 02/15/18  2:56 PM  Result Value Ref Range   Lactic Acid, Venous 5.5 (HH) 0.5 - 1.9 mmol/L    Comment: CRITICAL RESULT CALLED TO, READ BACK BY AND VERIFIED WITH JORGE MORALES AT 0272 ON 02/15/18 [place]. Performed at [facility], [address], [place],  [address]   Blood gas, arterial     Status: Abnormal   Collection Time: 02/15/18  3:04 PM  Result Value Ref Range   FIO2 0.21    pH, Arterial 7.33 (L) 7.350 - 7.450   pCO2 arterial 19 (LL) 32.0 - 48.0 mmHg    Comment: CRITICAL RESULT CALLED TO, READ BACK BY AND VERIFIED WITH: NOTIFIED JORGE, RN AT 1512 ON 02/15/2018 BY GKM    pO2, Arterial 60 (L) 83.0 - 108.0 mmHg   Bicarbonate 10.0 (L) 20.0 - 28.0 mmol/L   Acid-base deficit 13.5 (H) 0.0 - 2.0 mmol/L   O2 Saturation 88.5 %   Patient temperature 37.0    Collection site RIGHT BRACHIAL    Sample type ARTERIAL DRAW    Allens test (pass/fail) PASS PASS    Comment: Performed at [facility], [address] [place], [place] [address]  Glucose, capillary     Status: Abnormal   Collection Time: 02/15/18  4:42 PM  Result Value Ref Range   Glucose-Capillary 101 (H) 70 - 99 mg/dL   Comment 1 Notify RN   MRSA PCR Screening     Status: None   Collection Time: 02/15/18  5:13 PM  Result Value Ref Range   MRSA by PCR NEGATIVE NEGATIVE    Comment:        The GeneXpert MRSA Assay (FDA approved for NASAL specimens only), is one component of a comprehensive MRSA colonization surveillance program. It is not intended to diagnose MRSA infection nor to guide or monitor treatment for MRSA  infections. Performed at [facility], [address] [place], [place] [address]   Potassium     Status: None   Collection Time: 02/15/18  5:20 PM  Result Value Ref Range   Potassium 4.4 3.5 - 5.1 mmol/L    Comment: Performed at [facility], [address] [place], [place] [address]  CBC with Differential/Platelet     Status: Abnormal   Collection Time: 02/15/18  5:20 PM  Result Value Ref Range   WBC 15.9 (H) 4.0 - 10.5 K/uL   RBC 2.82 (L) 3.87 - 5.11 MIL/uL   Hemoglobin 8.2 (L) 12.0 - 15.0 g/dL   HCT 27.5 (L) 36.0 - 46.0 %   MCV 97.5 80.0 - 100.0 fL   MCH 29.1 26.0 - 34.0 pg   MCHC 29.8 (L) 30.0 - 36.0 g/dL   RDW 14.9 11.5 - 15.5 %   Platelets 258 150 - 400 K/uL   nRBC 0.3 (H) 0.0 - 0.2 %   Neutrophils Relative % 90 %   Neutro Abs 14.8 (H) 1.7 - 7.7 K/uL   Band Neutrophils 3 %   Lymphocytes Relative 2 %   Lymphs Abs 0.3 (L) 0.7 - 4.0 K/uL   Monocytes Relative 4 %   Monocytes Absolute 0.6 0.1 - 1.0 K/uL   Eosinophils Relative 0 %   Eosinophils Absolute 0.0 0.0 - 0.5 K/uL   Basophils Relative 1 %   Basophils Absolute 0.2 (H) 0.0 - 0.1 K/uL   WBC Morphology VACUOLATED NEUTROPHILS    RBC Morphology MORPHOLOGY UNREMARKABLE    Smear Review Normal platelet morphology    Abs Immature Granulocytes 0.00 0.00 - 0.07 K/uL    Comment: Performed at [facility], [address] [place], [place] [address]  Lactic acid, plasma     Status: Abnormal   Collection Time: 02/15/18  5:34 PM  Result Value Ref Range   Lactic Acid, Venous 8.8 (HH) 0.5 - 1.9 mmol/L    Comment: CRITICAL RESULT CALLED TO, READ BACK BY AND VERIFIED WITH TIFFANY FAIRING @1809  02/15/18 AKT  Performed at [facility], [address] [place], [place] [address]   Basic metabolic panel     Status: Abnormal   Collection Time: 02/15/18  5:35 PM  Result Value Ref Range   Sodium 135 135 - 145 mmol/L    Comment: LYTES REPEATED   Potassium 4.5 3.5 - 5.1 mmol/L   Chloride 104 98 -  111 mmol/L   CO2 8 (L) 22 - 32 mmol/L   Glucose, Bld 102 (H) 70 - 99 mg/dL   BUN 86 (H) 8 - 23 mg/dL   Creatinine, Ser 5.29 (H) 0.44 - 1.00 mg/dL   Calcium 7.1 (L) 8.9 - 10.3 mg/dL   GFR calc non Af Amer 8 (L) >60 mL/min   GFR calc Af Amer 9 (L) >60 mL/min   Anion gap 23 (H) 5 - 15    Comment: Performed at [facility], [address] [place], [place] [address]  Procalcitonin - Baseline     Status: None   Collection Time: 02/15/18  5:35 PM  Result Value Ref Range   Procalcitonin >150.00 ng/mL    Comment:        Interpretation: PCT >= 10 ng/mL: Important systemic inflammatory response, almost exclusively due to severe bacterial sepsis or septic shock. (NOTE)       Sepsis PCT Algorithm           Lower Respiratory Tract                                      Infection PCT Algorithm    ----------------------------     ----------------------------         PCT < 0.25 ng/mL                PCT < 0.10 ng/mL         Strongly encourage             Strongly discourage   discontinuation of antibiotics    initiation of antibiotics    ----------------------------     -----------------------------       PCT 0.25 - 0.50 ng/mL            PCT 0.10 - 0.25 ng/mL               OR       >80% decrease in PCT            Discourage initiation of                                            antibiotics      Encourage discontinuation           of antibiotics    ----------------------------     -----------------------------         PCT >= 0.50 ng/mL              PCT 0.26 - 0.50 ng/mL                AND       <80% decrease in PCT             Encourage initiation of  antibiotics       Encourage continuation           of antibiotics    ----------------------------     -----------------------------        PCT >= 0.50 ng/mL                  PCT > 0.50 ng/mL               AND         increase in PCT                  Strongly encourage                                       initiation of antibiotics    Strongly encourage escalation           of antibiotics                                     -----------------------------                                           PCT <= 0.25 ng/mL                                                 OR                                        > 80% decrease in PCT                                     Discontinue / Do not initiate                                             antibiotics Performed at [facility], [address] [place], [place] [address]   Blood gas, arterial     Status: Abnormal   Collection Time: 02/15/18  6:12 PM  Result Value Ref Range   FIO2 28.00    Delivery systems NASAL CANNULA    pH, Arterial 7.26 (L) 7.350 - 7.450   pCO2 arterial <19.0 (LL) 32.0 - 48.0 mmHg    Comment: CRITICAL RESULT CALLED TO, READ BACK BY AND VERIFIED WITH: DANA NP [id number] JGT    pO2, Arterial 69 (L) 83.0 - 108.0 mmHg   Bicarbonate 6.7 (L) 20.0 - 28.0 mmol/L   Acid-base deficit 17.7 (H) 0.0 - 2.0 mmol/L   O2 Saturation 90.4 %   Patient temperature 37.0    Collection site RIGHT BRACHIAL    Sample type ARTERIAL DRAW    Allens test (pass/fail) PASS PASS    Comment: Performed at [facility], [address] [place], [place] [address]  CULTURE, BLOOD (ROUTINE X 2) w  Reflex to ID Panel     Status: None (Preliminary result)   Collection Time: 02/15/18  7:10 PM  Result Value Ref Range   Specimen Description BLOOD LEFT ANTECUBITAL    Special Requests      BOTTLES DRAWN AEROBIC AND ANAEROBIC Blood Culture adequate volume   Culture      NO GROWTH < 12 HOURS Performed at [facility], [address] [place], [place] [address]    Report Status PENDING   Lactic acid, plasma     Status: Abnormal   Collection Time: 02/15/18  7:11 PM  Result Value Ref Range   Lactic Acid, Venous 8.9 (HH) 0.5 - 1.9 mmol/L    Comment: CRITICAL RESULT CALLED TO, READ BACK BY AND VERIFIED WITH MEGHAN FLACK  @2008  02/15/18 FLC Performed at [facility], [address] [place], [place] [address]   CULTURE, BLOOD (ROUTINE X 2) w Reflex to ID Panel     Status: None (Preliminary result)   Collection Time: 02/15/18  7:25 PM  Result Value Ref Range   Specimen Description BLOOD BLOOD RIGHT HAND    Special Requests      BOTTLES DRAWN AEROBIC AND ANAEROBIC Blood Culture adequate volume   Culture      NO GROWTH < 12 HOURS Performed at [facility], [address] [place], [place] [address]    Report Status PENDING   Urinalysis, Complete w Microscopic     Status: Abnormal   Collection Time: 02/15/18  8:10 PM  Result Value Ref Range   Color, Urine YELLOW (A) YELLOW   APPearance CLOUDY (A) CLEAR   Specific Gravity, Urine 1.011 1.005 - 1.030   pH 5.0 5.0 - 8.0   Glucose, UA NEGATIVE NEGATIVE mg/dL   Hgb urine dipstick LARGE (A) NEGATIVE   Bilirubin Urine NEGATIVE NEGATIVE   Ketones, ur NEGATIVE NEGATIVE mg/dL   Protein, ur 100 (A) NEGATIVE mg/dL   Nitrite NEGATIVE NEGATIVE   Leukocytes, UA MODERATE (A) NEGATIVE   RBC / HPF >50 (H) 0 - 5 RBC/hpf   WBC, UA >50 (H) 0 - 5 WBC/hpf   Bacteria, UA RARE (A) NONE SEEN   Squamous Epithelial / LPF 0-5 0 - 5    Comment: Performed at [facility], [address] [place], [place] [address]  Glucose, capillary     Status: Abnormal   Collection Time: 02/15/18 10:46 PM  Result Value Ref Range   Glucose-Capillary 61 (L) 70 - 99 mg/dL   Comment 1 Notify RN    Comment 2 Document in Chart   Basic metabolic panel     Status: Abnormal   Collection Time: 02/15/18 11:04 PM  Result Value Ref Range   Sodium 138 135 - 145 mmol/L    Comment: LYTES REPEATED / FLC   Potassium 3.6 3.5 - 5.1 mmol/L   Chloride 102 98 - 111 mmol/L   CO2 12 (L) 22 - 32 mmol/L   Glucose, Bld 155 (H) 70 - 99 mg/dL   BUN 75 (H) 8 - 23 mg/dL   Creatinine, Ser 4.96 (H) 0.44 - 1.00 mg/dL   Calcium 5.9 (LL) 8.9 - 10.3 mg/dL    Comment: CRITICAL RESULT CALLED  TO, READ BACK BY AND VERIFIED WITH MEGHAN FLACK @2335  02/15/18 FLC    GFR calc non Af Amer 9 (L) >60 mL/min   GFR calc Af Amer 10 (L) >60 mL/min   Anion gap 25 (H) 5 - 15    Comment: Performed at  [facility], [address], [place], [place] [address]  Magnesium     Status: Abnormal   Collection Time: 02/15/18 11:04 PM  Result Value Ref Range   Magnesium 1.1 (L) 1.7 - 2.4 mg/dL    Comment: Performed at [facility], [address] [place], [place] [address]  Phosphorus     Status: Abnormal   Collection Time: 02/15/18 11:04 PM  Result Value Ref Range   Phosphorus 5.3 (H) 2.5 - 4.6 mg/dL    Comment: Performed at [facility], [address] [place], [place] [address]  Protime-INR     Status: Abnormal   Collection Time: 02/15/18 11:04 PM  Result Value Ref Range   Prothrombin Time 16.6 (H) 11.4 - 15.2 seconds   INR 1.36     Comment: Performed at [facility], [address] [place], [place] [address]  APTT     Status: Abnormal   Collection Time: 02/15/18 11:04 PM  Result Value Ref Range   aPTT 41 (H) 24 - 36 seconds    Comment:        IF BASELINE aPTT IS ELEVATED, SUGGEST PATIENT RISK ASSESSMENT BE USED TO DETERMINE APPROPRIATE ANTICOAGULANT THERAPY. Performed at [facility], [address] [place], [place] [address]   Procalcitonin - Baseline     Status: None   Collection Time: 02/15/18 11:04 PM  Result Value Ref Range   Procalcitonin >150.00 ng/mL    Comment:        Interpretation: PCT >= 10 ng/mL: Important systemic inflammatory response, almost exclusively due to severe bacterial sepsis or septic shock. (NOTE)       Sepsis PCT Algorithm           Lower Respiratory Tract                                      Infection PCT Algorithm    ----------------------------     ----------------------------         PCT < 0.25 ng/mL                PCT < 0.10 ng/mL         Strongly encourage             Strongly discourage    discontinuation of antibiotics    initiation of antibiotics    ----------------------------     -----------------------------       PCT 0.25 - 0.50 ng/mL            PCT 0.10 - 0.25 ng/mL               OR       >80% decrease in PCT            Discourage initiation of                                            antibiotics      Encourage discontinuation           of antibiotics    ----------------------------     -----------------------------         PCT >= 0.50 ng/mL              PCT 0.26 - 0.50 ng/mL  AND       <80% decrease in PCT             Encourage initiation of                                             antibiotics       Encourage continuation           of antibiotics    ----------------------------     -----------------------------        PCT >= 0.50 ng/mL                  PCT > 0.50 ng/mL               AND         increase in PCT                  Strongly encourage                                      initiation of antibiotics    Strongly encourage escalation           of antibiotics                                     -----------------------------                                           PCT <= 0.25 ng/mL                                                 OR                                        > 80% decrease in PCT                                     Discontinue / Do not initiate                                             antibiotics Performed at [facility], [address] [place], [place] [address]   Hepatic function panel     Status: Abnormal   Collection Time: 02/15/18 11:04 PM  Result Value Ref Range   Total Protein 4.3 (L) 6.5 - 8.1 g/dL   Albumin 1.6 (L) 3.5 - 5.0 g/dL   AST 54 (H) 15 - 41 U/L   ALT 23 0 - 44 U/L   Alkaline Phosphatase 318 (H) 38 - 126 U/L   Total Bilirubin 0.4 0.3 - 1.2 mg/dL   Bilirubin, Direct <0.1 0.0 - 0.2 mg/dL   Indirect Bilirubin  NOT CALCULATED 0.3 - 0.9 mg/dL    Comment: Performed at [facility], [address] [place], [place] [address]  Lactic acid, plasma     Status: Abnormal   Collection Time: 02/15/18 11:04 PM  Result Value Ref Range   Lactic Acid, Venous 9.6 (HH) 0.5 - 1.9 mmol/L    Comment: CRITICAL RESULT CALLED TO, READ BACK BY AND VERIFIED WITH MEGHAN FLACK @2335  02/15/18 FLC Performed at [facility], [address] [place], [place] [address]   Glucose, capillary     Status: Abnormal   Collection Time: 02/15/18 11:27 PM  Result Value Ref Range   Glucose-Capillary 103 (H) 70 - 99 mg/dL   Comment 1 Notify RN    Comment 2 Document in Chart   Blood gas, arterial     Status: Abnormal   Collection Time: 02/16/18 12:27 AM  Result Value Ref Range   FIO2 0.40    Delivery systems BILEVEL POSITIVE AIRWAY PRESSURE    Inspiratory PAP 12    Expiratory PAP 5    pH, Arterial 7.34 (L) 7.350 - 7.450   pCO2 arterial 19 (LL) 32.0 - 48.0 mmHg    Comment: CRITICAL RESULT, NOTIFIED PHYSICIAN NP J. KEENE AT 0040 ON 02/16/18 K[place]    pO2, Arterial 107 83.0 - 108.0 mmHg   Bicarbonate 10.3 (L) 20.0 - 28.0 mmol/L   Acid-base deficit 13.1 (H) 0.0 - 2.0 mmol/L   O2 Saturation 97.8 %   Patient temperature 37.0    Collection site RIGHT RADIAL    Sample type ARTERIAL DRAW    Allens test (pass/fail) PASS PASS   Mechanical Rate 10     Comment: Performed at [facility], [address] [place], [place] [address]  Glucose, capillary     Status: Abnormal   Collection Time: 02/16/18  3:44 AM  Result Value Ref Range   Glucose-Capillary 128 (H) 70 - 99 mg/dL   Comment 1 Notify RN    Comment 2 Document in Chart   CBC     Status: Abnormal   Collection Time: 02/16/18  3:53 AM  Result Value Ref Range   WBC 27.7 (H) 4.0 - 10.5 K/uL   RBC 2.24 (L) 3.87 - 5.11 MIL/uL   Hemoglobin 6.3 (L) 12.0 - 15.0 g/dL   HCT 20.1 (L) 36.0 - 46.0 %   MCV 89.7 80.0 - 100.0 fL   MCH 28.1 26.0 - 34.0 pg   MCHC 31.3 30.0 - 36.0 g/dL   RDW 15.1 11.5 - 15.5 %   Platelets 197 150 - 400  K/uL   nRBC 0.0 0.0 - 0.2 %    Comment: Performed at [facility], [address] [place], [place] [address]  Basic metabolic panel     Status: Abnormal   Collection Time: 02/16/18  3:53 AM  Result Value Ref Range   Sodium 138 135 - 145 mmol/L    Comment: LYTES REPEATED / FLC   Potassium 3.5 3.5 - 5.1 mmol/L   Chloride 98 98 - 111 mmol/L   CO2 15 (L) 22 - 32 mmol/L   Glucose, Bld 140 (H) 70 - 99 mg/dL   BUN 73 (H) 8 - 23 mg/dL   Creatinine, Ser 4.95 (H) 0.44 - 1.00 mg/dL   Calcium 6.1 (LL) 8.9 - 10.3 mg/dL    Comment: CRITICAL RESULT CALLED TO, READ BACK BY AND VERIFIED WITH MEGHAN FLACK @0437  02/16/18 FLC    GFR calc non Af Amer 9 (L) >60  mL/min   GFR calc Af Amer 10 (L) >60 mL/min   Anion gap 25 (H) 5 - 15    Comment: Performed at [facility], [address] [place], [place] [address]  Procalcitonin     Status: None   Collection Time: 02/16/18  3:53 AM  Result Value Ref Range   Procalcitonin >150.00 ng/mL    Comment:        Interpretation: PCT >= 10 ng/mL: Important systemic inflammatory response, almost exclusively due to severe bacterial sepsis or septic shock. (NOTE)       Sepsis PCT Algorithm           Lower Respiratory Tract                                      Infection PCT Algorithm    ----------------------------     ----------------------------         PCT < 0.25 ng/mL                PCT < 0.10 ng/mL         Strongly encourage             Strongly discourage   discontinuation of antibiotics    initiation of antibiotics    ----------------------------     -----------------------------       PCT 0.25 - 0.50 ng/mL            PCT 0.10 - 0.25 ng/mL               OR       >80% decrease in PCT            Discourage initiation of                                            antibiotics      Encourage discontinuation           of antibiotics    ----------------------------     -----------------------------         PCT >= 0.50 ng/mL               PCT 0.26 - 0.50 ng/mL                AND       <80% decrease in PCT             Encourage initiation of                                             antibiotics       Encourage continuation           of antibiotics    ----------------------------     -----------------------------        PCT >= 0.50 ng/mL                  PCT > 0.50 ng/mL               AND         increase in PCT                  Strongly encourage  initiation of antibiotics    Strongly encourage escalation           of antibiotics                                     -----------------------------                                           PCT <= 0.25 ng/mL                                                 OR                                        > 80% decrease in PCT                                     Discontinue / Do not initiate                                             antibiotics Performed at [facility], [address] [place], [place] [address]   Lactic acid, plasma     Status: Abnormal   Collection Time: 02/16/18  3:53 AM  Result Value Ref Range   Lactic Acid, Venous 6.4 (HH) 0.5 - 1.9 mmol/L    Comment: CRITICAL RESULT CALLED TO, READ BACK BY AND VERIFIED WITH MEGHAN FLACK @0446  02/16/18 [facility] Performed at [facility], [address] [place], [place] [address]   Magnesium     Status: None   Collection Time: 02/16/18  3:53 AM  Result Value Ref Range   Magnesium 1.7 1.7 - 2.4 mg/dL    Comment: Performed at [facility], [address] [place], [place] [address]  ABO/Rh     Status: None   Collection Time: 02/16/18  3:53 AM  Result Value Ref Range   ABO/RH(D)      A POS Performed at [facility], [address] [place], [place] [address]   C difficile quick scan w PCR reflex     Status: None   Collection Time: 02/16/18  5:20 AM  Result Value Ref Range   C Diff antigen NEGATIVE NEGATIVE   C Diff toxin NEGATIVE NEGATIVE    C Diff interpretation No C. difficile detected.     Comment: Performed at [facility], [address] [place], [place] [address]  Prepare RBC     Status: None   Collection Time: 02/16/18  5:32 AM  Result Value Ref Range   Order Confirmation      ORDER PROCESSED BY BLOOD BANK Performed at [facility], [address] [place],  [address]   Blood gas, arterial     Status: Abnormal   Collection Time: 02/16/18  5:33 AM  Result Value Ref Range   FIO2 0.40    Delivery systems  BILEVEL POSITIVE AIRWAY PRESSURE    Inspiratory PAP 12    Expiratory PAP 5    pH, Arterial 7.37 7.350 - 7.450   pCO2 arterial 21 (L) 32.0 - 48.0 mmHg   pO2, Arterial 91 83.0 - 108.0 mmHg   Bicarbonate 12.1 (L) 20.0 - 28.0 mmol/L   Acid-base deficit 10.9 (H) 0.0 - 2.0 mmol/L   O2 Saturation 96.8 %   Patient temperature 37.0    Collection site RIGHT RADIAL    Sample type ARTERIAL DRAW    Allens test (pass/fail) PASS PASS   Mechanical Rate 10     Comment: Performed at [facility], [address] [place], [place] [address]  Type and screen [place]     Status: None (Preliminary result)   Collection Time: 02/16/18  5:41 AM  Result Value Ref Range   ABO/RH(D) A POS    Antibody Screen NEG    Sample Expiration      02/19/2018 Performed at [facility], [address] [place], [place] [address]    Unit Number [id number]    Blood Component Type RED CELLS,LR    Unit division 00    Status of Unit ALLOCATED    Transfusion Status OK TO TRANSFUSE    Crossmatch Result Compatible    Unit Number [id number]    Blood Component Type RED CELLS,LR    Unit division 00    Status of Unit ALLOCATED    Transfusion Status OK TO TRANSFUSE    Crossmatch Result Compatible   Glucose, capillary     Status: Abnormal   Collection Time: 02/16/18  7:45 AM  Result Value Ref Range   Glucose-Capillary 116 (H) 70 - 99 mg/dL    Dg Chest 1 View  Result  Date: 02/15/2018 CLINICAL DATA:  62 y/o  F; encounter for central line placement. EXAM: CHEST  1 VIEW COMPARISON:  02/15/2018 chest radiograph. FINDINGS: Stable cardiomegaly given projection and technique. Aortic calcific atherosclerosis. Left central venous catheter tip projects over the upper SVC. No pneumothorax. No pleural effusion, consolidation, or pneumothorax. Pulmonary venous hypertension. No acute osseous abnormality is evident. IMPRESSION: 1. Left central venous catheter tip projects over the upper SVC. 2. Cardiomegaly and pulmonary venous hypertension. Electronically Signed   By: Kristine Garbe M.D.   On: 02/15/2018 22:41   US Renal  Result Date: 02/15/2018 CLINICAL DATA:  Renal failure. EXAM: RENAL / URINARY TRACT ULTRASOUND COMPLETE COMPARISON:  CT abdomen and pelvis 02/14/2018 FINDINGS: Right Kidney: Renal measurements: 8.7 x 4.3 x 5.0 cm = volume: 98 mL . Echogenicity within normal limits. No mass or hydronephrosis visualized. Left Kidney: Renal measurements: 13.2 x 6.6 x 6.0 cm = volume: 272 mL. Mild hydronephrosis without mass or perinephric fluid collection identified. Bladder: Collapsed bladder, poorly evaluated. IMPRESSION: Persistent asymmetric enlargement of the left kidney with mild left hydronephrosis, similar to yesterday's CT. Electronically Signed   By: Logan Bores M.D.   On: 02/15/2018 14:32   Dg Chest Port 1 View  Result Date: 02/16/2018 CLINICAL DATA:  Follow-up pneumonia EXAM: PORTABLE CHEST 1 VIEW COMPARISON:  02/15/2018 FINDINGS: Cardiac shadow remains enlarged. Left jugular central line is again seen in the mid superior vena cava. No pneumothorax is noted. Increasing pulmonary vascular congestion with interstitial edema is seen. No focal confluent infiltrate or effusion is noted. IMPRESSION: Increasing changes of CHF. Electronically Signed   By: Inez Catalina M.D.   On: 02/16/2018 07:44   Dg Chest Port 1 View  Result Date:  02/15/2018 CLINICAL DATA:  Acute  respiratory failure EXAM: PORTABLE CHEST 1 VIEW COMPARISON:  10/01/2013, CT chest 09/22/2015 FINDINGS: Cardiomegaly. Bibasilar atelectasis. No pleural effusion or pneumothorax. IMPRESSION: Cardiomegaly with bibasilar atelectasis Electronically Signed   By: Donavan Foil M.D.   On: 02/15/2018 17:46   Ct Renal Stone Study  Result Date: 02/14/2018 CLINICAL DATA:  62 y/o F; leukocytosis and elevated creatinine. Left flank pain. Nausea and vomiting. EXAM: CT ABDOMEN AND PELVIS WITHOUT CONTRAST TECHNIQUE: Multidetector CT imaging of the abdomen and pelvis was performed following the standard protocol without IV contrast. COMPARISON:  10/02/2013 abdominal ultrasound. FINDINGS: Lower chest: Right lung base atelectasis. Trace left effusion. Mild coronary artery calcific atherosclerosis. Hepatobiliary: No focal liver abnormality is seen. Cholelithiasis. No gallbladder wall thickening. No biliary ductal dilatation. Pancreas: Unremarkable. No pancreatic ductal dilatation or surrounding inflammatory changes. Spleen: Normal in size without focal abnormality. Adrenals/Urinary Tract: Normal adrenal glands. Normal right kidney. No urinary stone disease identified. The left kidney is enlarged, there is extensive left-sided perinephric stranding, and there is mild left hydronephrosis. No obstructing stone or mass identified. Bladder wall thickening. Stomach/Bowel: Stomach is within normal limits. Appendix appears normal. No evidence of bowel wall thickening, distention, or inflammatory changes. Vascular/Lymphatic: Aortic atherosclerosis. No enlarged abdominal or pelvic lymph nodes. Reproductive: Uterus and bilateral adnexa are unremarkable. Other: No abdominal wall hernia or abnormality. No abdominopelvic ascites. Musculoskeletal: L5-S1 grade 1 anterolisthesis and chronic L5 pars defects. No acute fracture identified. IMPRESSION: 1. Enlarged left kidney, left perinephric stranding, and mild left hydronephrosis. Bladder wall  thickening. No obstructing stone or mass identified. Findings probably represent infection of the renal collecting system, less likely recently passed stone. 2. Cholelithiasis. 3. Mild coronary artery and aortic calcific atherosclerosis. Electronically Signed   By: Kristine Garbe M.D.   On: 02/14/2018 20:11   [place] Ekg Site Rite  Result Date: 02/15/2018 If Site Rite image not attached, placement could not be confirmed due to current cardiac rhythm.  US Abdomen Limited Ruq  Result Date: 02/16/2018 CLINICAL DATA:  Elevated LFTs. EXAM: ULTRASOUND ABDOMEN LIMITED RIGHT UPPER QUADRANT COMPARISON:  CT 02/14/2018 FINDINGS: Gallbladder: Distended containing intraluminal sludge and shadowing 1.2 cm gallstone. Mild gallbladder wall thickening of 3.6 mm. No sonographic Murphy sign noted by sonographer. Common bile duct: Diameter: 4 mm, normal. Liver: No focal lesion identified. Within normal limits in parenchymal echogenicity. Portal vein is patent on color Doppler imaging with normal direction of blood flow towards the liver. Right pleural effusion incidentally noted. Trace perihepatic fluid inferiorly. IMPRESSION: 1. Gallbladder distention containing intraluminal sludge and stone. Borderline mild gallbladder wall thickening. Findings could represent acute cholecystitis in the appropriate clinical setting. Consider nuclear medicine hepatic biliary scan based on clinical concern. 2. No biliary dilatation. No focal hepatic abnormality to explain elevated LFTs. Electronically Signed   By: Keith Rake M.D.   On: 02/16/2018 01:52    Review of Systems  Unable to perform ROS: Critical illness   Blood pressure (!) 116/54, pulse 94, temperature 98.4 F (36.9 C), resp. rate 17, height 4\' 11"  (1.499 m), weight 54.8 kg, SpO2 99 %. Physical Exam  Constitutional: She appears distressed.  mild  HENT:  Wearing BiPAP mask  Eyes: Right eye exhibits no discharge.  Neck:  Bruising on lower neck/upper chest,  possibly from line placement.  Cardiovascular:  Tachycardic at the time of my exam.  Respiratory:  On BiPAP  GI: Soft. She exhibits no distension. There is tenderness. There is no rebound and no guarding.  Neurological: She  is alert.  Responds to questioning appropriately.  Skin:  Bruising observed on upper chest.    Assessment/Plan: Is a 62 year old woman who is septic from pyelonephritis.  She has a lactic acidosis and a markedly elevated procalcitonin level.  The elevation of her alkaline phosphatase is nonspecific in the setting of acute sepsis.  I have reviewed the imaging and feel that acute cholecystitis is less likely and is not contributing to her septic picture at this time.  I do not feel that a nuclear medicine biliary scan is likely to contribute much to her care nor do I think that cholecystectomy is indicated at this time.  Should her condition change and signs point to the gallbladder as a cause of sepsis, at this time she is too acutely ill to undergo surgery and we would recommend placement of a cholecystostomy tube for biliary decompression.  However I still do not believe that this is the source of her illness.  Recommend continuing to treat her urosepsis with appropriate antibiotics and offering supportive care.   Fredirick Maudlin 02/16/2018, 8:24 AM

## 2018-02-16 NOTE — Progress Notes (Signed)
Attempted to place Left Femoral central venous catheter.  Was able to able to cannulate the vessel with the needle and thread guidewire in (correct placement into the left femoral vein as verified with ultrasound) , but unable to pass the dilator more than approximately 1.5 inches without meeting resistance, therefore attempt was aborted.  Pressure was held at the site.  No bleeding or hematoma noted.  No apparent complications.    Darel Hong, AGACNP-BC [facility] Pager: [phone] Cell: [phone]

## 2018-02-16 NOTE — Progress Notes (Signed)
Cathy Hong, NP notified of AM labs. Hemoglobin/hematocrit dropped to 6.3/20.1, lactic acid downtrending at 6.4 and calcium low at 6.1. 1 unit of RBCs ordered to be transfused, SQ heparin discontinued and 1 gram of calcium ordered. No new orders for lactic acid. Repeat labs will be sent after blood is tranfused.

## 2018-02-16 NOTE — Progress Notes (Signed)
[place], [place] 02/16/18  Subjective:   Conversation through video [place] interpreter Patient remains critically ill. UOP 150 last 12 hours Requiring pressors High dose of IV fluids approximately about 300 and hour CVP measured at about 15 or 16 Patient reports generalized abdominal discomfort Unsuccessful femoral central line placement attempt yesterday Patient was evaluated by urologist.  No indication for stent.  Serum creatinine remains elevated at 4.95.  Potassium is in the normal range at 3.5.  Lactic acid high at 6.4 although trending down  Objective:  Vital signs in last 24 hours:  Temp:  [97.9 F (36.6 C)-100.7 F (38.2 C)] 98.4 F (36.9 C) (11/28 0755) Pulse Rate:  [81-127] 94 (11/28 0700) Resp:  [16-33] 17 (11/28 0700) BP: (76-130)/(39-102) 116/54 (11/28 0755) SpO2:  [90 %-100 %] 99 % (11/28 0722) Weight:  [54.8 kg] 54.8 kg (11/27 1710)  Weight change: 7.172 kg Filed Weights   02/15/18 0442 02/15/18 0500 02/15/18 1710  Weight: 49.1 kg 49.1 kg 54.8 kg    Intake/Output:    Intake/Output Summary (Last 24 hours) at 02/16/2018 0923 Last data filed at 02/16/2018 0700 Gross per 24 hour  Intake 5808.98 ml  Output 145 ml  Net 5663.98 ml     Physical Exam: General:  Critically ill-appearing  HEENT  dry oral mucous membranes, anicteric  Neck  supple  Pulm/lungs  mild basilar crackles, nasal cannula oxygen supplementation  CVS/Heart  regular, tachycardic  Abdomen:   Mild, diffuse tenderness, soft  Extremities:  No edema  Neurologic:  Alert, able to interact and answer questions  Skin:  Normal turgor, no acute rashes   Foley in place       Basic Metabolic Panel:  Recent Labs  Lab 02/15/18 0419 02/15/18 1451 02/15/18 1720 02/15/18 1735 02/15/18 2304 02/16/18 0353  NA 129* 130*  --  135 138 138  K 6.1* 5.8* 4.4 4.5 3.6 3.5  CL 101 103  --  104 102 98  CO2 14* 9*  --  8* 12* 15*  GLUCOSE 99 131*  --  102* 155* 140*   BUN 79* 81*  --  86* 75* 73*  CREATININE 5.08* 5.25*  --  5.29* 4.96* 4.95*  CALCIUM 6.9* 6.9*  --  7.1* 5.9* 6.1*  MG  --   --   --   --  1.1* 1.7  PHOS  --   --   --   --  5.3*  --      CBC: Recent Labs  Lab 02/14/18 1844 02/15/18 0419 02/15/18 1720 02/16/18 0353  WBC 28.7* 24.5* 15.9* 27.7*  NEUTROABS  --   --  14.8*  --   HGB 8.9* 7.1* 8.2* 6.3*  HCT 27.1* 22.9* 27.5* 20.1*  MCV 88.3 93.1 97.5 89.7  PLT 314 313 258 197     No results found for: HEPBSAG, HEPBSAB, HEPBIGM    Microbiology:  Recent Results (from the past 240 hour(s))  Urine culture     Status: Abnormal (Preliminary result)   Collection Time: 02/14/18  6:44 PM  Result Value Ref Range Status   Specimen Description   Final    URINE, RANDOM Performed at [facility] [place], [address] [place], [place] [address]    Special Requests   Final    NONE Performed at [facility], [address] [place], [place] [address]    Culture >=100,000 COLONIES/mL GRAM NEGATIVE RODS (A)  Final   Report Status PENDING  Incomplete  MRSA PCR  Screening     Status: None   Collection Time: 02/15/18  5:13 PM  Result Value Ref Range Status   MRSA by PCR NEGATIVE NEGATIVE Final    Comment:        The GeneXpert MRSA Assay (FDA approved for NASAL specimens only), is one component of a comprehensive MRSA colonization surveillance program. It is not intended to diagnose MRSA infection nor to guide or monitor treatment for MRSA infections. Performed at [facility], [address] [place], [place] [address]   CULTURE, BLOOD (ROUTINE X 2) w Reflex to ID Panel     Status: None (Preliminary result)   Collection Time: 02/15/18  7:10 PM  Result Value Ref Range Status   Specimen Description BLOOD LEFT ANTECUBITAL  Final   Special Requests   Final    BOTTLES DRAWN AEROBIC AND ANAEROBIC Blood Culture adequate volume   Culture   Final    NO GROWTH < 12 HOURS Performed at [facility],  [address] [place], [place] [address]    Report Status PENDING  Incomplete  CULTURE, BLOOD (ROUTINE X 2) w Reflex to ID Panel     Status: None (Preliminary result)   Collection Time: 02/15/18  7:25 PM  Result Value Ref Range Status   Specimen Description BLOOD BLOOD RIGHT HAND  Final   Special Requests   Final    BOTTLES DRAWN AEROBIC AND ANAEROBIC Blood Culture adequate volume   Culture   Final    NO GROWTH < 12 HOURS Performed at [facility], [address] [place], [place] [address]    Report Status PENDING  Incomplete  C difficile quick scan w PCR reflex     Status: None   Collection Time: 02/16/18  5:20 AM  Result Value Ref Range Status   C Diff antigen NEGATIVE NEGATIVE Final   C Diff toxin NEGATIVE NEGATIVE Final   C Diff interpretation No C. difficile detected.  Final    Comment: Performed at [facility], [address] [place], [place] [address]    Coagulation Studies: Recent Labs    02/15/18 2304  LABPROT 16.6*  INR 1.36    Urinalysis: Recent Labs    02/14/18 1844 02/15/18 2010  COLORURINE YELLOW* YELLOW*  LABSPEC 1.011 1.011  PHURINE 5.0 5.0  GLUCOSEU NEGATIVE NEGATIVE  HGBUR LARGE* LARGE*  BILIRUBINUR NEGATIVE NEGATIVE  KETONESUR NEGATIVE NEGATIVE  PROTEINUR 100* 100*  NITRITE NEGATIVE NEGATIVE  LEUKOCYTESUR LARGE* MODERATE*      Imaging: Dg Chest 1 View  Result Date: 02/15/2018 CLINICAL DATA:  62 y/o  F; encounter for central line placement. EXAM: CHEST  1 VIEW COMPARISON:  02/15/2018 chest radiograph. FINDINGS: Stable cardiomegaly given projection and technique. Aortic calcific atherosclerosis. Left central venous catheter tip projects over the upper SVC. No pneumothorax. No pleural effusion, consolidation, or pneumothorax. Pulmonary venous hypertension. No acute osseous abnormality is evident. IMPRESSION: 1. Left central venous catheter tip projects over the upper SVC. 2. Cardiomegaly and pulmonary venous hypertension.  Electronically Signed   By: Kristine Garbe M.D.   On: 02/15/2018 22:41   US Renal  Result Date: 02/15/2018 CLINICAL DATA:  Renal failure. EXAM: RENAL / URINARY TRACT ULTRASOUND COMPLETE COMPARISON:  CT abdomen and pelvis 02/14/2018 FINDINGS: Right Kidney: Renal measurements: 8.7 x 4.3 x 5.0 cm = volume: 98 mL . Echogenicity within normal limits. No mass or hydronephrosis visualized. Left Kidney: Renal measurements: 13.2 x 6.6 x 6.0 cm = volume: 272 mL. Mild hydronephrosis without mass  or perinephric fluid collection identified. Bladder: Collapsed bladder, poorly evaluated. IMPRESSION: Persistent asymmetric enlargement of the left kidney with mild left hydronephrosis, similar to yesterday's CT. Electronically Signed   By: Logan Bores M.D.   On: 02/15/2018 14:32   Dg Chest Port 1 View  Result Date: 02/16/2018 CLINICAL DATA:  Follow-up pneumonia EXAM: PORTABLE CHEST 1 VIEW COMPARISON:  02/15/2018 FINDINGS: Cardiac shadow remains enlarged. Left jugular central line is again seen in the mid superior vena cava. No pneumothorax is noted. Increasing pulmonary vascular congestion with interstitial edema is seen. No focal confluent infiltrate or effusion is noted. IMPRESSION: Increasing changes of CHF. Electronically Signed   By: Inez Catalina M.D.   On: 02/16/2018 07:44   Dg Chest Port 1 View  Result Date: 02/15/2018 CLINICAL DATA:  Acute respiratory failure EXAM: PORTABLE CHEST 1 VIEW COMPARISON:  10/01/2013, CT chest 09/22/2015 FINDINGS: Cardiomegaly. Bibasilar atelectasis. No pleural effusion or pneumothorax. IMPRESSION: Cardiomegaly with bibasilar atelectasis Electronically Signed   By: Donavan Foil M.D.   On: 02/15/2018 17:46   Ct Renal Stone Study  Result Date: 02/14/2018 CLINICAL DATA:  62 y/o F; leukocytosis and elevated creatinine. Left flank pain. Nausea and vomiting. EXAM: CT ABDOMEN AND PELVIS WITHOUT CONTRAST TECHNIQUE: Multidetector CT imaging of the abdomen and pelvis was  performed following the standard protocol without IV contrast. COMPARISON:  10/02/2013 abdominal ultrasound. FINDINGS: Lower chest: Right lung base atelectasis. Trace left effusion. Mild coronary artery calcific atherosclerosis. Hepatobiliary: No focal liver abnormality is seen. Cholelithiasis. No gallbladder wall thickening. No biliary ductal dilatation. Pancreas: Unremarkable. No pancreatic ductal dilatation or surrounding inflammatory changes. Spleen: Normal in size without focal abnormality. Adrenals/Urinary Tract: Normal adrenal glands. Normal right kidney. No urinary stone disease identified. The left kidney is enlarged, there is extensive left-sided perinephric stranding, and there is mild left hydronephrosis. No obstructing stone or mass identified. Bladder wall thickening. Stomach/Bowel: Stomach is within normal limits. Appendix appears normal. No evidence of bowel wall thickening, distention, or inflammatory changes. Vascular/Lymphatic: Aortic atherosclerosis. No enlarged abdominal or pelvic lymph nodes. Reproductive: Uterus and bilateral adnexa are unremarkable. Other: No abdominal wall hernia or abnormality. No abdominopelvic ascites. Musculoskeletal: L5-S1 grade 1 anterolisthesis and chronic L5 pars defects. No acute fracture identified. IMPRESSION: 1. Enlarged left kidney, left perinephric stranding, and mild left hydronephrosis. Bladder wall thickening. No obstructing stone or mass identified. Findings probably represent infection of the renal collecting system, less likely recently passed stone. 2. Cholelithiasis. 3. Mild coronary artery and aortic calcific atherosclerosis. Electronically Signed   By: Kristine Garbe M.D.   On: 02/14/2018 20:11   [place] Ekg Site Rite  Result Date: 02/15/2018 If Site Rite image not attached, placement could not be confirmed due to current cardiac rhythm.  US Abdomen Limited Ruq  Result Date: 02/16/2018 CLINICAL DATA:  Elevated LFTs. EXAM: ULTRASOUND  ABDOMEN LIMITED RIGHT UPPER QUADRANT COMPARISON:  CT 02/14/2018 FINDINGS: Gallbladder: Distended containing intraluminal sludge and shadowing 1.2 cm gallstone. Mild gallbladder wall thickening of 3.6 mm. No sonographic Murphy sign noted by sonographer. Common bile duct: Diameter: 4 mm, normal. Liver: No focal lesion identified. Within normal limits in parenchymal echogenicity. Portal vein is patent on color Doppler imaging with normal direction of blood flow towards the liver. Right pleural effusion incidentally noted. Trace perihepatic fluid inferiorly. IMPRESSION: 1. Gallbladder distention containing intraluminal sludge and stone. Borderline mild gallbladder wall thickening. Findings could represent acute cholecystitis in the appropriate clinical setting. Consider nuclear medicine hepatic biliary scan based on clinical concern. 2. No biliary dilatation.  No focal hepatic abnormality to explain elevated LFTs. Electronically Signed   By: Keith Rake M.D.   On: 02/16/2018 01:52     Medications:   . albumin human    . meropenem (MERREM) IV    . norepinephrine (LEVOPHED) Adult infusion 3 mcg/min (02/16/18 0700)  .  sodium bicarbonate (isotonic) infusion in sterile water 200 mL/hr at 02/16/18 0700   . sodium chloride   Intravenous Once  . amLODipine  2.5 mg Oral Daily  . aspirin EC  81 mg Oral Daily  . atorvastatin  40 mg Oral QHS  . docusate sodium  100 mg Oral BID  . feeding supplement  1 Container Oral BID BM  . feeding supplement (NEPRO CARB STEADY)  237 mL Oral Q24H  . insulin aspart  0-9 Units Subcutaneous TID WC  . insulin glargine  28 Units Subcutaneous QHS  . loratadine  10 mg Oral Daily  . metoprolol tartrate  25 mg Oral BID   acetaminophen **OR** acetaminophen, ipratropium-albuterol, ondansetron **OR** ondansetron (ZOFRAN) IV, oxyCODONE  Assessment/ Plan:  62 y.o.Hispanic female with diabetes, hypertension, coronary atherosclerosis was admitted on 02/14/2018 with cute  pyelonephritis.   1.  Acute renal failure 2.  Hyperkalemia 3.  Acute pyelonephritis, sepsis (Gram neg rods, urinary source) 4.  Metabolic Acidosis  The only baseline creatinine information available at this time is 0.80 from July 2015.  Admission creatinine yesterday was 5.30 at admission which has slightly improved to 4.95 today.  Potassium has corrected with shifting measures and bicarb administration .    - Agree with holding losartan/HCTZ and metformin for now - Hold amlodipine - Discussed with patient that if she gets volume overload or electrolyte emergency arises, she may need renal replacement therapy  Antibiotics as per ICU/in the medicine team.    LOS: 2 Esias Mory 11/28/20199:23 AM  [place], [place]  Note: This note was prepared with Dragon dictation. Any transcription errors are unintentional

## 2018-02-16 NOTE — Plan of Care (Addendum)
  Problem: Coping: Goal: Level of anxiety will decrease Outcome: Progressing   Problem: Safety: Goal: Ability to remain free from injury will improve Outcome: Progressing   Problem: Skin Integrity: Goal: Risk for impaired skin integrity will decrease Outcome: Progressing   Problem: Clinical Measurements: Goal: Will remain free from infection Outcome: Not Met (add Reason) Note:  WBCS elevated, started on new antibiotics Goal: Diagnostic test results will improve Outcome: Not Met (add Reason) Goal: Respiratory complications will improve Outcome: Not Met (add Reason) Note:  Pt placed in bipap   Problem: Nutrition: Goal: Adequate nutrition will be maintained Outcome: Not Met (add Reason) Note:  Pt currently NPO while on bipap   Problem: Elimination: Goal: Will not experience complications related to bowel motility Outcome: Not Met (add Reason) Note:  Pt had 4 bowel movements during shift, c diff sample sent

## 2018-02-17 ENCOUNTER — Inpatient Hospital Stay: Payer: 59

## 2018-02-17 DIAGNOSIS — K810 Acute cholecystitis: Secondary | ICD-10-CM

## 2018-02-17 LAB — PHOSPHORUS
Phosphorus: 6.8 mg/dL — ABNORMAL HIGH (ref 2.5–4.6)
Phosphorus: 5 mg/dL — ABNORMAL HIGH (ref 2.5–4.6)

## 2018-02-17 LAB — CBC WITH DIFFERENTIAL/PLATELET
Abs Immature Granulocytes: 0.85 10*3/uL — ABNORMAL HIGH (ref 0.00–0.07)
Basophils Absolute: 0.1 10*3/uL (ref 0.0–0.1)
Basophils Relative: 0 %
Eosinophils Absolute: 0.3 10*3/uL (ref 0.0–0.5)
Eosinophils Relative: 2 %
HCT: 22.7 % — ABNORMAL LOW (ref 36.0–46.0)
Hemoglobin: 7.6 g/dL — ABNORMAL LOW (ref 12.0–15.0)
IMMATURE GRANULOCYTES: 4 %
Lymphocytes Relative: 1 %
Lymphs Abs: 0.2 10*3/uL — ABNORMAL LOW (ref 0.7–4.0)
MCH: 27 pg (ref 26.0–34.0)
MCHC: 33.5 g/dL (ref 30.0–36.0)
MCV: 80.8 fL (ref 80.0–100.0)
MONOS PCT: 1 %
Monocytes Absolute: 0.1 10*3/uL (ref 0.1–1.0)
NEUTROS PCT: 92 %
Neutro Abs: 17.8 10*3/uL — ABNORMAL HIGH (ref 1.7–7.7)
Platelets: 157 10*3/uL (ref 150–400)
RBC: 2.81 MIL/uL — ABNORMAL LOW (ref 3.87–5.11)
RDW: 19.7 % — ABNORMAL HIGH (ref 11.5–15.5)
WBC: 19.4 10*3/uL — ABNORMAL HIGH (ref 4.0–10.5)
nRBC: 0 % (ref 0.0–0.2)

## 2018-02-17 LAB — URINE CULTURE: CULTURE: NO GROWTH

## 2018-02-17 LAB — PATHOLOGIST SMEAR REVIEW

## 2018-02-17 LAB — BLOOD GAS, VENOUS
Acid-Base Excess: 2.3 mmol/L — ABNORMAL HIGH (ref 0.0–2.0)
Acid-base deficit: 1.7 mmol/L (ref 0.0–2.0)
Acid-base deficit: 5 mmol/L — ABNORMAL HIGH (ref 0.0–2.0)
BICARBONATE: 18.4 mmol/L — AB (ref 20.0–28.0)
Bicarbonate: 22.1 mmol/L (ref 20.0–28.0)
Bicarbonate: 26.5 mmol/L (ref 20.0–28.0)
Delivery systems: POSITIVE
Delivery systems: POSITIVE
FIO2: 0.3
FIO2: 0.3
O2 Saturation: 66.9 %
O2 Saturation: 76 %
O2 Saturation: 88 %
Patient temperature: 37
Patient temperature: 37
Patient temperature: 37
pCO2, Ven: 29 mmHg — ABNORMAL LOW (ref 44.0–60.0)
pCO2, Ven: 34 mmHg — ABNORMAL LOW (ref 44.0–60.0)
pCO2, Ven: 39 mmHg — ABNORMAL LOW (ref 44.0–60.0)
pH, Ven: 7.41 (ref 7.250–7.430)
pH, Ven: 7.42 (ref 7.250–7.430)
pH, Ven: 7.44 — ABNORMAL HIGH (ref 7.250–7.430)
pO2, Ven: 34 mmHg (ref 32.0–45.0)
pO2, Ven: 39 mmHg (ref 32.0–45.0)
pO2, Ven: 54 mmHg — ABNORMAL HIGH (ref 32.0–45.0)

## 2018-02-17 LAB — BASIC METABOLIC PANEL
Anion gap: 25 — ABNORMAL HIGH (ref 5–15)
Anion gap: 20 — ABNORMAL HIGH (ref 5–15)
BUN: 85 mg/dL — ABNORMAL HIGH (ref 8–23)
BUN: 59 mg/dL — ABNORMAL HIGH (ref 8–23)
CO2: 22 mmol/L (ref 22–32)
CO2: 28 mmol/L (ref 22–32)
Calcium: 6.2 mg/dL — CL (ref 8.9–10.3)
Calcium: 7.6 mg/dL — ABNORMAL LOW (ref 8.9–10.3)
Chloride: 91 mmol/L — ABNORMAL LOW (ref 98–111)
Chloride: 91 mmol/L — ABNORMAL LOW (ref 98–111)
Creatinine, Ser: 5.31 mg/dL — ABNORMAL HIGH (ref 0.44–1.00)
Creatinine, Ser: 4.12 mg/dL — ABNORMAL HIGH (ref 0.44–1.00)
GFR calc Af Amer: 9 mL/min — ABNORMAL LOW (ref >60)
GFR calc non Af Amer: 8 mL/min — ABNORMAL LOW (ref >60)
GFR calc non Af Amer: 11 mL/min — ABNORMAL LOW (ref >60)
GFR, EST AFRICAN AMERICAN: 13 mL/min — AB (ref >60)
GLUCOSE: 177 mg/dL — AB (ref 70–99)
Glucose, Bld: 193 mg/dL — ABNORMAL HIGH (ref 70–99)
Potassium: 3.2 mmol/L — ABNORMAL LOW (ref 3.5–5.1)
Potassium: 4.4 mmol/L (ref 3.5–5.1)
Sodium: 138 mmol/L (ref 135–145)
Sodium: 139 mmol/L (ref 135–145)

## 2018-02-17 LAB — CALCIUM, IONIZED
CALCIUM, IONIZED, SERUM: 3.2 mg/dL — AB (ref 4.5–5.6)
Calcium, Ionized, Serum: 3.3 mg/dL — ABNORMAL LOW (ref 4.5–5.6)

## 2018-02-17 LAB — GLUCOSE, CAPILLARY
Glucose-Capillary: 164 mg/dL — ABNORMAL HIGH (ref 70–99)
Glucose-Capillary: 178 mg/dL — ABNORMAL HIGH (ref 70–99)
Glucose-Capillary: 183 mg/dL — ABNORMAL HIGH (ref 70–99)
Glucose-Capillary: 185 mg/dL — ABNORMAL HIGH (ref 70–99)
Glucose-Capillary: 176 mg/dL — ABNORMAL HIGH (ref 70–99)

## 2018-02-17 LAB — ALBUMIN: Albumin: 3 g/dL — ABNORMAL LOW (ref 3.5–5.0)

## 2018-02-17 LAB — MAGNESIUM
Magnesium: 1.6 mg/dL — ABNORMAL LOW (ref 1.7–2.4)
Magnesium: 2 mg/dL (ref 1.7–2.4)

## 2018-02-17 LAB — PROCALCITONIN: Procalcitonin: >150 ng/mL

## 2018-02-17 LAB — LACTIC ACID, PLASMA: Lactic Acid, Venous: 2.4 mmol/L (ref 0.5–1.9)

## 2018-02-17 MED ORDER — SODIUM CHLORIDE 0.9% FLUSH
10.0000 mL | Freq: Two times a day (BID) | INTRAVENOUS | Status: DC
  Administered 2018-02-17 – 2018-02-23 (×13): 10 mL

## 2018-02-17 MED ORDER — CHLORHEXIDINE GLUCONATE 0.12 % MT SOLN
15.0000 mL | Freq: Two times a day (BID) | OROMUCOSAL | Status: DC
  Administered 2018-02-17 – 2018-02-18 (×2): 15 mL via OROMUCOSAL
  Filled 2018-02-17: qty 15

## 2018-02-17 MED ORDER — SODIUM CHLORIDE 0.9 % IV SOLN
1.0000 g | Freq: Once | INTRAVENOUS | Status: AC
  Administered 2018-02-17: 1 g via INTRAVENOUS
  Filled 2018-02-17: qty 1

## 2018-02-17 MED ORDER — ORAL CARE MOUTH RINSE
15.0000 mL | Freq: Two times a day (BID) | OROMUCOSAL | Status: DC
  Administered 2018-02-18 – 2018-02-22 (×6): 15 mL via OROMUCOSAL

## 2018-02-17 MED ORDER — ACETAMINOPHEN 325 MG PO TABS
650.0000 mg | ORAL_TABLET | Freq: Three times a day (TID) | ORAL | Status: DC | PRN
  Administered 2018-02-17 – 2018-02-22 (×5): 650 mg via ORAL
  Filled 2018-02-17 (×5): qty 2

## 2018-02-17 MED ORDER — SODIUM CHLORIDE 0.9 % IV SOLN
1.0000 g | INTRAVENOUS | Status: DC
  Filled 2018-02-17: qty 10

## 2018-02-17 MED ORDER — METHYLPREDNISOLONE SODIUM SUCC 125 MG IJ SOLR
60.0000 mg | Freq: Four times a day (QID) | INTRAMUSCULAR | Status: AC
  Administered 2018-02-17 – 2018-02-18 (×4): 60 mg via INTRAVENOUS
  Filled 2018-02-17 (×4): qty 2

## 2018-02-17 MED ORDER — MORPHINE SULFATE (PF) 2 MG/ML IV SOLN
2.0000 mg | Freq: Once | INTRAVENOUS | Status: AC
  Administered 2018-02-17: 2 mg via INTRAVENOUS
  Filled 2018-02-17: qty 1

## 2018-02-17 MED ORDER — LABETALOL HCL 5 MG/ML IV SOLN
10.0000 mg | INTRAVENOUS | Status: DC | PRN
  Administered 2018-02-17: 10 mg via INTRAVENOUS
  Filled 2018-02-17: qty 4

## 2018-02-17 MED ORDER — CALCIUM GLUCONATE-NACL 1-0.675 GM/50ML-% IV SOLN
1.0000 g | Freq: Once | INTRAVENOUS | Status: AC
  Administered 2018-02-17: 1000 mg via INTRAVENOUS
  Filled 2018-02-17: qty 50

## 2018-02-17 MED ORDER — DIPHENHYDRAMINE HCL 50 MG/ML IJ SOLN
12.5000 mg | Freq: Once | INTRAMUSCULAR | Status: AC
  Administered 2018-02-17: 12.5 mg via INTRAVENOUS
  Filled 2018-02-17: qty 1

## 2018-02-17 MED ORDER — TECHNETIUM TC 99M MEBROFENIN IV KIT
5.8500 | PACK | Freq: Once | INTRAVENOUS | Status: AC | PRN
  Administered 2018-02-17: 5.85 via INTRAVENOUS

## 2018-02-17 MED ORDER — SODIUM CHLORIDE 0.9 % IV SOLN
1.0000 g | INTRAVENOUS | Status: DC
  Administered 2018-02-18 – 2018-02-20 (×3): 1 g via INTRAVENOUS
  Filled 2018-02-17 (×3): qty 1
  Filled 2018-02-17: qty 10

## 2018-02-17 MED ORDER — ACETAMINOPHEN 650 MG RE SUPP: 650.0000 mg | Freq: Four times a day (QID) | RECTAL | Status: DC | PRN

## 2018-02-17 MED ORDER — MAGNESIUM SULFATE IN D5W 1-5 GM/100ML-% IV SOLN
1.0000 g | Freq: Once | INTRAVENOUS | Status: AC
  Administered 2018-02-17: 1 g via INTRAVENOUS
  Filled 2018-02-17: qty 100

## 2018-02-17 MED ORDER — CHLORHEXIDINE GLUCONATE CLOTH 2 % EX PADS
6.0000 | MEDICATED_PAD | Freq: Every day | CUTANEOUS | Status: DC
  Administered 2018-02-18: 6 via TOPICAL

## 2018-02-17 MED ORDER — SODIUM CHLORIDE 0.9% FLUSH: 10.0000 mL | INTRAVENOUS | Status: DC | PRN

## 2018-02-17 NOTE — Progress Notes (Signed)
Pre HD assessment    02/17/18 1621  Neurological  Level of Consciousness Alert  Orientation Level Oriented X4  Respiratory  Respiratory Pattern Regular;Unlabored  Chest Assessment Chest expansion symmetrical  Cardiac  ECG Monitor Yes  Cardiac Rhythm NSR  Vascular  R Radial Pulse +2  L Radial Pulse +2  Integumentary  Integumentary (WDL) X  Skin Color Appropriate for ethnicity  Musculoskeletal  Musculoskeletal (WDL) X  Generalized Weakness Yes  Assistive Device BSC  GU Assessment  Genitourinary (WDL) X  Genitourinary Symptoms  (HD)  Psychosocial  Psychosocial (WDL) WDL  Patient Behaviors Appropriate for situation;Cooperative;Calm  Needs Expressed Physical  Psychosocial Additional Assessments Family behavior  Family Behavior Supportive;Appropriate for situation;Calm;Cooperative  Emotional support given Given to patient;Given to patient's family

## 2018-02-17 NOTE — Progress Notes (Signed)
[place], [place] 02/17/18  Subjective:   Conversation through video Dorris interpreter Patient remains critically ill. UOP 50 last 24 hours Patient is now requiring noninvasive positive pressure ventilation therapy Serum creatinine remains critically elevated at 5.3, BUN 85  Objective:  Vital signs in last 24 hours:  Temp:  [97 F (36.1 C)-98.6 F (37 C)] 98.4 F (36.9 C) (11/29 0800) Pulse Rate:  [94-114] 105 (11/29 0900) Resp:  [14-26] 16 (11/29 0900) BP: (109-169)/(51-69) 133/55 (11/29 0900) SpO2:  [93 %-98 %] 93 % (11/29 0900) FiO2 (%):  [30 %] 30 % (11/29 0148) Weight:  [65.6 kg] 65.6 kg (11/29 0500)  Weight change: 10.8 kg Filed Weights   02/15/18 0500 02/15/18 1710 02/17/18 0500  Weight: 49.1 kg 54.8 kg 65.6 kg    Intake/Output:    Intake/Output Summary (Last 24 hours) at 02/17/2018 0915 Last data filed at 02/17/2018 0800 Gross per 24 hour  Intake 2915.03 ml  Output 125 ml  Net 2790.03 ml     Physical Exam: General:  Critically ill-appearing  HEENT  BIPAP mask in place  Neck  supple  Pulm/lungs  mild basilar crackles,    CVS/Heart  regular, tachycardic  Abdomen:   Mild, diffuse tenderness, soft  Extremities:  2+ edema  Neurologic:  Alert, able to interact and answer questions  Skin:  Normal turgor, no acute rashes   Foley in place       Basic Metabolic Panel:  Recent Labs  Lab 02/15/18 2304 02/16/18 0353 02/16/18 1500 02/16/18 2058 02/17/18 0458  NA 138 138 138 138 138  K 3.6 3.5 3.4* 3.4* 3.2*  CL 102 98 93* 93* 91*  CO2 12* 15* 18* 18* 22  GLUCOSE 155* 140* 178* 181* 177*  BUN 75* 73* 78* 80* 85*  CREATININE 4.96* 4.95* 5.10* 5.10* 5.31*  CALCIUM 5.9* 6.1* 6.1* 6.1* 6.2*  MG 1.1* 1.7 1.7 1.7 1.6*  PHOS 5.3*  --  6.6* 6.6* 6.8*     CBC: Recent Labs  Lab 02/15/18 0419 02/15/18 1720 02/16/18 0353 02/16/18 1200 02/16/18 1848 02/16/18 2058 02/17/18 0458  WBC 24.5* 15.9* 27.7*  --   --  26.2*  19.4*  NEUTROABS  --  14.8*  --   --   --  23.7* 17.8*  HGB 7.1* 8.2* 6.3* 7.9* 7.9* 8.0* 7.6*  HCT 22.9* 27.5* 20.1* 23.9* 24.1* 24.1* 22.7*  MCV 93.1 97.5 89.7  --   --  81.7 80.8  PLT 313 258 197  --   --  172 157     No results found for: HEPBSAG, HEPBSAB, HEPBIGM    Microbiology:  Recent Results (from the past 240 hour(s))  Urine culture     Status: Abnormal   Collection Time: 02/14/18  6:44 PM  Result Value Ref Range Status   Specimen Description   Final    URINE, RANDOM Performed at [facility], [address] [place], [place] [address]    Special Requests   Final    NONE Performed at [facility], [address] [place], [place] [address]    Culture >=100,000 COLONIES/mL ESCHERICHIA COLI (A)  Final   Report Status 02/17/2018 FINAL  Final   Organism ID, Bacteria ESCHERICHIA COLI (A)  Final      Susceptibility   Escherichia coli - MIC*    AMPICILLIN <=2 SENSITIVE Sensitive     CEFAZOLIN <=4 SENSITIVE Sensitive     CEFTRIAXONE <=1 SENSITIVE Sensitive     CIPROFLOXACIN <=0.25  SENSITIVE Sensitive     GENTAMICIN <=1 SENSITIVE Sensitive     IMIPENEM <=0.25 SENSITIVE Sensitive     NITROFURANTOIN <=16 SENSITIVE Sensitive     TRIMETH/SULFA <=20 SENSITIVE Sensitive     AMPICILLIN/SULBACTAM <=2 SENSITIVE Sensitive     PIP/TAZO <=4 SENSITIVE Sensitive     Extended ESBL NEGATIVE Sensitive     * >=100,000 COLONIES/mL ESCHERICHIA COLI  MRSA PCR Screening     Status: None   Collection Time: 02/15/18  5:13 PM  Result Value Ref Range Status   MRSA by PCR NEGATIVE NEGATIVE Final    Comment:        The GeneXpert MRSA Assay (FDA approved for NASAL specimens only), is one component of a comprehensive MRSA colonization surveillance program. It is not intended to diagnose MRSA infection nor to guide or monitor treatment for MRSA infections. Performed at [facility], [address] [place], [place] [address]   CULTURE, BLOOD (ROUTINE X 2) w  Reflex to ID Panel     Status: None (Preliminary result)   Collection Time: 02/15/18  7:10 PM  Result Value Ref Range Status   Specimen Description BLOOD LEFT ANTECUBITAL  Final   Special Requests   Final    BOTTLES DRAWN AEROBIC AND ANAEROBIC Blood Culture adequate volume   Culture   Final    NO GROWTH 2 DAYS Performed at [facility], [address], [place], [place] [address]    Report Status PENDING  Incomplete  CULTURE, BLOOD (ROUTINE X 2) w Reflex to ID Panel     Status: None (Preliminary result)   Collection Time: 02/15/18  7:25 PM  Result Value Ref Range Status   Specimen Description BLOOD BLOOD RIGHT HAND  Final   Special Requests   Final    BOTTLES DRAWN AEROBIC AND ANAEROBIC Blood Culture adequate volume   Culture   Final    NO GROWTH 2 DAYS Performed at [facility], [address], [place], [place] [address]    Report Status PENDING  Incomplete  Urine Culture     Status: None   Collection Time: 02/15/18  8:10 PM  Result Value Ref Range Status   Specimen Description   Final    URINE, CATHETERIZED Performed at [facility], [address], [place], [place] [address]    Special Requests   Final    NONE Performed at [facility], [address], [place], [place] [address]    Culture   Final    NO GROWTH Performed at [facility], [place] [address], [place], [place] [address]    Report Status 02/17/2018 FINAL  Final  C difficile quick scan w PCR reflex     Status: None   Collection Time: 02/16/18  5:20 AM  Result Value Ref Range Status   C Diff antigen NEGATIVE NEGATIVE Final   C Diff toxin NEGATIVE NEGATIVE Final   C Diff interpretation No C. difficile detected.  Final    Comment: Performed at [facility], [address] [place], [place] [address]    Coagulation Studies: Recent Labs    02/15/18 2304  LABPROT 16.6*  INR 1.36    Urinalysis: Recent Labs    02/14/18 1844 02/15/18 2010  COLORURINE  YELLOW* YELLOW*  LABSPEC 1.011 1.011  PHURINE 5.0 5.0  GLUCOSEU NEGATIVE NEGATIVE  HGBUR LARGE* LARGE*  BILIRUBINUR NEGATIVE NEGATIVE  KETONESUR NEGATIVE NEGATIVE  PROTEINUR 100* 100*  NITRITE NEGATIVE NEGATIVE  LEUKOCYTESUR LARGE* MODERATE*  Imaging: Ct Abdomen Pelvis Wo Contrast  Result Date: 02/16/2018 CLINICAL DATA:  Unexplained anemia. Recent sepsis. A CT scan from February 14, 2018 demonstrated an abnormal left kidney thought to represent infection of the renal collecting system with a recently passed stone considered less likely. EXAM: CT ABDOMEN AND PELVIS WITHOUT CONTRAST TECHNIQUE: Multidetector CT imaging of the abdomen and pelvis was performed following the standard protocol without IV contrast. COMPARISON:  February 14, 2018 CT scan FINDINGS: Lower chest: Bilateral pleural effusions are identified, larger on the left and new on the right. Opacities underlying the effusions are likely atelectasis with infiltrate less likely. Coronary artery calcifications are noted. The lower chest is otherwise unremarkable. Hepatobiliary: The liver is unremarkable. The gallbladder is distended and contains a single stone. The gallbladder was better assessed with an ultrasound from this morning. Pancreas: Unremarkable. No pancreatic ductal dilatation or surrounding inflammatory changes. Spleen: Normal in size without focal abnormality. Adrenals/Urinary Tract: Adrenal glands are normal. No renal stones identified. No hydronephrosis on the right. There is persistent hydronephrosis on the left. There is bilateral perinephric stranding. There is mild left ureterectasis. The right ureter is normal. No ureteral stones. The bladder is decompressed with a Foley catheter but it remains mildly thick walled. There is air in the bladder from the Foley catheter. Stomach/Bowel: The colon is unremarkable.  The appendix is normal. Vascular/Lymphatic: Atherosclerotic changes are seen in the nonaneurysmal aorta. No  adenopathy. Reproductive: Uterus and bilateral adnexa are unremarkable. Other: There is increased attenuation diffusely throughout the subcutaneous fat. There is also increased attenuation in the intra-abdominal fat as well as mild ascites extending down the pericolic gutters into the pelvis, new. No free air. Musculoskeletal: No acute or significant osseous findings. IMPRESSION: 1. Left-sided hydronephrosis persists. On the previous study, there was increased perinephric stranding on the left as well compared to the right. I suspect the hydronephrosis on the left is likely acute to subacute although is strictly speaking age indeterminate. I doubt the findings are from a recently passed stone as there has been no significant change since February 14, 2018. An underlying obstruction which is not radiopaque such as a blood clot or urothelial lesion are possible. Infection of the left renal collecting system is also possible. Recommend clinical correlation and correlation with a urinalysis. 2. The gallbladder is distended with a single stone. If there is concern for acute cholecystitis, recommend HIDA scan. 3. Bilateral pleural effusions, increased attenuation in the subcutaneous fat, and increased attenuation in the intra-abdominal fat all suggest third spacing of fluid/edema. 4. The increased perinephric stranding on the right may be due to the overall increased third-spacing of fluid/edema. No hydronephrosis or stones seen on the right. 5. Opacity under the pleural effusions is likely atelectasis. 6. Coronary artery calcifications and abdominal aorta atherosclerotic change. Electronically Signed   By: Dorise Bullion III M.D   On: 02/16/2018 09:29   Dg Chest 1 View  Result Date: 02/15/2018 CLINICAL DATA:  62 y/o  F; encounter for central line placement. EXAM: CHEST  1 VIEW COMPARISON:  02/15/2018 chest radiograph. FINDINGS: Stable cardiomegaly given projection and technique. Aortic calcific atherosclerosis.  Left central venous catheter tip projects over the upper SVC. No pneumothorax. No pleural effusion, consolidation, or pneumothorax. Pulmonary venous hypertension. No acute osseous abnormality is evident. IMPRESSION: 1. Left central venous catheter tip projects over the upper SVC. 2. Cardiomegaly and pulmonary venous hypertension. Electronically Signed   By: Kristine Garbe M.D.   On: 02/15/2018 22:41   US Renal  Result Date: 02/15/2018 CLINICAL DATA:  Renal failure. EXAM: RENAL / URINARY TRACT ULTRASOUND COMPLETE COMPARISON:  CT abdomen and pelvis 02/14/2018 FINDINGS: Right Kidney: Renal measurements: 8.7 x 4.3 x 5.0 cm = volume: 98 mL . Echogenicity within normal limits. No mass or hydronephrosis visualized. Left Kidney: Renal measurements: 13.2 x 6.6 x 6.0 cm = volume: 272 mL. Mild hydronephrosis without mass or perinephric fluid collection identified. Bladder: Collapsed bladder, poorly evaluated. IMPRESSION: Persistent asymmetric enlargement of the left kidney with mild left hydronephrosis, similar to yesterday's CT. Electronically Signed   By: Logan Bores M.D.   On: 02/15/2018 14:32   Dg Chest Port 1 View  Result Date: 02/17/2018 CLINICAL DATA:  Initial evaluation for new central line, right side. EXAM: PORTABLE CHEST 1 VIEW COMPARISON:  Prior radiograph from 02/17/2018 FINDINGS: Left IJ approach central venous catheter has been replaced with a new larger caliber line, tip projects over the right atrium. No visible right-sided catheter. Mild cardiomegaly, stable. Mediastinal silhouette within normal limits. Lungs hypoinflated. Perihilar vascular congestion with diffuse interstitial prominence, compatible with mild diffuse pulmonary interstitial edema, similar to previous. Persistent small right pleural effusion. Associated bibasilar opacities favored to reflect atelectasis and/or edema. Infiltrates could be considered in the correct clinical setting. No pneumothorax. Osseous structures  unchanged. IMPRESSION: 1. Interval placement of left IJ approach central venous catheter with tip projecting over the right atrium. No visible right-sided catheter. No evidence of pneumothorax. 2. Perihilar vascular congestion with diffuse interstitial prominence, compatible with mild diffuse pulmonary interstitial edema, similar to previous. 3. Small right pleural effusion. Electronically Signed   By: Jeannine Boga M.D.   On: 02/17/2018 06:54   Dg Chest Port 1 View  Result Date: 02/17/2018 CLINICAL DATA:  Pneumonia. EXAM: PORTABLE CHEST 1 VIEW COMPARISON:  Radiograph yesterday. FINDINGS: Left internal jugular central venous catheter tip in the proximal SVC. Low lung volumes with bibasilar opacities and pleural effusions, progressive. Mild cardiomegaly unchanged. Vascular congestion with central pulmonary edema, similar to prior. No pneumothorax. IMPRESSION: Progressive pleural effusions and bibasilar opacities, likely atelectasis. Unchanged pulmonary edema. Electronically Signed   By: Keith Rake M.D.   On: 02/17/2018 03:46   Dg Chest Port 1 View  Result Date: 02/16/2018 CLINICAL DATA:  Follow-up pneumonia EXAM: PORTABLE CHEST 1 VIEW COMPARISON:  02/15/2018 FINDINGS: Cardiac shadow remains enlarged. Left jugular central line is again seen in the mid superior vena cava. No pneumothorax is noted. Increasing pulmonary vascular congestion with interstitial edema is seen. No focal confluent infiltrate or effusion is noted. IMPRESSION: Increasing changes of CHF. Electronically Signed   By: Inez Catalina M.D.   On: 02/16/2018 07:44   Dg Chest Port 1 View  Result Date: 02/15/2018 CLINICAL DATA:  Acute respiratory failure EXAM: PORTABLE CHEST 1 VIEW COMPARISON:  10/01/2013, CT chest 09/22/2015 FINDINGS: Cardiomegaly. Bibasilar atelectasis. No pleural effusion or pneumothorax. IMPRESSION: Cardiomegaly with bibasilar atelectasis Electronically Signed   By: Donavan Foil M.D.   On: 02/15/2018 17:46    [place] Ekg Site Rite  Result Date: 02/15/2018 If Site Rite image not attached, placement could not be confirmed due to current cardiac rhythm.  US Abdomen Limited Ruq  Result Date: 02/16/2018 CLINICAL DATA:  Elevated LFTs. EXAM: ULTRASOUND ABDOMEN LIMITED RIGHT UPPER QUADRANT COMPARISON:  CT 02/14/2018 FINDINGS: Gallbladder: Distended containing intraluminal sludge and shadowing 1.2 cm gallstone. Mild gallbladder wall thickening of 3.6 mm. No sonographic Murphy sign noted by sonographer. Common bile duct: Diameter: 4 mm, normal. Liver: No focal lesion identified. Within normal limits in  parenchymal echogenicity. Portal vein is patent on color Doppler imaging with normal direction of blood flow towards the liver. Right pleural effusion incidentally noted. Trace perihepatic fluid inferiorly. IMPRESSION: 1. Gallbladder distention containing intraluminal sludge and stone. Borderline mild gallbladder wall thickening. Findings could represent acute cholecystitis in the appropriate clinical setting. Consider nuclear medicine hepatic biliary scan based on clinical concern. 2. No biliary dilatation. No focal hepatic abnormality to explain elevated LFTs. Electronically Signed   By: Keith Rake M.D.   On: 02/16/2018 01:52     Medications:   . calcium gluconate    . [START ON 02/18/2018] cefTRIAXone (ROCEPHIN)  IV    . magnesium sulfate 1 - 4 g bolus IVPB    . norepinephrine (LEVOPHED) Adult infusion Stopped (02/16/18 1156)   . aspirin EC  81 mg Oral Daily  . atorvastatin  40 mg Oral QHS  . docusate sodium  100 mg Oral BID  . feeding supplement  1 Container Oral BID BM  . feeding supplement (NEPRO CARB STEADY)  237 mL Oral Q24H  . insulin aspart  0-9 Units Subcutaneous TID WC  . insulin glargine  28 Units Subcutaneous QHS  . ipratropium-albuterol  3 mL Nebulization Q6H  . loratadine  10 mg Oral Daily  . metoprolol tartrate  25 mg Oral BID  . sodium chloride flush  10-40 mL Intracatheter Q12H    acetaminophen **OR** acetaminophen, ipratropium-albuterol, ondansetron **OR** ondansetron (ZOFRAN) IV, oxyCODONE, sodium chloride flush  Assessment/ Plan:  62 y.o.Hispanic female with diabetes, hypertension, coronary atherosclerosis was admitted on 02/14/2018 with cute pyelonephritis.   1.  Acute renal failure, ATN from severe sepsis.  No recent creatinine.  It was 0.80 from July 2015 2.  Hyperkalemia, now hypokalemia 3.  Acute pyelonephritis, sepsis (E. coli, urinary source) 4.  Metabolic Acidosis 5.  Acute pulmonary edema and volume overload   - Discussed with ICU team, patient and family that Patient remains oliguric and she has developed fluid overload. She would benefit from starting hemodialysis.  Procedure explained to the family.  All questions answered. We plan to start hemodialysis today with intent of gentle ultrafiltration -May discontinue bicarbonate infusion - reassess for need of HD daily   LOS: 3 Jerra Huckeby 11/29/20199:15 AM  [place], [place]  Note: This note was prepared with Dragon dictation. Any transcription errors are unintentional

## 2018-02-17 NOTE — Care Management (Addendum)
Patient admitted with sepsis due to pyelonephritis. Patient transferred to icu stepdown 11/27 for worsening renal status and need for closer monitoring.  Transfer to ICU level of care 11/28 and placed on bicarb drip, norepinephrine drip and continue bipap for impending respiratory failure. Nephrology is consulting.  Unsuccessful attempt at placing central venous catheter. Placing trialysis catheter was successful for dialysis in left jugular.  Has received packed cells for anemia. 6:12p- Receiving hemodialysis treatment and need for therapy will be addressed on a daily basis

## 2018-02-17 NOTE — Progress Notes (Signed)
[place] [facility] [place] NAME: Cathy Cline    MR#:  [id number]  DATE OF BIRTH:  11-07-1955  SUBJECTIVE:  CHIEF COMPLAINT:   Chief Complaint  Patient presents with  . Abnormal Lab  Patient seen and evaluated today Family is at bedside Decreased abdominal pain On BiPAP for respiratory distress No fever No nausea and vomiting  REVIEW OF SYSTEMS:    ROS  CONSTITUTIONAL: No documented fever. Has fatigue, weakness. No weight gain, no weight loss.  EYES: No blurry or double vision.  ENT: No tinnitus. No postnasal drip. No redness of the oropharynx.  RESPIRATORY: No cough, no wheeze, no hemoptysis. Has dyspnea.  CARDIOVASCULAR: No chest pain. No orthopnea. No palpitations. No syncope.  GASTROINTESTINAL: Decreased nausea, decreased vomiting , no diarrhea. Decreased abdominal pain. No melena or hematochezia.  GENITOURINARY: No dysuria or hematuria.  ENDOCRINE: No polyuria or nocturia. No heat or cold intolerance.  HEMATOLOGY: No anemia. No bruising. No bleeding.  INTEGUMENTARY: No rashes. No lesions.  MUSCULOSKELETAL: No arthritis. No swelling. No gout.  NEUROLOGIC: No numbness, tingling, or ataxia. No seizure-type activity.  PSYCHIATRIC: No anxiety. No insomnia. No ADD.   DRUG ALLERGIES:  No Known Allergies  VITALS:  Blood pressure (!) 125/50, pulse (!) 101, temperature 98.4 F (36.9 C), temperature source Axillary, resp. rate 14, height 4\' 11"  (1.499 m), weight 65.6 kg, SpO2 93 %.  PHYSICAL EXAMINATION:   Physical Exam  GENERAL:  62 y.o.-year-old patient lying in the bed with no acute distress.  EYES: Pupils equal, round, reactive to light and accommodation. No scleral icterus. Extraocular muscles intact.  HEENT: Head atraumatic, normocephalic. Oropharynx dry and nasopharynx clear.  NECK:  Supple, no jugular venous distention. No thyroid enlargement, no tenderness.  LUNGS: Decreased breath sounds bilaterally, bilateral  rhonchi heard. No use of accessory muscles of respiration.  BiPAP settings IPAP 12 EPAP 5 Rate 10 FiO2 30% CARDIOVASCULAR: S1, S2 tachycardia noted. No murmurs, rubs, or gallops.  ABDOMEN: Soft , decreased tenderness  nondistended. Bowel sounds present. No organomegaly or mass.  EXTREMITIES: No cyanosis, clubbing or edema b/l.    NEUROLOGIC: Cranial nerves II through XII are intact. No focal Motor or sensory deficits b/l.   PSYCHIATRIC: The patient is alert and oriented x 3.  SKIN: No obvious rash, lesion, or ulcer.   LABORATORY PANEL:   CBC Recent Labs  Lab 02/17/18 0458  WBC 19.4*  HGB 7.6*  HCT 22.7*  PLT 157   ------------------------------------------------------------------------------------------------------------------ Chemistries  Recent Labs  Lab 02/16/18 2058 02/17/18 0458  NA 138 138  K 3.4* 3.2*  CL 93* 91*  CO2 18* 22  GLUCOSE 181* 177*  BUN 80* 85*  CREATININE 5.10* 5.31*  CALCIUM 6.1* 6.2*  MG 1.7 1.6*  AST 544*  --   ALT 216*  --   ALKPHOS 186*  --   BILITOT 1.6*  --    ------------------------------------------------------------------------------------------------------------------  Cardiac Enzymes No results for input(s): TROPONINI in the last 168 hours. ------------------------------------------------------------------------------------------------------------------  RADIOLOGY:  Ct Abdomen Pelvis Wo Contrast  Result Date: 02/16/2018 CLINICAL DATA:  Unexplained anemia. Recent sepsis. A CT scan from February 14, 2018 demonstrated an abnormal left kidney thought to represent infection of the renal collecting system with a recently passed stone considered less likely. EXAM: CT ABDOMEN AND PELVIS WITHOUT CONTRAST TECHNIQUE: Multidetector CT imaging of the abdomen and pelvis was performed following the standard protocol without IV contrast. COMPARISON:  February 14, 2018 CT scan FINDINGS: Lower chest: Bilateral  pleural effusions are identified,  larger on the left and new on the right. Opacities underlying the effusions are likely atelectasis with infiltrate less likely. Coronary artery calcifications are noted. The lower chest is otherwise unremarkable. Hepatobiliary: The liver is unremarkable. The gallbladder is distended and contains a single stone. The gallbladder was better assessed with an ultrasound from this morning. Pancreas: Unremarkable. No pancreatic ductal dilatation or surrounding inflammatory changes. Spleen: Normal in size without focal abnormality. Adrenals/Urinary Tract: Adrenal glands are normal. No renal stones identified. No hydronephrosis on the right. There is persistent hydronephrosis on the left. There is bilateral perinephric stranding. There is mild left ureterectasis. The right ureter is normal. No ureteral stones. The bladder is decompressed with a Foley catheter but it remains mildly thick walled. There is air in the bladder from the Foley catheter. Stomach/Bowel: The colon is unremarkable.  The appendix is normal. Vascular/Lymphatic: Atherosclerotic changes are seen in the nonaneurysmal aorta. No adenopathy. Reproductive: Uterus and bilateral adnexa are unremarkable. Other: There is increased attenuation diffusely throughout the subcutaneous fat. There is also increased attenuation in the intra-abdominal fat as well as mild ascites extending down the pericolic gutters into the pelvis, new. No free air. Musculoskeletal: No acute or significant osseous findings. IMPRESSION: 1. Left-sided hydronephrosis persists. On the previous study, there was increased perinephric stranding on the left as well compared to the right. I suspect the hydronephrosis on the left is likely acute to subacute although is strictly speaking age indeterminate. I doubt the findings are from a recently passed stone as there has been no significant change since February 14, 2018. An underlying obstruction which is not radiopaque such as a blood clot or  urothelial lesion are possible. Infection of the left renal collecting system is also possible. Recommend clinical correlation and correlation with a urinalysis. 2. The gallbladder is distended with a single stone. If there is concern for acute cholecystitis, recommend HIDA scan. 3. Bilateral pleural effusions, increased attenuation in the subcutaneous fat, and increased attenuation in the intra-abdominal fat all suggest third spacing of fluid/edema. 4. The increased perinephric stranding on the right may be due to the overall increased third-spacing of fluid/edema. No hydronephrosis or stones seen on the right. 5. Opacity under the pleural effusions is likely atelectasis. 6. Coronary artery calcifications and abdominal aorta atherosclerotic change. Electronically Signed   By: Dorise Bullion III M.D   On: 02/16/2018 09:29   Dg Chest 1 View  Result Date: 02/15/2018 CLINICAL DATA:  62 y/o  F; encounter for central line placement. EXAM: CHEST  1 VIEW COMPARISON:  02/15/2018 chest radiograph. FINDINGS: Stable cardiomegaly given projection and technique. Aortic calcific atherosclerosis. Left central venous catheter tip projects over the upper SVC. No pneumothorax. No pleural effusion, consolidation, or pneumothorax. Pulmonary venous hypertension. No acute osseous abnormality is evident. IMPRESSION: 1. Left central venous catheter tip projects over the upper SVC. 2. Cardiomegaly and pulmonary venous hypertension. Electronically Signed   By: Kristine Garbe M.D.   On: 02/15/2018 22:41   US Renal  Result Date: 02/15/2018 CLINICAL DATA:  Renal failure. EXAM: RENAL / URINARY TRACT ULTRASOUND COMPLETE COMPARISON:  CT abdomen and pelvis 02/14/2018 FINDINGS: Right Kidney: Renal measurements: 8.7 x 4.3 x 5.0 cm = volume: 98 mL . Echogenicity within normal limits. No mass or hydronephrosis visualized. Left Kidney: Renal measurements: 13.2 x 6.6 x 6.0 cm = volume: 272 mL. Mild hydronephrosis without mass or  perinephric fluid collection identified. Bladder: Collapsed bladder, poorly evaluated. IMPRESSION: Persistent asymmetric enlargement of the  left kidney with mild left hydronephrosis, similar to yesterday's CT. Electronically Signed   By: Logan Bores M.D.   On: 02/15/2018 14:32   Dg Chest Port 1 View  Result Date: 02/17/2018 CLINICAL DATA:  Initial evaluation for new central line, right side. EXAM: PORTABLE CHEST 1 VIEW COMPARISON:  Prior radiograph from 02/17/2018 FINDINGS: Left IJ approach central venous catheter has been replaced with a new larger caliber line, tip projects over the right atrium. No visible right-sided catheter. Mild cardiomegaly, stable. Mediastinal silhouette within normal limits. Lungs hypoinflated. Perihilar vascular congestion with diffuse interstitial prominence, compatible with mild diffuse pulmonary interstitial edema, similar to previous. Persistent small right pleural effusion. Associated bibasilar opacities favored to reflect atelectasis and/or edema. Infiltrates could be considered in the correct clinical setting. No pneumothorax. Osseous structures unchanged. IMPRESSION: 1. Interval placement of left IJ approach central venous catheter with tip projecting over the right atrium. No visible right-sided catheter. No evidence of pneumothorax. 2. Perihilar vascular congestion with diffuse interstitial prominence, compatible with mild diffuse pulmonary interstitial edema, similar to previous. 3. Small right pleural effusion. Electronically Signed   By: Jeannine Boga M.D.   On: 02/17/2018 06:54   Dg Chest Port 1 View  Result Date: 02/17/2018 CLINICAL DATA:  Pneumonia. EXAM: PORTABLE CHEST 1 VIEW COMPARISON:  Radiograph yesterday. FINDINGS: Left internal jugular central venous catheter tip in the proximal SVC. Low lung volumes with bibasilar opacities and pleural effusions, progressive. Mild cardiomegaly unchanged. Vascular congestion with central pulmonary edema, similar to  prior. No pneumothorax. IMPRESSION: Progressive pleural effusions and bibasilar opacities, likely atelectasis. Unchanged pulmonary edema. Electronically Signed   By: Keith Rake M.D.   On: 02/17/2018 03:46   Dg Chest Port 1 View  Result Date: 02/16/2018 CLINICAL DATA:  Follow-up pneumonia EXAM: PORTABLE CHEST 1 VIEW COMPARISON:  02/15/2018 FINDINGS: Cardiac shadow remains enlarged. Left jugular central line is again seen in the mid superior vena cava. No pneumothorax is noted. Increasing pulmonary vascular congestion with interstitial edema is seen. No focal confluent infiltrate or effusion is noted. IMPRESSION: Increasing changes of CHF. Electronically Signed   By: Inez Catalina M.D.   On: 02/16/2018 07:44   Dg Chest Port 1 View  Result Date: 02/15/2018 CLINICAL DATA:  Acute respiratory failure EXAM: PORTABLE CHEST 1 VIEW COMPARISON:  10/01/2013, CT chest 09/22/2015 FINDINGS: Cardiomegaly. Bibasilar atelectasis. No pleural effusion or pneumothorax. IMPRESSION: Cardiomegaly with bibasilar atelectasis Electronically Signed   By: Donavan Foil M.D.   On: 02/15/2018 17:46   [place] Ekg Site Rite  Result Date: 02/15/2018 If Site Rite image not attached, placement could not be confirmed due to current cardiac rhythm.  US Abdomen Limited Ruq  Result Date: 02/16/2018 CLINICAL DATA:  Elevated LFTs. EXAM: ULTRASOUND ABDOMEN LIMITED RIGHT UPPER QUADRANT COMPARISON:  CT 02/14/2018 FINDINGS: Gallbladder: Distended containing intraluminal sludge and shadowing 1.2 cm gallstone. Mild gallbladder wall thickening of 3.6 mm. No sonographic Murphy sign noted by sonographer. Common bile duct: Diameter: 4 mm, normal. Liver: No focal lesion identified. Within normal limits in parenchymal echogenicity. Portal vein is patent on color Doppler imaging with normal direction of blood flow towards the liver. Right pleural effusion incidentally noted. Trace perihepatic fluid inferiorly. IMPRESSION: 1. Gallbladder distention  containing intraluminal sludge and stone. Borderline mild gallbladder wall thickening. Findings could represent acute cholecystitis in the appropriate clinical setting. Consider nuclear medicine hepatic biliary scan based on clinical concern. 2. No biliary dilatation. No focal hepatic abnormality to explain elevated LFTs. Electronically Signed   By: Threasa Beards  Sanford M.D.   On: 02/16/2018 01:52     ASSESSMENT AND PLAN:   62 year old female patient with with history of hypertension, hyperlipidemia, type 2 diabetes mellitus currently in ICU for sepsis and renal failure   -Septic shock Continue IV fluids and IV pressor medication Close input output monitoring Follow-up cultures  -Sepsis secondary to pyelonephritis  IV fluids and antibiotics Follow-up cultures and lactic acid Continue IV meropenem antibiotic  -E. coli urinary tract infection  -Severe metabolic acidosis secondary to sepsis  -Acute hypoxic respiratory failure secondary to volume overload Continue BiPAP for respiratory distress  -Acute kidney injury and renal failure secondary to ATN from sepsis Has oliguria Nephrology follow-up Hemodialysis today with the intent of gentle ultrafiltration Discontinue bicarbonate infusion IV fluids  -Acute hyperkalemia resolved  -Cholelithiasis with distention of gallbladder Continue IV antibiotics and follow-up LFTs No surgical intervention as of now Consideration for percutaneous cholecystostomy tube drainage as needed  -Leukocytosis secondary to sepsis Continue IV antibiotics  -Anemia Consider PRBC transfusion IV  -Atelectasis and pneumonia Continue IV meropenem antibiotic Zosyn and vancomycin  -Hypocalcemia Replace electrolytes  All the records are reviewed and case discussed with Care Management/Social Worker. Management plans discussed with the patient, family and they are in agreement.  CODE STATUS: Full code  DVT Prophylaxis: SCDs  TOTAL TIME TAKING CARE OF  THIS PATIENT: 34 minutes.   POSSIBLE D/C IN  3 to 4 DAYS, DEPENDING ON CLINICAL CONDITION.  Saundra Shelling M.D on 02/17/2018 at 10:40 AM  Between 7am to 6pm - Pager - [phone]  After 6pm go to [email or web address] - password [email or web address] [facility]  Office  [phone]  CC: Primary care physician; Petra Kuba, MD  Note: This dictation was prepared with Dragon dictation along with smaller phrase technology. Any transcriptional errors that result from this process are unintentional.

## 2018-02-17 NOTE — Progress Notes (Signed)
Pt and son discussed with MD using interpreter, risk and benefits explained. Questions answered by MD, pt and son agree to move ahead with exchanging central line for trialysis HD cath for dialysis. Cath exchanged without complication, pt tolerated well. Consent signed and in chart.

## 2018-02-17 NOTE — Progress Notes (Signed)
Name: Cathy Cline MRN: [id number] DOB: [birth year]     CONSULTATION DATE: 02/14/2018 Subjective & Objectives:  Afebrile, on BiPAP and oliguric PAST MEDICAL HISTORY :   has a past medical history of Carotid stenosis, Diabetes mellitus without complication ([place]), Hypercholesterolemia, Hypertension, Myocardial infarction ([place]), and Tachycardia.  has a past surgical history that includes Cardiac catheterization; Tubal ligation; Knee surgery; and Colonoscopy with propofol (N/A, 10/02/2015). Prior to Admission medications   Medication Sig Start Date End Date Taking? Authorizing Provider  amLODipine (NORVASC) 2.5 MG tablet Take 2.5 mg by mouth daily.   Yes [provider]  aspirin 81 MG tablet Take 81 mg by mouth daily.   Yes [provider]  atorvastatin (LIPITOR) 40 MG tablet Take 40 mg by mouth at bedtime.    Yes [provider]  cetirizine (ZYRTEC) 10 MG tablet Take 10 mg by mouth at bedtime.   Yes [provider]  insulin detemir (LEVEMIR) 100 UNIT/ML injection Inject 20-25 Units into the skin See admin instructions. Inject 25u under the skin every morning and inject 20u under the skin ever evening   Yes [provider]  losartan-hydrochlorothiazide (HYZAAR) 100-25 MG tablet Take 1 tablet by mouth daily.   Yes [provider]  metFORMIN (GLUCOPHAGE-XR) 500 MG 24 hr tablet Take 1,000 mg by mouth 2 (two) times daily.   Yes [provider]  metoprolol tartrate (LOPRESSOR) 25 MG tablet Take 25 mg by mouth 2 (two) times daily.   Yes [provider]   No Known Allergies  FAMILY HISTORY:  family history is not on file. SOCIAL HISTORY:  reports that she has never smoked. She has never used smokeless tobacco. She reports that she does not drink alcohol or use drugs.  REVIEW OF SYSTEMS:   Unable to obtain due to critical illness   VITAL SIGNS: Temp:  [97 F (36.1 C)-98.6 F (37 C)] 98.2 F (36.8 C) (11/29  0400) Pulse Rate:  [94-114] 101 (11/29 0700) Resp:  [14-26] 15 (11/29 0700) BP: (109-169)/(51-69) 126/54 (11/29 0700) SpO2:  [93 %-98 %] 95 % (11/29 0700) FiO2 (%):  [30 %] 30 % (11/29 0148) Weight:  [65.6 kg] 65.6 kg (11/29 0500)  Physical Examination:  Awake and oriented with no acute focal neurological deficits Tolerating BiPAP, no distress, bilateral equal air entry and no adventitious sounds S1 & S2 are audible with no murmur Benign abdominal exam with deep tenderness left hypochondrium No leg edema ASSESSMENT / PLAN: Acute respiratory failure tolerating BiPAP -Monitor work of breathing, ABG and optimize BiPAP settings -Consider intubation if no improvement  Sepsis with septic shock. procalcitonin > 150.00 -ABX + optimize hydration + pressors and monitor hemodynamics  Impending acute respiratory failure with increased work of breathing to compensate for severe metabolic acidosis -Optimize BiPAP to ease off work of breathing -Monitor ABG  AKI with oliguria due to ATN. (worsening) GFR  10 -Optimize hydration, avoid nephrotoxins, monitor renal panel and urine output. -d/c Bicarb. gtt -Follow his renal consult for starting RRT.  Left pyelonephritis (E Coli).  No obstructive uropathy and CT -Start on Rocephin and d/c Meropenem and follow his urine culture  Atelectasis and pneumonia.  worsening bibasilar airspace disease with pulmonary congestion. -Rocephin s/p Mero, Vanc and Cefe -Monitor CXR + CBC + FiO2  Anemia. No retroperitoneal hematoma on CT abd & Pel -Keep Hb > 7 gm/dl.  Questionable acute cholecystitis.  Less likely as per surgical evaluation to be source of sepsis and recommendation for medical management  at this point with consideration for percutaneous cholecystostomy if drainage is needed  Elevated transaminases with acute ischemic hepatitis -Optimize hemodynamics, avoid hepatotoxins and monitor LFTs  Hypocalcemia and hypokalemia -Replete and  monitor electrolytes  Full code  DVT & GI prophylaxis.  Continue with supportive care Family was updated at the bedside  Critical care time 45 minutes

## 2018-02-17 NOTE — Progress Notes (Signed)
Pre HD assessment    02/17/18 1620  Vital Signs  Temp 97.6 F (36.4 C)  Temp Source Axillary  Pulse Rate 100  Pulse Rate Source Monitor  Resp 15  BP (!) 142/59  BP Location Right Arm  BP Method Automatic  Patient Position (if appropriate) Lying  Oxygen Therapy  SpO2 95 %  O2 Device Bi-PAP  Pain Assessment  Pain Scale CPOT  Critical Care Pain Observation Tool (CPOT)  Facial Expression 0  Body Movements 0  Muscle Tension 0  Compliance with ventilator (intubated pts.)  (BiPap)  Vocalization (extubated pts.) 0  Dialysis Weight  Weight 62.6 kg  Type of Weight Pre-Dialysis  Time-Out for Hemodialysis  What Procedure? HD  Pt Identifiers(min of two) First/Last Name;MRN/Account#  Correct Site? Yes  Correct Side? Yes  Correct Procedure? Yes  Consents Verified? Yes  Rad Studies Available? N/A  Safety Precautions Reviewed? Yes  Engineer, civil (consulting) Number  (3A)  Station Number  (Bedside ICU 01)  UF/Alarm Test Passed  Conductivity: Meter 14.6  Conductivity: Machine  14.5  pH 7.4  Reverse Osmosis WRO #3  Normal Saline Lot Number [id number]  Dialyzer Lot Number [id number]  Disposable Set Lot Number [id number]  Machine Temperature 98.6 F (37 C)  Musician and Audible Yes  Blood Lines Intact and Secured Yes  Pre Treatment Patient Checks  Vascular access used during treatment Catheter  Hepatitis B Surface Antigen Results  (unk)  Isolation Initiated Yes  Hepatitis B Surface Antibody  (unk)  Date Hepatitis B Surface Antibody Drawn 02/17/18  Hemodialysis Consent Verified Yes  Hemodialysis Standing Orders Initiated Yes  ECG (Telemetry) Monitor On Yes  Prime Ordered Normal Saline  Length of  DialysisTreatment -hour(s) 2 Hour(s)  Dialyzer Elisio 17H NR  Dialysate 4K  Dialysis Anticoagulant None  Dialysate Flow Ordered 300  Blood Flow Rate Ordered 200 mL/min  Ultrafiltration Goal 1.5 Liters  Pre Treatment Labs Hepatitis B Surface Antigen;Other  (Comment) (HBSAB,HBcore)  Dialysis Blood Pressure Support Ordered Normal Saline  Education / Care Plan  Dialysis Education Provided Yes  Documented Education in Care Plan Yes

## 2018-02-17 NOTE — Progress Notes (Signed)
HD tx start    02/17/18 1647  Vital Signs  Pulse Rate 97  Pulse Rate Source Monitor  Resp 16  BP 139/62  BP Location Right Arm  BP Method Automatic  Patient Position (if appropriate) Lying  Oxygen Therapy  SpO2 95 %  O2 Device Bi-PAP  During Hemodialysis Assessment  Blood Flow Rate (mL/min) 200 mL/min  Arterial Pressure (mmHg) -60 mmHg  Venous Pressure (mmHg) 50 mmHg  Transmembrane Pressure (mmHg) 40 mmHg  Ultrafiltration Rate (mL/min) 1000 mL/min  Dialysate Flow Rate (mL/min) 300 ml/min  Conductivity: Machine  13.8  HD Safety Checks Performed Yes  Dialysis Fluid Bolus Normal Saline  Bolus Amount (mL) 250 mL  Intra-Hemodialysis Comments Tx initiated

## 2018-02-17 NOTE — Progress Notes (Signed)
Post HD assessment    02/17/18 1857  Neurological  Level of Consciousness Alert  Orientation Level Oriented X4  Respiratory  Respiratory Pattern Regular;Unlabored  Chest Assessment Chest expansion symmetrical  Cardiac  ECG Monitor Yes  Cardiac Rhythm NSR  Vascular  R Radial Pulse +2  L Radial Pulse +2  Integumentary  Integumentary (WDL) X  Musculoskeletal  Musculoskeletal (WDL) X  Generalized Weakness Yes  Assistive Device BSC  GU Assessment  Genitourinary (WDL) X  Genitourinary Symptoms  (HD)  Psychosocial  Psychosocial (WDL) WDL  Emotional support given Given to patient;Given to patient's family

## 2018-02-17 NOTE — Procedures (Signed)
Hemodialysis Catheter Insertion Procedure Note Durinda Buzzelli [id number] Jan 29, 1956  Procedure: Insertion of Hemodialysis Catheter Indications: Dialysis Access   Procedure Details Consent: Risks of procedure as well as the alternatives and risks of each were explained to the (patient/caregiver).  Consent for procedure obtained.verbal through translator as the patient speaks spanish only Time Out: Verified patient identification, verified procedure, site/side was marked, verified correct patient position, special equipment/implants available, medications/allergies/relevent history reviewed, required imaging and test results available.  Performed  Maximum sterile technique was used including antiseptics, cap, gloves, gown, hand hygiene, mask and sheet. Skin prep: Chlorhexidine; local anesthetic administered Triple lumen hemodialysis catheter was inserted into left internal jugular vein using the Seldinger technique. Patient had and IJ TLC in place and It was replaced with triple lumen HD catheter on guide wire  Evaluation Blood flow good Complications: No apparent complications Patient did tolerate procedure well. Chest X-ray ordered to verify placement.  CXR: pending.   Sheng Pritz 02/17/2018

## 2018-02-17 NOTE — Progress Notes (Signed)
2030: Spoke with Dr. Jimmy Footman @ [facility] because pt. Is suddenly hypertensive, tachycardic, tanchypneic- requiring increased FiO2 and febrile.  Discussed lab work being sent: BMP, lactic, albumin. MD ordered labetalol PRN and suggested calling nephrology to discuss possible reaction to first time HD treatment.  2040: Paged and spoke with Dr. Candiss Norse- discussed pt.'s status including vitals, recent labs, current FiO2 requirements and my conversation with Dr. Jimmy Footman. Dr. Candiss Norse ordered a one time dose of Benadryl and Solu-medrol Q 6 for 4 doses.  Will continue to monitor pt. Closely.

## 2018-02-17 NOTE — Progress Notes (Signed)
HD tx end    02/17/18 1851  Vital Signs  Pulse Rate 95  Pulse Rate Source Monitor  Resp 16  BP (!) 142/59  BP Location Right Arm  BP Method Automatic  Patient Position (if appropriate) Lying  Oxygen Therapy  SpO2 97 %  O2 Device Bi-PAP  During Hemodialysis Assessment  Dialysis Fluid Bolus Normal Saline  Bolus Amount (mL) 250 mL  Intra-Hemodialysis Comments Tx completed

## 2018-02-17 NOTE — Progress Notes (Signed)
02/17/2018  Subjective: Patient with continued work of breathing, on BiPAP to help with acidosis, and with persistent renal failure and worsening lung exam, starting dialysis today.  Reports that her abdominal discomfort is much improved and only a very minimal discomfort throughout at this point.  Denies any nausea and reports that she was able to eat some yesterday  Vital signs: Temp:  [98.2 F (36.8 C)-99.1 F (37.3 C)] 99.1 F (37.3 C) (11/29 1400) Pulse Rate:  [90-114] 96 (11/29 1400) Resp:  [14-26] 17 (11/29 1400) BP: (109-169)/(50-69) 134/59 (11/29 1400) SpO2:  [91 %-98 %] 92 % (11/29 1400) FiO2 (%):  [30 %-35 %] 30 % (11/29 1000) Weight:  [65.6 kg] 65.6 kg (11/29 0500)   Intake/Output: 11/28 0701 - 11/29 0700 In: 2529.9 [I.V.:2019.9; Blood:360; IV Piggyback:150] Out: 28 [Urine:50] Last BM Date: 02/16/18  Physical Exam: Constitutional: No acute distress Abdomen:  Soft, nondistended, with only mild diffuse discomfort.  Non-peritoneal and negative Murphy's sign.  Labs:  Recent Labs    02/16/18 2058 02/17/18 0458  WBC 26.2* 19.4*  HGB 8.0* 7.6*  HCT 24.1* 22.7*  PLT 172 157   Recent Labs    02/16/18 2058 02/17/18 0458  NA 138 138  K 3.4* 3.2*  CL 93* 91*  CO2 18* 22  GLUCOSE 181* 177*  BUN 80* 85*  CREATININE 5.10* 5.31*  CALCIUM 6.1* 6.2*   Recent Labs    02/15/18 2304  LABPROT 16.6*  INR 1.36    Imaging: Nm Hepatobiliary Liver Func  Result Date: 02/17/2018 CLINICAL DATA:  Sepsis. Gallstones and wall thickening on prior ultrasound. EXAM: NUCLEAR MEDICINE HEPATOBILIARY IMAGING TECHNIQUE: Sequential images of the abdomen were obtained out to 60 minutes following intravenous administration of radiopharmaceutical. RADIOPHARMACEUTICALS:  5.85 mCi Tc-21m  Choletec IV COMPARISON:  CT and ultrasound 02/16/2018 FINDINGS: Prompt uptake and excretion of activity by the liver. Of radiotracer passes into the small bowel compatible with patent common bile duct. No  activity seen in gallbladder at 60 minutes. Therefore, 2 milligrams of morphine was administered IV and imaging was continued. Despite the morphine, gallbladder was not visualized. IMPRESSION: Nonvisualization of the gallbladder despite morphine administration. Findings concerning for cystic duct obstruction. Electronically Signed   By: Rolm Baptise M.D.   On: 02/17/2018 14:18   Dg Chest Port 1 View  Result Date: 02/17/2018 CLINICAL DATA:  Initial evaluation for new central line, right side. EXAM: PORTABLE CHEST 1 VIEW COMPARISON:  Prior radiograph from 02/17/2018 FINDINGS: Left IJ approach central venous catheter has been replaced with a new larger caliber line, tip projects over the right atrium. No visible right-sided catheter. Mild cardiomegaly, stable. Mediastinal silhouette within normal limits. Lungs hypoinflated. Perihilar vascular congestion with diffuse interstitial prominence, compatible with mild diffuse pulmonary interstitial edema, similar to previous. Persistent small right pleural effusion. Associated bibasilar opacities favored to reflect atelectasis and/or edema. Infiltrates could be considered in the correct clinical setting. No pneumothorax. Osseous structures unchanged. IMPRESSION: 1. Interval placement of left IJ approach central venous catheter with tip projecting over the right atrium. No visible right-sided catheter. No evidence of pneumothorax. 2. Perihilar vascular congestion with diffuse interstitial prominence, compatible with mild diffuse pulmonary interstitial edema, similar to previous. 3. Small right pleural effusion. Electronically Signed   By: Jeannine Boga M.D.   On: 02/17/2018 06:54   Dg Chest Port 1 View  Result Date: 02/17/2018 CLINICAL DATA:  Pneumonia. EXAM: PORTABLE CHEST 1 VIEW COMPARISON:  Radiograph yesterday. FINDINGS: Left internal jugular central venous catheter tip in  the proximal SVC. Low lung volumes with bibasilar opacities and pleural effusions,  progressive. Mild cardiomegaly unchanged. Vascular congestion with central pulmonary edema, similar to prior. No pneumothorax. IMPRESSION: Progressive pleural effusions and bibasilar opacities, likely atelectasis. Unchanged pulmonary edema. Electronically Signed   By: Keith Rake M.D.   On: 02/17/2018 03:46    Assessment/Plan: This is a 62 y.o. female with pyelonephritis and sepsis.  --Although clinically she does not appear to have cholecystitis as her pain is minimal and not localized to the right upper quadrant, her HIDA scan at this point shows non-visualization of the gallbladder consistent with cholecystitis.  It may be that she's improving as she's getting treated with IV antibiotics. --Despite of a positive HIDA scan, clinically she's better, so would not recommend percutaneous cholecystostomy tube at this point.  However, it would be required if her abdominal pain or clinical condition worsens.  Given her sepsis, renal failure, and respiratory worsening, no surgery is recommended at this point. --will continue following along.   Melvyn Neth, [facility]

## 2018-02-17 NOTE — Progress Notes (Signed)
Post HD assessment. Pt tolerated tx well without c/o or complication. Net UF 1501, goal met.    02/17/18 1859  Vital Signs  Temp 99.2 F (37.3 C)  Temp Source Axillary  Pulse Rate 94  Pulse Rate Source Monitor  Resp 13  BP (!) 156/62  BP Location Right Arm  BP Method Automatic  Patient Position (if appropriate) Lying  Oxygen Therapy  SpO2 97 %  O2 Device Bi-PAP  Dialysis Weight  Weight 61.3 kg  Type of Weight Post-Dialysis  Post-Hemodialysis Assessment  Rinseback Volume (mL) 250 mL  KECN 23.7 V  Dialyzer Clearance Lightly streaked  Duration of HD Treatment -hour(s) 2 hour(s)  Hemodialysis Intake (mL) 500 mL  UF Total -Machine (mL) 2001 mL  Net UF (mL) 1501 mL  Tolerated HD Treatment Yes  Education / Care Plan  Dialysis Education Provided Yes  Documented Education in Care Plan Yes  Hemodialysis Catheter Left Internal jugular Triple-lumen  Placement Date/Time: 02/17/18 0630   Placed prior to admission: No  Time Out: Correct patient  Person Inserting Catheter: Dr. Soyla Murphy  Orientation: Left  Access Location: Internal jugular  Hemodialysis Catheter Type: Triple-lumen  Site Condition No complications  Blue Lumen Status Heparin locked  Red Lumen Status Heparin locked  Purple Lumen Status N/A  Catheter fill solution Heparin 1000 units/ml  Catheter fill volume (Arterial) 1.4 cc  Catheter fill volume (Venous) 1.4  Dressing Type Biopatch  Dressing Status Clean;Dry;Intact  Drainage Description None  Post treatment catheter status Capped and Clamped

## 2018-02-18 ENCOUNTER — Inpatient Hospital Stay: Payer: 59

## 2018-02-18 DIAGNOSIS — J9601 Acute respiratory failure with hypoxia: Secondary | ICD-10-CM

## 2018-02-18 LAB — CBC WITH DIFFERENTIAL/PLATELET
Abs Immature Granulocytes: 0.18 10*3/uL — ABNORMAL HIGH (ref 0.00–0.07)
BASOS ABS: 0.1 10*3/uL (ref 0.0–0.1)
Basophils Relative: 0 %
Eosinophils Absolute: 0 10*3/uL (ref 0.0–0.5)
Eosinophils Relative: 0 %
HEMATOCRIT: 26.8 % — AB (ref 36.0–46.0)
Hemoglobin: 8.9 g/dL — ABNORMAL LOW (ref 12.0–15.0)
Immature Granulocytes: 1 %
Lymphocytes Relative: 2 %
Lymphs Abs: 0.4 10*3/uL — ABNORMAL LOW (ref 0.7–4.0)
MCH: 26.9 pg (ref 26.0–34.0)
MCHC: 33.2 g/dL (ref 30.0–36.0)
MCV: 81 fL (ref 80.0–100.0)
Monocytes Absolute: 0.4 10*3/uL (ref 0.1–1.0)
Monocytes Relative: 2 %
Neutro Abs: 17.9 10*3/uL — ABNORMAL HIGH (ref 1.7–7.7)
Neutrophils Relative %: 95 %
Platelets: 119 10*3/uL — ABNORMAL LOW (ref 150–400)
RBC: 3.31 MIL/uL — AB (ref 3.87–5.11)
RDW: 19.3 % — ABNORMAL HIGH (ref 11.5–15.5)
WBC: 19 10*3/uL — AB (ref 4.0–10.5)
nRBC: 0 % (ref 0.0–0.2)

## 2018-02-18 LAB — BPAM RBC
Blood Product Expiration Date: 201912152359
Blood Product Expiration Date: 201912152359
ISSUE DATE / TIME: 201911281028
Unit Type and Rh: 6200
Unit Type and Rh: 6200

## 2018-02-18 LAB — GLUCOSE, CAPILLARY
Glucose-Capillary: 218 mg/dL — ABNORMAL HIGH (ref 70–99)
Glucose-Capillary: 184 mg/dL — ABNORMAL HIGH (ref 70–99)
Glucose-Capillary: 299 mg/dL — ABNORMAL HIGH (ref 70–99)
Glucose-Capillary: 322 mg/dL — ABNORMAL HIGH (ref 70–99)
Glucose-Capillary: 306 mg/dL — ABNORMAL HIGH (ref 70–99)

## 2018-02-18 LAB — MAGNESIUM
Magnesium: 2.1 mg/dL (ref 1.7–2.4)
Magnesium: 2 mg/dL (ref 1.7–2.4)

## 2018-02-18 LAB — BASIC METABOLIC PANEL
Anion gap: 23 — ABNORMAL HIGH (ref 5–15)
Anion gap: 16 — ABNORMAL HIGH (ref 5–15)
Anion gap: 15 (ref 5–15)
BUN: 88 mg/dL — ABNORMAL HIGH (ref 8–23)
BUN: 73 mg/dL — ABNORMAL HIGH (ref 8–23)
BUN: 46 mg/dL — ABNORMAL HIGH (ref 8–23)
CHLORIDE: 94 mmol/L — AB (ref 98–111)
CO2: 25 mmol/L (ref 22–32)
CO2: 29 mmol/L (ref 22–32)
CO2: 29 mmol/L (ref 22–32)
CREATININE: 3.12 mg/dL — AB (ref 0.44–1.00)
Calcium: 6.5 mg/dL — ABNORMAL LOW (ref 8.9–10.3)
Calcium: 7.4 mg/dL — ABNORMAL LOW (ref 8.9–10.3)
Calcium: 7.9 mg/dL — ABNORMAL LOW (ref 8.9–10.3)
Chloride: 90 mmol/L — ABNORMAL LOW (ref 98–111)
Chloride: 93 mmol/L — ABNORMAL LOW (ref 98–111)
Creatinine, Ser: 5.41 mg/dL — ABNORMAL HIGH (ref 0.44–1.00)
Creatinine, Ser: 4.2 mg/dL — ABNORMAL HIGH (ref 0.44–1.00)
GFR calc Af Amer: 9 mL/min — ABNORMAL LOW (ref >60)
GFR calc Af Amer: 12 mL/min — ABNORMAL LOW (ref >60)
GFR calc Af Amer: 18 mL/min — ABNORMAL LOW (ref >60)
GFR calc non Af Amer: 8 mL/min — ABNORMAL LOW (ref >60)
GFR calc non Af Amer: 11 mL/min — ABNORMAL LOW (ref >60)
GFR calc non Af Amer: 15 mL/min — ABNORMAL LOW (ref >60)
GLUCOSE: 216 mg/dL — AB (ref 70–99)
Glucose, Bld: 206 mg/dL — ABNORMAL HIGH (ref 70–99)
Glucose, Bld: 335 mg/dL — ABNORMAL HIGH (ref 70–99)
POTASSIUM: 3.9 mmol/L (ref 3.5–5.1)
Potassium: 3.1 mmol/L — ABNORMAL LOW (ref 3.5–5.1)
Potassium: 3.7 mmol/L (ref 3.5–5.1)
Sodium: 138 mmol/L (ref 135–145)
Sodium: 139 mmol/L (ref 135–145)
Sodium: 137 mmol/L (ref 135–145)

## 2018-02-18 LAB — BLOOD GAS, ARTERIAL
ACID-BASE EXCESS: 3.2 mmol/L — AB (ref 0.0–2.0)
Bicarbonate: 26.9 mmol/L (ref 20.0–28.0)
DELIVERY SYSTEMS: POSITIVE
Expiratory PAP: 5
FIO2: 0.4
Inspiratory PAP: 12
O2 Saturation: 96.8 %
Patient temperature: 37
pCO2 arterial: 37 mmHg (ref 32.0–48.0)
pH, Arterial: 7.47 — ABNORMAL HIGH (ref 7.350–7.450)
pO2, Arterial: 83 mmHg (ref 83.0–108.0)

## 2018-02-18 LAB — PHOSPHORUS
PHOSPHORUS: 4.2 mg/dL (ref 2.5–4.6)
Phosphorus: 6.5 mg/dL — ABNORMAL HIGH (ref 2.5–4.6)

## 2018-02-18 LAB — TYPE AND SCREEN
ABO/RH(D): A POS
Antibody Screen: NEGATIVE
Unit division: 0
Unit division: 0

## 2018-02-18 LAB — HEPATITIS B SURFACE ANTIBODY,QUALITATIVE: Hep B S Ab: NONREACTIVE

## 2018-02-18 LAB — HEPATITIS B CORE ANTIBODY, TOTAL: Hep B Core Total Ab: NEGATIVE

## 2018-02-18 LAB — PREPARE RBC (CROSSMATCH)

## 2018-02-18 LAB — CALCIUM, IONIZED: Calcium, Ionized, Serum: 3.2 mg/dL — ABNORMAL LOW (ref 4.5–5.6)

## 2018-02-18 LAB — HEPATITIS B SURFACE ANTIGEN: Hepatitis B Surface Ag: NEGATIVE

## 2018-02-18 MED ORDER — DIPHENHYDRAMINE HCL 50 MG/ML IJ SOLN
12.5000 mg | Freq: Once | INTRAMUSCULAR | Status: AC
  Administered 2018-02-18: 12.5 mg via INTRAVENOUS
  Filled 2018-02-18: qty 1

## 2018-02-18 MED ORDER — INSULIN REGULAR HUMAN 100 UNIT/ML IJ SOLN
2.0000 [IU] | Freq: Once | INTRAMUSCULAR | Status: AC
  Administered 2018-02-18: 2 [IU] via INTRAVENOUS
  Filled 2018-02-18: qty 10

## 2018-02-18 MED ORDER — INSULIN REGULAR(HUMAN) IN NACL 100-0.9 UT/100ML-% IV SOLN
INTRAVENOUS | Status: DC
  Administered 2018-02-19: 2.5 [IU]/h via INTRAVENOUS
  Filled 2018-02-18: qty 100

## 2018-02-18 MED ORDER — INSULIN ASPART 100 UNIT/ML ~~LOC~~ SOLN: 2.0000 [IU] | SUBCUTANEOUS | Status: DC

## 2018-02-18 MED ORDER — INSULIN REGULAR BOLUS VIA INFUSION: 2.0000 [IU] | Freq: Once | INTRAVENOUS | Status: DC

## 2018-02-18 NOTE — Progress Notes (Signed)
[facility] Progress Note Patient Name: Cathy Cline DOB: 1956/03/16 MRN: [id number]   Date of Service  02/18/2018  HPI/Events of Note  Marked hyperglycemia on sensitive insulin coverage scale. Patient is on steroids. Pt had ESRD.  eICU Interventions  Regular insulin 2 units iv x 1, change insulin coverage scale from sensitive to standard. POC blood sugar Q I hr x 4        B[place] [facility] Davyon Fisch 02/18/2018, 10:06 PM

## 2018-02-18 NOTE — Progress Notes (Signed)
Name: Cathy Cline MRN: [id number] DOB: 06-16-1955     CONSULTATION DATE: 02/14/2018 Subjective & objectives: Tolerating nasal cannula, afebrile.  Developed drug reactions after dialysis last night.  Off bicarb drip PAST MEDICAL HISTORY :   has a past medical history of Carotid stenosis, Diabetes mellitus without complication ([place]), Hypercholesterolemia, Hypertension, Myocardial infarction ([place]), and Tachycardia.  has a past surgical history that includes Cardiac catheterization; Tubal ligation; Knee surgery; and Colonoscopy with propofol (N/A, 10/02/2015). Prior to Admission medications   Medication Sig Start Date End Date Taking? Authorizing Provider  amLODipine (NORVASC) 2.5 MG tablet Take 2.5 mg by mouth daily.   Yes [provider]  aspirin 81 MG tablet Take 81 mg by mouth daily.   Yes [provider]  atorvastatin (LIPITOR) 40 MG tablet Take 40 mg by mouth at bedtime.    Yes [provider]  cetirizine (ZYRTEC) 10 MG tablet Take 10 mg by mouth at bedtime.   Yes [provider]  insulin detemir (LEVEMIR) 100 UNIT/ML injection Inject 20-25 Units into the skin See admin instructions. Inject 25u under the skin every morning and inject 20u under the skin ever evening   Yes [provider]  losartan-hydrochlorothiazide (HYZAAR) 100-25 MG tablet Take 1 tablet by mouth daily.   Yes [provider]  metFORMIN (GLUCOPHAGE-XR) 500 MG 24 hr tablet Take 1,000 mg by mouth 2 (two) times daily.   Yes [provider]  metoprolol tartrate (LOPRESSOR) 25 MG tablet Take 25 mg by mouth 2 (two) times daily.   Yes [provider]   No Known Allergies  FAMILY HISTORY:  family history is not on file. SOCIAL HISTORY:  reports that she has never smoked. She has never used smokeless tobacco. She reports that she does not drink alcohol or use drugs.  REVIEW OF SYSTEMS:   Unable to obtain due to critical illness   VITAL  SIGNS: Temp:  [97.6 F (36.4 C)-103.1 F (39.5 C)] 97.7 F (36.5 C) (11/30 1247) Pulse Rate:  [67-137] 97 (11/30 1515) Resp:  [11-28] 21 (11/30 1515) BP: (110-216)/(53-116) 139/64 (11/30 1515) SpO2:  [87 %-100 %] 95 % (11/30 1515) FiO2 (%):  [30 %-80 %] 40 % (11/30 0400) Weight:  [59.1 kg-62.6 kg] 59.2 kg (11/30 1247)  Physical Examination: Awake and oriented with no acute focal neurological deficits Tolerating [place] 4 L/min, no distress, bilateral equal air entry and no adventitious sounds S1 & S2 are audible with no murmur Benign abdominal exam with deep tenderness left hypochondrium No leg edema  ASSESSMENT / PLAN: Acute respiratory failure, improved and tolerating [place] 4 L/min -Monitor work of breathing and O2 sat.  Sepsis with septic shock. procalcitonin > 150.00. improved -ABX + optimize hydration = Off pressors and monitor hemodynamics  *AKI with oliguria due to ATN. (worsening) GFR  10 *Questionable allergic reaction to dialyzer -Optimize hydration, avoid nephrotoxins, monitor renal panel and urine output. -d/c Bicarb. gtt -On HD as per renal -A per renal plan to premedicate with Benadryl and Solumedrol.  Left pyelonephritis (E Coli). No obstructive uropathy and CT -Start on Rocephin and d/c Meropenem and follow his urine culture  Atelectasis and pneumonia. unchanged bibasilar airspace disease with pulmonary congestion and pl. eff -Rocephin s/p merop, Vanc , Cefep -Monitor CXR + CBC + FiO2  Anemia. No retroperitoneal hematoma on CT abd & Pel -Keep Hb > 7 gm/dl.  Questionable acute cholecystitis. Gall bladder is not visualized on HIDA scan.  -Management as per surgery and plan for  conservative management with consideration for percutaneous cholecystostomy if worsening clinical picture.  Elevated transaminases with acute ischemic hepatitis -Optimize hemodynamics, avoid hepatotoxins and monitor LFTs  Thrombocytopenia -Monitor Plat  Full code  DVT  &GI prophylaxis. Continue with supportive care Family was updated at the bedside  Critical care time 45 minutes

## 2018-02-18 NOTE — Plan of Care (Signed)
Pt. Had a prolonged episode btwn 2000 and 2045 of worsening tachycardia, tachypnea, HTN and increased FiO2 requirements with a fever of 103.  HR and BP began trending down post Benadryl and Solu-medrol treatment.  Labetalol and tylenol PRN also given with positive results.  No other episodes throughout evening. Pt. Resting comfortably on Bipap @ 40%, A&O x4, VSS. UOP poor @ 84 mL for the shift. Report given to Hiral

## 2018-02-18 NOTE — Progress Notes (Signed)
Post HD assessment. Pt tolerated tx well without c/o or complication. Net UF 1502, goal met.    02/18/18 1247  Vital Signs  Temp 97.7 F (36.5 C)  Temp Source Oral  Pulse Rate 78  Pulse Rate Source Monitor  Resp 14  BP 138/70  BP Location Right Arm  BP Method Automatic  Patient Position (if appropriate) Lying  Oxygen Therapy  SpO2 99 %  O2 Device Nasal Cannula  O2 Flow Rate (L/min) 4 L/min  Dialysis Weight  Weight 59.2 kg  Type of Weight Post-Dialysis  Post-Hemodialysis Assessment  Rinseback Volume (mL) 250 mL  KECN 34.1 V  Dialyzer Clearance Lightly streaked  Duration of HD Treatment -hour(s) 2.5 hour(s)  Hemodialysis Intake (mL) 500 mL  UF Total -Machine (mL) 2002 mL  Net UF (mL) 1502 mL  Tolerated HD Treatment Yes  Education / Care Plan  Dialysis Education Provided Yes  Documented Education in Care Plan Yes  Hemodialysis Catheter Left Internal jugular Triple-lumen  Placement Date/Time: 02/17/18 0630   Placed prior to admission: No  Time Out: Correct patient  Person Inserting Catheter: Dr. Soyla Murphy  Orientation: Left  Access Location: Internal jugular  Hemodialysis Catheter Type: Triple-lumen  Site Condition No complications  Blue Lumen Status Heparin locked  Red Lumen Status Heparin locked  Purple Lumen Status N/A  Catheter fill solution Heparin 1000 units/ml  Catheter fill volume (Arterial) 1.4 cc  Catheter fill volume (Venous) 1.4  Dressing Type Biopatch  Dressing Status Clean;Dry;Intact  Drainage Description None  Post treatment catheter status Capped and Clamped

## 2018-02-18 NOTE — Progress Notes (Signed)
02/18/2018  Subjective: Patient started dialysis yesterday.  Today she's doing better from respiratory standpoint and is off Bipap and onto nasal cannula. She denies any significant abdominal pain.  HIDA scan yesterday was positive for cystic duct obstruction.  Vital signs: Temp:  [97.6 F (36.4 C)-103.1 F (39.5 C)] 97.7 F (36.5 C) (11/30 1247) Pulse Rate:  [67-137] 97 (11/30 1515) Resp:  [11-28] 21 (11/30 1515) BP: (110-216)/(53-116) 139/64 (11/30 1515) SpO2:  [87 %-100 %] 95 % (11/30 1515) FiO2 (%):  [30 %-80 %] 40 % (11/30 0400) Weight:  [59.1 kg-62.6 kg] 59.2 kg (11/30 1247)   Intake/Output: 11/29 0701 - 11/30 0700 In: 512.8 [I.V.:503.7; IV Piggyback:9.1] Out: 1710 [Urine:209] Last BM Date: 02/17/18  Physical Exam: Constitutional: No acute distress Abdomen:  Soft, nondistended, with minimal discomfort in the right upper quadrant, though it's worse in left upper and lower pelvis.  Labs:  Recent Labs    02/17/18 0458 02/18/18 0354  WBC 19.4* 19.0*  HGB 7.6* 8.9*  HCT 22.7* 26.8*  PLT 157 119*   Recent Labs    02/17/18 2028 02/18/18 0354  NA 139 139  K 4.4 3.7  CL 91* 94*  CO2 28 29  GLUCOSE 193* 216*  BUN 59* 73*  CREATININE 4.12* 4.20*  CALCIUM 7.6* 7.4*   Recent Labs    02/15/18 2304  LABPROT 16.6*  INR 1.36    Imaging: Dg Chest Port 1 View  Result Date: 02/18/2018 CLINICAL DATA:  Pneumonia. EXAM: PORTABLE CHEST 1 VIEW COMPARISON:  02/17/2018 FINDINGS: Stable position of left IJ approach central venous catheter with tip over the expected location of the right atrium. Cardiomediastinal silhouette is normal. Mediastinal contours appear intact. There is no evidence of pneumothorax. Mixed pattern pulmonary edema. Possible small bilateral pleural effusions. Osseous structures are without acute abnormality. Soft tissues are grossly normal. IMPRESSION: Mixed pattern pulmonary edema. Possible bilateral small pleural effusions. Electronically Signed   By:  Fidela Salisbury M.D.   On: 02/18/2018 07:32    Assessment/Plan: This is a 62 y.o. female with cholecystitis on HIDA scan, admitted for pyelonephritis.  --clinically, the patient is doing well from gallbladder standpoint.  Would not rush to place cholecystostomy tube unless there is any deterioration such as worsening pain, pain with po intake, worsening WBC. --may have a low fat diet --continue IV antibiotics.  Consider stronger/broader abx given such elevated WBC. --will continue to follow along.   Melvyn Neth, [facility]

## 2018-02-18 NOTE — Progress Notes (Signed)
Post HD assessment    02/18/18 1246  Neurological  Level of Consciousness Alert  Orientation Level Oriented X4  Respiratory  Respiratory Pattern Regular  Chest Assessment Chest expansion symmetrical  Cardiac  ECG Monitor Yes  Cardiac Rhythm NSR  Vascular  R Radial Pulse +2  L Radial Pulse +2  Edema Generalized;Right upper extremity;Left upper extremity;Right lower extremity;Left lower extremity  Integumentary  Integumentary (WDL) X  Skin Color Appropriate for ethnicity  Musculoskeletal  Musculoskeletal (WDL) X  Generalized Weakness Yes  Assistive Device BSC  GU Assessment  Genitourinary (WDL) X  Genitourinary Symptoms  (HD)  Psychosocial  Psychosocial (WDL) WDL  Emotional support given Given to patient;Given to patient's family

## 2018-02-18 NOTE — Progress Notes (Signed)
Pre HD assessment    02/18/18 0946  Neurological  Level of Consciousness Alert  Orientation Level Oriented X4  Respiratory  Respiratory Pattern Regular  Chest Assessment Chest expansion symmetrical  Cardiac  ECG Monitor Yes  Cardiac Rhythm NSR  Vascular  R Radial Pulse +2  L Radial Pulse +2  Edema Generalized;Right upper extremity;Left upper extremity;Right lower extremity;Left lower extremity  Integumentary  Integumentary (WDL) X  Skin Color Appropriate for ethnicity  Musculoskeletal  Musculoskeletal (WDL) X  Generalized Weakness Yes  Assistive Device BSC  GU Assessment  Genitourinary (WDL) X  Genitourinary Symptoms  (HD)  Psychosocial  Psychosocial (WDL) WDL  Patient Behaviors Appropriate for situation;Cooperative;Anxious  Needs Expressed Denies  Psychosocial Additional Assessments Family behavior  Family Behavior Supportive;Calm;Cooperative  Emotional support given Given to patient;Given to patient's family

## 2018-02-18 NOTE — Progress Notes (Signed)
[place], [place] 02/18/18  Subjective:   Conversation through physical Spanish interpreter Patient remains critically ill. UOP 200 last 24 hours Patient is now off of noninvasive positive pressure ventilation therapy Post dialysis yesterday, patient had an episode of tachycardia, increased blood pressure and tachypnea.  She was febrile and required higher FiO2 despite 1500 cc of fluid being removed This morning her breathing is back to normal.  She is on oxygen by nasal cannula.  States she ate some without nausea vomiting.  Objective:  Vital signs in last 24 hours:  Temp:  [97.6 F (36.4 C)-103.1 F (39.5 C)] 97.8 F (36.6 C) (11/30 0800) Pulse Rate:  [72-137] 87 (11/30 0800) Resp:  [11-28] 17 (11/30 0800) BP: (110-216)/(50-116) 148/70 (11/30 0800) SpO2:  [87 %-98 %] 95 % (11/30 0844) FiO2 (%):  [30 %-80 %] 40 % (11/30 0400) Weight:  [59.1 kg-62.6 kg] 59.1 kg (11/30 0500)  Weight change: -3 kg Filed Weights   02/17/18 1620 02/17/18 1859 02/18/18 0500  Weight: 62.6 kg 61.3 kg 59.1 kg    Intake/Output:    Intake/Output Summary (Last 24 hours) at 02/18/2018 0947 Last data filed at 02/18/2018 0600 Gross per 24 hour  Intake 127.64 ml  Output 1635 ml  Net -1507.36 ml     Physical Exam: General:  Critically ill-appearing  HEENT  dry mouth  Neck  supple  Pulm/lungs  mild basilar crackles,    CVS/Heart  regular, + S3 gallop  Abdomen:   Mild, diffuse tenderness, soft  Extremities:  no edema  Neurologic:  Alert, able to interact and answer questions  Skin:  Normal turgor, no acute rashes    Foley in place       Basic Metabolic Panel:  Recent Labs  Lab 02/16/18 1500 02/16/18 2058 02/17/18 0458 02/17/18 1429 02/17/18 2028 02/18/18 0354 02/18/18 0845  NA 138 138 138 138 139 139  --   K 3.4* 3.4* 3.2* 3.1* 4.4 3.7  --   CL 93* 93* 91* 90* 91* 94*  --   CO2 18* 18* 22 25 28 29   --   GLUCOSE 178* 181* 177* 206* 193* 216*  --    BUN 78* 80* 85* 88* 59* 73*  --   CREATININE 5.10* 5.10* 5.31* 5.41* 4.12* 4.20*  --   CALCIUM 6.1* 6.1* 6.2* 6.5* 7.6* 7.4*  --   MG 1.7 1.7 1.6*  --  2.0  --  2.1  PHOS 6.6* 6.6* 6.8*  --  5.0*  --  6.5*     CBC: Recent Labs  Lab 02/15/18 1720 02/16/18 0353 02/16/18 1200 02/16/18 1848 02/16/18 2058 02/17/18 0458 02/18/18 0354  WBC 15.9* 27.7*  --   --  26.2* 19.4* 19.0*  NEUTROABS 14.8*  --   --   --  23.7* 17.8* 17.9*  HGB 8.2* 6.3* 7.9* 7.9* 8.0* 7.6* 8.9*  HCT 27.5* 20.1* 23.9* 24.1* 24.1* 22.7* 26.8*  MCV 97.5 89.7  --   --  81.7 80.8 81.0  PLT 258 197  --   --  172 157 119*      Lab Results  Component Value Date   HEPBSAG Negative 02/17/2018   HEPBSAB Non Reactive 02/17/2018      Microbiology:  Recent Results (from the past 240 hour(s))  Urine culture     Status: Abnormal   Collection Time: 02/14/18  6:44 PM  Result Value Ref Range Status   Specimen Description   Final    URINE, RANDOM Performed  at [facility], [address] [place], [place] [address]    Special Requests   Final    NONE Performed at [facility], [address] [place], [place] [address]    Culture >=100,000 COLONIES/mL ESCHERICHIA COLI (A)  Final   Report Status 02/17/2018 FINAL  Final   Organism ID, Bacteria ESCHERICHIA COLI (A)  Final      Susceptibility   Escherichia coli - MIC*    AMPICILLIN <=2 SENSITIVE Sensitive     CEFAZOLIN <=4 SENSITIVE Sensitive     CEFTRIAXONE <=1 SENSITIVE Sensitive     CIPROFLOXACIN <=0.25 SENSITIVE Sensitive     GENTAMICIN <=1 SENSITIVE Sensitive     IMIPENEM <=0.25 SENSITIVE Sensitive     NITROFURANTOIN <=16 SENSITIVE Sensitive     TRIMETH/SULFA <=20 SENSITIVE Sensitive     AMPICILLIN/SULBACTAM <=2 SENSITIVE Sensitive     PIP/TAZO <=4 SENSITIVE Sensitive     Extended ESBL NEGATIVE Sensitive     * >=100,000 COLONIES/mL ESCHERICHIA COLI  MRSA PCR Screening     Status: None   Collection Time: 02/15/18  5:13 PM  Result  Value Ref Range Status   MRSA by PCR NEGATIVE NEGATIVE Final    Comment:        The GeneXpert MRSA Assay (FDA approved for NASAL specimens only), is one component of a comprehensive MRSA colonization surveillance program. It is not intended to diagnose MRSA infection nor to guide or monitor treatment for MRSA infections. Performed at [facility], [address] [place], [place] [address]   CULTURE, BLOOD (ROUTINE X 2) w Reflex to ID Panel     Status: None (Preliminary result)   Collection Time: 02/15/18  7:10 PM  Result Value Ref Range Status   Specimen Description BLOOD LEFT ANTECUBITAL  Final   Special Requests   Final    BOTTLES DRAWN AEROBIC AND ANAEROBIC Blood Culture adequate volume   Culture   Final    NO GROWTH 3 DAYS Performed at [facility], [address] [place], [place] [address]    Report Status PENDING  Incomplete  CULTURE, BLOOD (ROUTINE X 2) w Reflex to ID Panel     Status: None (Preliminary result)   Collection Time: 02/15/18  7:25 PM  Result Value Ref Range Status   Specimen Description BLOOD BLOOD RIGHT HAND  Final   Special Requests   Final    BOTTLES DRAWN AEROBIC AND ANAEROBIC Blood Culture adequate volume   Culture   Final    NO GROWTH 3 DAYS Performed at [facility], [address] [place], [place] [address]    Report Status PENDING  Incomplete  Urine Culture     Status: None   Collection Time: 02/15/18  8:10 PM  Result Value Ref Range Status   Specimen Description   Final    URINE, CATHETERIZED Performed at [facility], [address], [place], [place] [address]    Special Requests   Final    NONE Performed at [facility], [address], [place], [place] [address]    Culture   Final    NO GROWTH Performed at [facility], [place] [address], [place], [place] [address]    Report Status 02/17/2018 FINAL  Final  C difficile quick scan w PCR reflex     Status: None   Collection  Time: 02/16/18  5:20 AM  Result Value Ref Range Status   C Diff antigen NEGATIVE NEGATIVE Final   C  Diff toxin NEGATIVE NEGATIVE Final   C Diff interpretation No C. difficile detected.  Final    Comment: Performed at [facility], [address] [place], [place] [address]    Coagulation Studies: Recent Labs    02/15/18 2304  LABPROT 16.6*  INR 1.36    Urinalysis: Recent Labs    02/15/18 2010  COLORURINE YELLOW*  LABSPEC 1.011  PHURINE 5.0  GLUCOSEU NEGATIVE  HGBUR LARGE*  BILIRUBINUR NEGATIVE  KETONESUR NEGATIVE  PROTEINUR 100*  NITRITE NEGATIVE  LEUKOCYTESUR MODERATE*      Imaging: Nm Hepatobiliary Liver Func  Result Date: 02/17/2018 CLINICAL DATA:  Sepsis. Gallstones and wall thickening on prior ultrasound. EXAM: NUCLEAR MEDICINE HEPATOBILIARY IMAGING TECHNIQUE: Sequential images of the abdomen were obtained out to 60 minutes following intravenous administration of radiopharmaceutical. RADIOPHARMACEUTICALS:  5.85 mCi Tc-47m  Choletec IV COMPARISON:  CT and ultrasound 02/16/2018 FINDINGS: Prompt uptake and excretion of activity by the liver. Of radiotracer passes into the small bowel compatible with patent common bile duct. No activity seen in gallbladder at 60 minutes. Therefore, 2 milligrams of morphine was administered IV and imaging was continued. Despite the morphine, gallbladder was not visualized. IMPRESSION: Nonvisualization of the gallbladder despite morphine administration. Findings concerning for cystic duct obstruction. Electronically Signed   By: Rolm Baptise M.D.   On: 02/17/2018 14:18   Dg Chest Port 1 View  Result Date: 02/18/2018 CLINICAL DATA:  Pneumonia. EXAM: PORTABLE CHEST 1 VIEW COMPARISON:  02/17/2018 FINDINGS: Stable position of left IJ approach central venous catheter with tip over the expected location of the right atrium. Cardiomediastinal silhouette is normal. Mediastinal contours appear intact. There is no evidence of pneumothorax.  Mixed pattern pulmonary edema. Possible small bilateral pleural effusions. Osseous structures are without acute abnormality. Soft tissues are grossly normal. IMPRESSION: Mixed pattern pulmonary edema. Possible bilateral small pleural effusions. Electronically Signed   By: Fidela Salisbury M.D.   On: 02/18/2018 07:32   Dg Chest Port 1 View  Result Date: 02/17/2018 CLINICAL DATA:  Initial evaluation for new central line, right side. EXAM: PORTABLE CHEST 1 VIEW COMPARISON:  Prior radiograph from 02/17/2018 FINDINGS: Left IJ approach central venous catheter has been replaced with a new larger caliber line, tip projects over the right atrium. No visible right-sided catheter. Mild cardiomegaly, stable. Mediastinal silhouette within normal limits. Lungs hypoinflated. Perihilar vascular congestion with diffuse interstitial prominence, compatible with mild diffuse pulmonary interstitial edema, similar to previous. Persistent small right pleural effusion. Associated bibasilar opacities favored to reflect atelectasis and/or edema. Infiltrates could be considered in the correct clinical setting. No pneumothorax. Osseous structures unchanged. IMPRESSION: 1. Interval placement of left IJ approach central venous catheter with tip projecting over the right atrium. No visible right-sided catheter. No evidence of pneumothorax. 2. Perihilar vascular congestion with diffuse interstitial prominence, compatible with mild diffuse pulmonary interstitial edema, similar to previous. 3. Small right pleural effusion. Electronically Signed   By: Jeannine Boga M.D.   On: 02/17/2018 06:54   Dg Chest Port 1 View  Result Date: 02/17/2018 CLINICAL DATA:  Pneumonia. EXAM: PORTABLE CHEST 1 VIEW COMPARISON:  Radiograph yesterday. FINDINGS: Left internal jugular central venous catheter tip in the proximal SVC. Low lung volumes with bibasilar opacities and pleural effusions, progressive. Mild cardiomegaly unchanged. Vascular  congestion with central pulmonary edema, similar to prior. No pneumothorax. IMPRESSION: Progressive pleural effusions and bibasilar opacities, likely atelectasis. Unchanged pulmonary edema. Electronically Signed   By: Keith Rake M.D.   On: 02/17/2018 03:46  Medications:   . cefTRIAXone (ROCEPHIN)  IV 1 g (02/18/18 0942)  . norepinephrine (LEVOPHED) Adult infusion Stopped (02/16/18 1156)   . aspirin EC  81 mg Oral Daily  . chlorhexidine  15 mL Mouth Rinse BID  . Chlorhexidine Gluconate Cloth  6 each Topical Q0600  . diphenhydrAMINE  12.5 mg Intravenous Once  . docusate sodium  100 mg Oral BID  . feeding supplement  1 Container Oral BID BM  . feeding supplement (NEPRO CARB STEADY)  237 mL Oral Q24H  . insulin aspart  0-9 Units Subcutaneous TID WC  . insulin glargine  28 Units Subcutaneous QHS  . ipratropium-albuterol  3 mL Nebulization Q6H  . loratadine  10 mg Oral Daily  . mouth rinse  15 mL Mouth Rinse q12n4p  . methylPREDNISolone (SOLU-MEDROL) injection  60 mg Intravenous Q6H  . metoprolol tartrate  25 mg Oral BID  . sodium chloride flush  10-40 mL Intracatheter Q12H   acetaminophen **OR** acetaminophen, ipratropium-albuterol, labetalol, ondansetron **OR** ondansetron (ZOFRAN) IV, oxyCODONE, sodium chloride flush  Assessment/ Plan:  62 y.o.Hispanic female with diabetes, hypertension, coronary atherosclerosis, h/o MI, h/o coronary angiography 2017, followed by Dr Nehemiah Massed was admitted on 02/14/2018 with cute pyelonephritis.   1.  Acute renal failure, ATN from severe sepsis.  No recent creatinine.  It was 0.80 from July 2015 2.  Hyperkalemia, now hypokalemia 3.  Acute pyelonephritis, sepsis (E. coli, urinary source) 4.  Metabolic Acidosis 5.  Acute pulmonary edema and volume overload 6.  DM-2 with CKD. HbA1c 9.4 % on 02/14/2018 7.  CKD, unspec. H/o proteinuria since 2015   ? If patient had delayed dialyzer reaction. Improved with Solumedrol and iv benadryl Patient  remains oliguric.   Plan: HD today Premedicate with benadryl and Solumedrol UF goal ~ 1-1.5 as tolerated Potassium normal Obtain outpatient records from [facility] Consider 2 D echo   LOS: [place] 11/30/20199:47 [place], [place]  Note: This note was prepared with Dragon dictation. Any transcription errors are unintentional

## 2018-02-18 NOTE — Progress Notes (Signed)
HD tx end    02/18/18 1239  Vital Signs  Pulse Rate 73  Pulse Rate Source Monitor  Resp (!) 21  BP 137/63  BP Location Right Arm  BP Method Automatic  Patient Position (if appropriate) Lying  Oxygen Therapy  SpO2 100 %  O2 Device Nasal Cannula  O2 Flow Rate (L/min) 4 L/min  During Hemodialysis Assessment  Dialysis Fluid Bolus Normal Saline  Bolus Amount (mL) 250 mL  Intra-Hemodialysis Comments Tx completed

## 2018-02-18 NOTE — Progress Notes (Signed)
HD tx start    02/18/18 1005  Vital Signs  Pulse Rate 93  Pulse Rate Source Monitor  Resp (!) 22  BP (!) 151/67  BP Location Right Arm  BP Method Automatic  Patient Position (if appropriate) Lying  Oxygen Therapy  SpO2 96 %  O2 Device Nasal Cannula  O2 Flow Rate (L/min) 4 L/min  During Hemodialysis Assessment  Blood Flow Rate (mL/min) 250 mL/min  Arterial Pressure (mmHg) -80 mmHg  Venous Pressure (mmHg) 70 mmHg  Transmembrane Pressure (mmHg) 60 mmHg  Ultrafiltration Rate (mL/min) 800 mL/min  Dialysate Flow Rate (mL/min) 500 ml/min  Conductivity: Machine  13.8  HD Safety Checks Performed Yes  Dialysis Fluid Bolus Normal Saline  Bolus Amount (mL) 250 mL  Intra-Hemodialysis Comments Tx initiated  Hemodialysis Catheter Left Internal jugular Triple-lumen  Placement Date/Time: 02/17/18 0630   Placed prior to admission: No  Time Out: Correct patient  Person Inserting Catheter: Dr. Soyla Murphy  Orientation: Left  Access Location: Internal jugular  Hemodialysis Catheter Type: Triple-lumen  Blue Lumen Status Infusing  Red Lumen Status Infusing

## 2018-02-18 NOTE — Progress Notes (Signed)
Pre HD assessment  02/18/18 0945  Hand-Off documentation  Report given to (Full Name) Stark Bray  Report received from (Full Name) Hiral Patel,RN  Vital Signs  Temp 97.8 F (36.6 C)  Temp Source Oral  Pulse Rate 93  Pulse Rate Source Monitor  Resp (!) 23  BP (!) 151/65  BP Location Right Arm  BP Method Automatic  Patient Position (if appropriate) Lying  Oxygen Therapy  SpO2 94 %  O2 Device Nasal Cannula  O2 Flow Rate (L/min) 4 L/min  Pain Assessment  Pain Scale 0-10  Pain Score 0  Dialysis Weight  Weight 61 kg  Type of Weight Pre-Dialysis  Time-Out for Hemodialysis  What Procedure? HD  Pt Identifiers(min of two) First/Last Name;MRN/Account#  Correct Site? Yes  Correct Side? Yes  Correct Procedure? Yes  Consents Verified? Yes  Rad Studies Available? N/A  Safety Precautions Reviewed? Yes  Research scientist (physical sciences)  (3A)  Station Number  (bedaside ICU 01)  UF/Alarm Test Passed  Conductivity: Meter 13.6  Conductivity: Machine  13.7  pH 7.4  Reverse Osmosis WRO #3  Normal Saline Lot Number [id number]  Dialyzer Lot Number [id number]  Disposable Set Lot Number [id number]  Machine Temperature 98.6 F (37 C)  Musician and Audible Yes  Blood Lines Intact and Secured Yes  Pre Treatment Patient Checks  Vascular access used during treatment Catheter  Hepatitis B Surface Antigen Results Negative  Date Hepatitis B Surface Antigen Drawn 02/17/18  Hepatitis B Surface Antibody  (<10)  Date Hepatitis B Surface Antibody Drawn 02/17/18  Hemodialysis Consent Verified Yes  Hemodialysis Standing Orders Initiated Yes  ECG (Telemetry) Monitor On Yes  Prime Ordered Normal Saline  Length of  DialysisTreatment -hour(s) 2.5 Hour(s)  Dialyzer Elisio 17H NR  Dialysate 3K, 2.5 Ca  Dialysis Anticoagulant None  Dialysate Flow Ordered 500  Blood Flow Rate Ordered 250 mL/min  Ultrafiltration Goal 1.5 Liters  Pre Treatment Labs Phosphorus;Other (Comment) (Magnesium, BMP)   Dialysis Blood Pressure Support Ordered Normal Saline  Education / Care Plan  Dialysis Education Provided Yes  Documented Education in Care Plan Yes  Hemodialysis Catheter Left Internal jugular Triple-lumen  Placement Date/Time: 02/17/18 0630   Placed prior to admission: No  Time Out: Correct patient  Person Inserting Catheter: Dr. Soyla Murphy  Orientation: Left  Access Location: Internal jugular  Hemodialysis Catheter Type: Triple-lumen  Site Condition No complications  Blue Lumen Status Heparin locked  Red Lumen Status Heparin locked  Purple Lumen Status Infusing  Dressing Type Biopatch  Dressing Status Clean;Dry;Intact  Drainage Description None

## 2018-02-18 NOTE — Progress Notes (Signed)
[facility] [place] NAME: Adora Yeh    MR#:  [id number]  DATE OF BIRTH:  [birth year]  SUBJECTIVE:  CHIEF COMPLAINT:   Chief Complaint  Patient presents with  . Abnormal Lab  Patient seen and evaluated today Conversation done with the help of interpreter Family is at bedside Decreased abdominal pain Comfortable on oxygen by nasal cannula Patient had an episode of tachycardia and increased blood pressure and tachypnea yesterday after dialysis.  She received IV Solu-Medrol and Benadryl after the episode yesterday. Today she has been treated predialysis with Benadryl and currently on dialysis  REVIEW OF SYSTEMS:    ROS  CONSTITUTIONAL: No documented fever. Has fatigue, weakness. No weight gain, no weight loss.  EYES: No blurry or double vision.  ENT: No tinnitus. No postnasal drip. No redness of the oropharynx.  RESPIRATORY: No cough, no wheeze, no hemoptysis. Has dyspnea.  CARDIOVASCULAR: No chest pain. No orthopnea. No palpitations. No syncope.  GASTROINTESTINAL: Decreased nausea, decreased vomiting , no diarrhea. Decreased abdominal pain. No melena or hematochezia.  GENITOURINARY: No dysuria or hematuria.  ENDOCRINE: No polyuria or nocturia. No heat or cold intolerance.  HEMATOLOGY: No anemia. No bruising. No bleeding.  INTEGUMENTARY: No rashes. No lesions.  MUSCULOSKELETAL: No arthritis. No swelling. No gout.  NEUROLOGIC: No numbness, tingling, or ataxia. No seizure-type activity.  PSYCHIATRIC: No anxiety. No insomnia. No ADD.   DRUG ALLERGIES:  No Known Allergies  VITALS:  Blood pressure (!) 146/67, pulse 76, temperature 97.8 F (36.6 C), temperature source Oral, resp. rate (!) 21, height 4\' 11"  (1.499 m), weight 61 kg, SpO2 98 %.  PHYSICAL EXAMINATION:   Physical Exam  GENERAL:  62 y.o.-year-old patient lying in the bed with no acute distress.  EYES: Pupils equal, round, reactive to light and accommodation. No  scleral icterus. Extraocular muscles intact.  HEENT: Head atraumatic, normocephalic. Oropharynx dry and nasopharynx clear.  NECK:  Supple, no jugular venous distention. No thyroid enlargement, no tenderness.  LUNGS: Improved breath sounds bilaterally, bilateral rhonchi heard. No use of accessory muscles of respiration.  Off BiPAP CARDIOVASCULAR: S1, S2 tachycardia noted. No murmurs, rubs, or gallops.  ABDOMEN: Soft , decreased tenderness  nondistended. Bowel sounds present. No organomegaly or mass.  EXTREMITIES: No cyanosis, clubbing or edema b/l.    NEUROLOGIC: Cranial nerves II through XII are intact. No focal Motor or sensory deficits b/l.   PSYCHIATRIC: The patient is alert and oriented x 3.  SKIN: No obvious rash, lesion, or ulcer.   LABORATORY PANEL:   CBC Recent Labs  Lab 02/18/18 0354  WBC 19.0*  HGB 8.9*  HCT 26.8*  PLT 119*   ------------------------------------------------------------------------------------------------------------------ Chemistries  Recent Labs  Lab 02/16/18 2058  02/18/18 0354 02/18/18 0845  NA 138   < > 139  --   K 3.4*   < > 3.7  --   CL 93*   < > 94*  --   CO2 18*   < > 29  --   GLUCOSE 181*   < > 216*  --   BUN 80*   < > 73*  --   CREATININE 5.10*   < > 4.20*  --   CALCIUM 6.1*   < > 7.4*  --   MG 1.7   < >  --  2.1  AST 544*  --   --   --   ALT 216*  --   --   --   ALKPHOS 186*  --   --   --  BILITOT 1.6*  --   --   --    < > = values in this interval not displayed.   ------------------------------------------------------------------------------------------------------------------  Cardiac Enzymes No results for input(s): TROPONINI in the last 168 hours. ------------------------------------------------------------------------------------------------------------------  RADIOLOGY:  Nm Hepatobiliary Liver Func  Result Date: 02/17/2018 CLINICAL DATA:  Sepsis. Gallstones and wall thickening on prior ultrasound. EXAM: NUCLEAR  MEDICINE HEPATOBILIARY IMAGING TECHNIQUE: Sequential images of the abdomen were obtained out to 60 minutes following intravenous administration of radiopharmaceutical. RADIOPHARMACEUTICALS:  5.85 mCi Tc-39m  Choletec IV COMPARISON:  CT and ultrasound 02/16/2018 FINDINGS: Prompt uptake and excretion of activity by the liver. Of radiotracer passes into the small bowel compatible with patent common bile duct. No activity seen in gallbladder at 60 minutes. Therefore, 2 milligrams of morphine was administered IV and imaging was continued. Despite the morphine, gallbladder was not visualized. IMPRESSION: Nonvisualization of the gallbladder despite morphine administration. Findings concerning for cystic duct obstruction. Electronically Signed   By: Rolm Baptise M.D.   On: 02/17/2018 14:18   Dg Chest Port 1 View  Result Date: 02/18/2018 CLINICAL DATA:  Pneumonia. EXAM: PORTABLE CHEST 1 VIEW COMPARISON:  02/17/2018 FINDINGS: Stable position of left IJ approach central venous catheter with tip over the expected location of the right atrium. Cardiomediastinal silhouette is normal. Mediastinal contours appear intact. There is no evidence of pneumothorax. Mixed pattern pulmonary edema. Possible small bilateral pleural effusions. Osseous structures are without acute abnormality. Soft tissues are grossly normal. IMPRESSION: Mixed pattern pulmonary edema. Possible bilateral small pleural effusions. Electronically Signed   By: Fidela Salisbury M.D.   On: 02/18/2018 07:32   Dg Chest Port 1 View  Result Date: 02/17/2018 CLINICAL DATA:  Initial evaluation for new central line, right side. EXAM: PORTABLE CHEST 1 VIEW COMPARISON:  Prior radiograph from 02/17/2018 FINDINGS: Left IJ approach central venous catheter has been replaced with a new larger caliber line, tip projects over the right atrium. No visible right-sided catheter. Mild cardiomegaly, stable. Mediastinal silhouette within normal limits. Lungs hypoinflated.  Perihilar vascular congestion with diffuse interstitial prominence, compatible with mild diffuse pulmonary interstitial edema, similar to previous. Persistent small right pleural effusion. Associated bibasilar opacities favored to reflect atelectasis and/or edema. Infiltrates could be considered in the correct clinical setting. No pneumothorax. Osseous structures unchanged. IMPRESSION: 1. Interval placement of left IJ approach central venous catheter with tip projecting over the right atrium. No visible right-sided catheter. No evidence of pneumothorax. 2. Perihilar vascular congestion with diffuse interstitial prominence, compatible with mild diffuse pulmonary interstitial edema, similar to previous. 3. Small right pleural effusion. Electronically Signed   By: Jeannine Boga M.D.   On: 02/17/2018 06:54   Dg Chest Port 1 View  Result Date: 02/17/2018 CLINICAL DATA:  Pneumonia. EXAM: PORTABLE CHEST 1 VIEW COMPARISON:  Radiograph yesterday. FINDINGS: Left internal jugular central venous catheter tip in the proximal SVC. Low lung volumes with bibasilar opacities and pleural effusions, progressive. Mild cardiomegaly unchanged. Vascular congestion with central pulmonary edema, similar to prior. No pneumothorax. IMPRESSION: Progressive pleural effusions and bibasilar opacities, likely atelectasis. Unchanged pulmonary edema. Electronically Signed   By: Keith Rake M.D.   On: 02/17/2018 03:46     ASSESSMENT AND PLAN:   62 year old female patient with with history of hypertension, hyperlipidemia, type 2 diabetes mellitus currently in ICU for sepsis and renal failure   -Septic shock secondary to urosepsis resolving Continue IV fluids and IV pressor medication Close input output monitoring Follow-up cultures  -Sepsis secondary to pyelonephritis from E. coli  IV fluids and antibiotics Follow-up cultures and lactic acid Discontinued IV meropenem antibiotic Patient started on IV Rocephin  antibiotic based on culture and sensitivities  -Metabolic acidosis improved  -Acute pulmonary edema and volume overload Dialysis to remove excess fluid  -E. coli urinary tract infection  -Status post respiratory failure Off BiPAP Continue oxygen via nasal cannula  -Acute kidney injury and renal failure secondary to ATN from sepsis On dialysis Nephrology follow-up appreciated  -Acute hyperkalemia resolved  -Cholecystitis Continue IV antibiotics and follow-up LFTs No surgical intervention as of now as per surgical consultant HIDA scan suggestive of nonvisualization of gallbladder consistent with cholecystitis In view of sepsis, renal failure and respiratory failure no surgery recommended at this point Continue IV antibiotics Percutaneous cholecystostomy tube also not recommended  -Leukocytosis secondary to sepsis improving Continue IV antibiotics  -Anemia Monitor hemoglobin hematocrit  -Atelectasis and pneumonia Continue IV meropenem antibiotic Zosyn and vancomycin  -Hypocalcemia Replace electrolytes  All the records are reviewed and case discussed with Care Management/Social Worker. Management plans discussed with the patient, family and they are in agreement.  CODE STATUS: Full code  DVT Prophylaxis: SCDs  TOTAL TIME TAKING CARE OF THIS PATIENT: 33 minutes.   POSSIBLE D/C IN  3 to 4 DAYS, DEPENDING ON CLINICAL CONDITION.  Saundra Shelling M.D on 02/18/2018 at 11:01 AM  Between 7am to 6pm - Pager - [phone]  After 6pm go to [email or web address] - password [email or web address] [facility]  Office  [phone]  CC: Primary care physician; Petra Kuba, MD  Note: This dictation was prepared with Dragon dictation along with smaller phrase technology. Any transcriptional errors that result from this process are unintentional.

## 2018-02-19 ENCOUNTER — Inpatient Hospital Stay: Payer: 59

## 2018-02-19 LAB — COMPREHENSIVE METABOLIC PANEL
ALT: 118 U/L — ABNORMAL HIGH (ref 0–44)
AST: 149 U/L — AB (ref 15–41)
Albumin: 2.6 g/dL — ABNORMAL LOW (ref 3.5–5.0)
Alkaline Phosphatase: 212 U/L — ABNORMAL HIGH (ref 38–126)
Anion gap: 11 (ref 5–15)
BUN: 60 mg/dL — ABNORMAL HIGH (ref 8–23)
CALCIUM: 8.2 mg/dL — AB (ref 8.9–10.3)
CO2: 30 mmol/L (ref 22–32)
Chloride: 97 mmol/L — ABNORMAL LOW (ref 98–111)
Creatinine, Ser: 3.47 mg/dL — ABNORMAL HIGH (ref 0.44–1.00)
GFR calc Af Amer: 16 mL/min — ABNORMAL LOW (ref >60)
GFR calc non Af Amer: 14 mL/min — ABNORMAL LOW (ref >60)
Glucose, Bld: 188 mg/dL — ABNORMAL HIGH (ref 70–99)
POTASSIUM: 3.3 mmol/L — AB (ref 3.5–5.1)
Sodium: 138 mmol/L (ref 135–145)
Total Bilirubin: 0.5 mg/dL (ref 0.3–1.2)
Total Protein: 5.6 g/dL — ABNORMAL LOW (ref 6.5–8.1)

## 2018-02-19 LAB — CBC WITH DIFFERENTIAL/PLATELET
Abs Immature Granulocytes: 0.34 10*3/uL — ABNORMAL HIGH (ref 0.00–0.07)
Basophils Absolute: 0.1 10*3/uL (ref 0.0–0.1)
Basophils Relative: 0 %
Eosinophils Absolute: 0 10*3/uL (ref 0.0–0.5)
Eosinophils Relative: 0 %
HCT: 27.6 % — ABNORMAL LOW (ref 36.0–46.0)
Hemoglobin: 9.1 g/dL — ABNORMAL LOW (ref 12.0–15.0)
Immature Granulocytes: 1 %
Lymphocytes Relative: 3 %
Lymphs Abs: 0.7 10*3/uL (ref 0.7–4.0)
MCH: 26.7 pg (ref 26.0–34.0)
MCHC: 33 g/dL (ref 30.0–36.0)
MCV: 80.9 fL (ref 80.0–100.0)
Monocytes Absolute: 0.4 10*3/uL (ref 0.1–1.0)
Monocytes Relative: 2 %
Neutro Abs: 23.5 10*3/uL — ABNORMAL HIGH (ref 1.7–7.7)
Neutrophils Relative %: 94 %
Platelets: 88 10*3/uL — ABNORMAL LOW (ref 150–400)
RBC: 3.41 MIL/uL — ABNORMAL LOW (ref 3.87–5.11)
RDW: 18.9 % — ABNORMAL HIGH (ref 11.5–15.5)
WBC: 25.1 10*3/uL — ABNORMAL HIGH (ref 4.0–10.5)
nRBC: 0 % (ref 0.0–0.2)

## 2018-02-19 LAB — GLUCOSE, CAPILLARY
Glucose-Capillary: 296 mg/dL — ABNORMAL HIGH (ref 70–99)
Glucose-Capillary: 242 mg/dL — ABNORMAL HIGH (ref 70–99)
Glucose-Capillary: 211 mg/dL — ABNORMAL HIGH (ref 70–99)
Glucose-Capillary: 175 mg/dL — ABNORMAL HIGH (ref 70–99)
Glucose-Capillary: 106 mg/dL — ABNORMAL HIGH (ref 70–99)
Glucose-Capillary: 122 mg/dL — ABNORMAL HIGH (ref 70–99)
Glucose-Capillary: 113 mg/dL — ABNORMAL HIGH (ref 70–99)
Glucose-Capillary: 143 mg/dL — ABNORMAL HIGH (ref 70–99)
Glucose-Capillary: 164 mg/dL — ABNORMAL HIGH (ref 70–99)
Glucose-Capillary: 221 mg/dL — ABNORMAL HIGH (ref 70–99)
Glucose-Capillary: 238 mg/dL — ABNORMAL HIGH (ref 70–99)
Glucose-Capillary: 210 mg/dL — ABNORMAL HIGH (ref 70–99)
Glucose-Capillary: 139 mg/dL — ABNORMAL HIGH (ref 70–99)
Glucose-Capillary: 195 mg/dL — ABNORMAL HIGH (ref 70–99)
Glucose-Capillary: 134 mg/dL — ABNORMAL HIGH (ref 70–99)
Glucose-Capillary: 113 mg/dL — ABNORMAL HIGH (ref 70–99)

## 2018-02-19 LAB — BASIC METABOLIC PANEL
Anion gap: 14 (ref 5–15)
BUN: 75 mg/dL — ABNORMAL HIGH (ref 8–23)
CHLORIDE: 94 mmol/L — AB (ref 98–111)
CO2: 28 mmol/L (ref 22–32)
Calcium: 8.2 mg/dL — ABNORMAL LOW (ref 8.9–10.3)
Creatinine, Ser: 3.81 mg/dL — ABNORMAL HIGH (ref 0.44–1.00)
GFR calc Af Amer: 14 mL/min — ABNORMAL LOW (ref >60)
GFR calc non Af Amer: 12 mL/min — ABNORMAL LOW (ref >60)
Glucose, Bld: 217 mg/dL — ABNORMAL HIGH (ref 70–99)
Potassium: 3.6 mmol/L (ref 3.5–5.1)
Sodium: 136 mmol/L (ref 135–145)

## 2018-02-19 LAB — MAGNESIUM: Magnesium: 2.1 mg/dL (ref 1.7–2.4)

## 2018-02-19 LAB — PHOSPHORUS: Phosphorus: 4 mg/dL (ref 2.5–4.6)

## 2018-02-19 MED ORDER — CALCIUM CARBONATE ANTACID 500 MG PO CHEW
200.0000 mg | CHEWABLE_TABLET | Freq: Once | ORAL | Status: AC
  Administered 2018-02-19: 200 mg via ORAL
  Filled 2018-02-19: qty 1

## 2018-02-19 MED ORDER — SODIUM CHLORIDE 0.9 % IV SOLN
INTRAVENOUS | Status: DC | PRN
  Administered 2018-02-23: 1000 mL via INTRAVENOUS

## 2018-02-19 MED ORDER — INSULIN ASPART 100 UNIT/ML ~~LOC~~ SOLN
0.0000 [IU] | SUBCUTANEOUS | Status: DC
  Administered 2018-02-19: 5 [IU] via SUBCUTANEOUS
  Administered 2018-02-19: 3 [IU] via SUBCUTANEOUS
  Administered 2018-02-19 – 2018-02-20 (×2): 2 [IU] via SUBCUTANEOUS
  Filled 2018-02-19 (×4): qty 1

## 2018-02-19 MED ORDER — LACTULOSE 10 GM/15ML PO SOLN: 10.0000 g | Freq: Two times a day (BID) | ORAL | Status: DC | PRN

## 2018-02-19 MED ORDER — INSULIN DETEMIR 100 UNIT/ML ~~LOC~~ SOLN
4.0000 [IU] | Freq: Once | SUBCUTANEOUS | Status: AC
  Administered 2018-02-19: 4 [IU] via SUBCUTANEOUS
  Filled 2018-02-19: qty 0.04

## 2018-02-19 MED ORDER — INSULIN ASPART 100 UNIT/ML ~~LOC~~ SOLN: 0.0000 [IU] | SUBCUTANEOUS | Status: DC

## 2018-02-19 MED ORDER — POTASSIUM CHLORIDE CRYS ER 20 MEQ PO TBCR
20.0000 meq | EXTENDED_RELEASE_TABLET | Freq: Once | ORAL | Status: AC
  Administered 2018-02-19: 20 meq via ORAL
  Filled 2018-02-19: qty 1

## 2018-02-19 MED ORDER — ALUM & MAG HYDROXIDE-SIMETH 200-200-20 MG/5ML PO SUSP
15.0000 mL | ORAL | Status: DC | PRN
  Filled 2018-02-19: qty 30

## 2018-02-19 MED ORDER — SENNA 8.6 MG PO TABS
1.0000 | ORAL_TABLET | Freq: Every day | ORAL | Status: DC
  Administered 2018-02-19 – 2018-02-23 (×4): 8.6 mg via ORAL
  Filled 2018-02-19 (×4): qty 1

## 2018-02-19 MED ORDER — PANTOPRAZOLE SODIUM 40 MG PO TBEC
40.0000 mg | DELAYED_RELEASE_TABLET | Freq: Every day | ORAL | Status: DC
  Administered 2018-02-19 – 2018-02-24 (×5): 40 mg via ORAL
  Filled 2018-02-19 (×5): qty 1

## 2018-02-19 MED ORDER — METOCLOPRAMIDE HCL 5 MG/ML IJ SOLN
5.0000 mg | Freq: Once | INTRAMUSCULAR | Status: AC
  Administered 2018-02-19: 5 mg via INTRAVENOUS
  Filled 2018-02-19: qty 2

## 2018-02-19 NOTE — Progress Notes (Signed)
[facility] Progress Note Patient Name: Catalina Salasar DOB: 1956-02-23 MRN: [id number]   Date of Service  02/19/2018  HPI/Events of Note  constipation  eICU Interventions  Lactulose 15 ml po tid prn constipation        Fiza Nation U Letoya Stallone 02/19/2018, 1:14 AM

## 2018-02-19 NOTE — Progress Notes (Signed)
[place], [place] 02/19/18  Subjective:   Conversation through [place] interpreter Patient remains critically ill. UOP 70 last 24 hours Patient is now off of noninvasive positive pressure ventilation therapy and is on 4 L [place] No reaction after HD yesterday  Objective:  Vital signs in last 24 hours:  Temp:  [97.7 F (36.5 C)-98.4 F (36.9 C)] 97.7 F (36.5 C) (12/01 0730) Pulse Rate:  [69-100] 87 (12/01 1100) Resp:  [12-27] 21 (12/01 1100) BP: (125-156)/(52-90) 142/90 (12/01 1100) SpO2:  [93 %-100 %] 95 % (12/01 1100) Weight:  [59.2 kg] 59.2 kg (11/30 1247)  Weight change: -1.6 kg Filed Weights   02/18/18 0500 02/18/18 0945 02/18/18 1247  Weight: 59.1 kg 61 kg 59.2 kg    Intake/Output:    Intake/Output Summary (Last 24 hours) at 02/19/2018 1211 Last data filed at 02/19/2018 1100 Gross per 24 hour  Intake 300.26 ml  Output 1592 ml  Net -1291.74 ml     Physical Exam: General:  Critically ill-appearing  HEENT  dry mouth  Neck  supple  Pulm/lungs  mild basilar crackles,    CVS/Heart  regular, + S3 gallop  Abdomen:   Mild, diffuse tenderness, soft  Extremities:  no edema  Neurologic:  Alert, able to interact and answer questions  Skin:  Normal turgor, no acute rashes    Foley in place       Basic Metabolic Panel:  Recent Labs  Lab 02/17/18 0458 02/17/18 1429 02/17/18 2028 02/18/18 0354 02/18/18 0845 02/18/18 1643 02/19/18 0417  NA 138 138 139 139  --  137 138  K 3.2* 3.1* 4.4 3.7  --  3.9 3.3*  CL 91* 90* 91* 94*  --  93* 97*  CO2 22 25 28 29   --  29 30  GLUCOSE 177* 206* 193* 216*  --  335* 188*  BUN 85* 88* 59* 73*  --  46* 60*  CREATININE 5.31* 5.41* 4.12* 4.20*  --  3.12* 3.47*  CALCIUM 6.2* 6.5* 7.6* 7.4*  --  7.9* 8.2*  MG 1.6*  --  2.0  --  2.1 2.0 2.1  PHOS 6.8*  --  5.0*  --  6.5* 4.2 4.0     CBC: Recent Labs  Lab 02/15/18 1720 02/16/18 0353  02/16/18 1848 02/16/18 2058 02/17/18 0458 02/18/18 0354  02/19/18 0417  WBC 15.9* 27.7*  --   --  26.2* 19.4* 19.0* 25.1*  NEUTROABS 14.8*  --   --   --  23.7* 17.8* 17.9* 23.5*  HGB 8.2* 6.3*   < > 7.9* 8.0* 7.6* 8.9* 9.1*  HCT 27.5* 20.1*   < > 24.1* 24.1* 22.7* 26.8* 27.6*  MCV 97.5 89.7  --   --  81.7 80.8 81.0 80.9  PLT 258 197  --   --  172 157 119* 88*   < > = values in this interval not displayed.      Lab Results  Component Value Date   HEPBSAG Negative 02/17/2018   HEPBSAB Non Reactive 02/17/2018      Microbiology:  Recent Results (from the past 240 hour(s))  Urine culture     Status: Abnormal   Collection Time: 02/14/18  6:44 PM  Result Value Ref Range Status   Specimen Description   Final    URINE, RANDOM Performed at [facility], [address], [place],  [address]    Special Requests   Final    NONE Performed at [facility], [address] [place], [place] [address]    Culture >=100,000 COLONIES/mL ESCHERICHIA COLI (A)  Final   Report Status 02/17/2018 FINAL  Final   Organism ID, Bacteria ESCHERICHIA COLI (A)  Final      Susceptibility   Escherichia coli - MIC*    AMPICILLIN <=2 SENSITIVE Sensitive     CEFAZOLIN <=4 SENSITIVE Sensitive     CEFTRIAXONE <=1 SENSITIVE Sensitive     CIPROFLOXACIN <=0.25 SENSITIVE Sensitive     GENTAMICIN <=1 SENSITIVE Sensitive     IMIPENEM <=0.25 SENSITIVE Sensitive     NITROFURANTOIN <=16 SENSITIVE Sensitive     TRIMETH/SULFA <=20 SENSITIVE Sensitive     AMPICILLIN/SULBACTAM <=2 SENSITIVE Sensitive     PIP/TAZO <=4 SENSITIVE Sensitive     Extended ESBL NEGATIVE Sensitive     * >=100,000 COLONIES/mL ESCHERICHIA COLI  MRSA PCR Screening     Status: None   Collection Time: 02/15/18  5:13 PM  Result Value Ref Range Status   MRSA by PCR NEGATIVE NEGATIVE Final    Comment:        The GeneXpert MRSA Assay (FDA approved for NASAL specimens only), is one component of a comprehensive MRSA colonization surveillance program. It is not intended to  diagnose MRSA infection nor to guide or monitor treatment for MRSA infections. Performed at [facility], [address] [place], [place] [address]   CULTURE, BLOOD (ROUTINE X 2) w Reflex to ID Panel     Status: None (Preliminary result)   Collection Time: 02/15/18  7:10 PM  Result Value Ref Range Status   Specimen Description BLOOD LEFT ANTECUBITAL  Final   Special Requests   Final    BOTTLES DRAWN AEROBIC AND ANAEROBIC Blood Culture adequate volume   Culture   Final    NO GROWTH 4 DAYS Performed at [facility], [address] [place], [place] [address]    Report Status PENDING  Incomplete  CULTURE, BLOOD (ROUTINE X 2) w Reflex to ID Panel     Status: None (Preliminary result)   Collection Time: 02/15/18  7:25 PM  Result Value Ref Range Status   Specimen Description BLOOD BLOOD RIGHT HAND  Final   Special Requests   Final    BOTTLES DRAWN AEROBIC AND ANAEROBIC Blood Culture adequate volume   Culture   Final    NO GROWTH 4 DAYS Performed at [facility], [address] [place], [place] [address]    Report Status PENDING  Incomplete  Urine Culture     Status: None   Collection Time: 02/15/18  8:10 PM  Result Value Ref Range Status   Specimen Description   Final    URINE, CATHETERIZED Performed at [facility], [address], [place], [place] [address]    Special Requests   Final    NONE Performed at [facility], [address] [place], [place] [address]    Culture   Final    NO GROWTH Performed at [facility], [place] [address], [place], [place] [address]    Report Status 02/17/2018 FINAL  Final  C difficile quick scan w PCR reflex     Status: None   Collection Time: 02/16/18  5:20 AM  Result Value Ref Range Status   C Diff antigen NEGATIVE NEGATIVE Final   C Diff toxin NEGATIVE NEGATIVE Final   C Diff interpretation No C. difficile detected.  Final    Comment: Performed at [facility], [place],  [place] [address]    Coagulation Studies: No results for input(s): LABPROT, INR in the last 72 hours.  Urinalysis: No results for input(s): COLORURINE, LABSPEC, PHURINE, GLUCOSEU, HGBUR, BILIRUBINUR, KETONESUR, PROTEINUR, UROBILINOGEN, NITRITE, LEUKOCYTESUR in the last 72 hours.  Invalid input(s): APPERANCEUR    Imaging: Nm Hepatobiliary Liver Func  Result Date: 02/17/2018 CLINICAL DATA:  Sepsis. Gallstones and wall thickening on prior ultrasound. EXAM: NUCLEAR MEDICINE HEPATOBILIARY IMAGING TECHNIQUE: Sequential images of the abdomen were obtained out to 60 minutes following intravenous administration of radiopharmaceutical. RADIOPHARMACEUTICALS:  5.85 mCi Tc-52m  Choletec IV COMPARISON:  CT and ultrasound 02/16/2018 FINDINGS: Prompt uptake and excretion of activity by the liver. Of radiotracer passes into the small bowel compatible with patent common bile duct. No activity seen in gallbladder at 60 minutes. Therefore, 2 milligrams of morphine was administered IV and imaging was continued. Despite the morphine, gallbladder was not visualized. IMPRESSION: Nonvisualization of the gallbladder despite morphine administration. Findings concerning for cystic duct obstruction. Electronically Signed   By: Rolm Baptise M.D.   On: 02/17/2018 14:18   Dg Chest Port 1 View  Result Date: 02/19/2018 CLINICAL DATA:  Pneumonia. EXAM: PORTABLE CHEST 1 VIEW COMPARISON:  02/18/2018 FINDINGS: Left jugular catheter terminates at the superior cavoatrial junction. The cardiomediastinal silhouette is unchanged. Pulmonary vascular congestion and diffuse interstitial opacities are similar to the prior study. Veiling opacities in both lung bases are unchanged and consistent with small pleural effusions. Bibasilar airspace opacities likely reflect atelectasis although pneumonia is not excluded. No pneumothorax is identified. IMPRESSION: Unchanged pulmonary edema and pleural effusions. Electronically  Signed   By: Logan Bores M.D.   On: 02/19/2018 08:54   Dg Chest Port 1 View  Result Date: 02/18/2018 CLINICAL DATA:  Pneumonia. EXAM: PORTABLE CHEST 1 VIEW COMPARISON:  02/17/2018 FINDINGS: Stable position of left IJ approach central venous catheter with tip over the expected location of the right atrium. Cardiomediastinal silhouette is normal. Mediastinal contours appear intact. There is no evidence of pneumothorax. Mixed pattern pulmonary edema. Possible small bilateral pleural effusions. Osseous structures are without acute abnormality. Soft tissues are grossly normal. IMPRESSION: Mixed pattern pulmonary edema. Possible bilateral small pleural effusions. Electronically Signed   By: Fidela Salisbury M.D.   On: 02/18/2018 07:32     Medications:   . sodium chloride    . cefTRIAXone (ROCEPHIN)  IV 200 mL/hr at 02/19/18 1000   . aspirin EC  81 mg Oral Daily  . docusate sodium  100 mg Oral BID  . feeding supplement (NEPRO CARB STEADY)  237 mL Oral Q24H  . insulin aspart  0-15 Units Subcutaneous Q4H  . loratadine  10 mg Oral Daily  . mouth rinse  15 mL Mouth Rinse q12n4p  . metoprolol tartrate  25 mg Oral BID  . sodium chloride flush  10-40 mL Intracatheter Q12H   sodium chloride, acetaminophen **OR** acetaminophen, ipratropium-albuterol, labetalol, lactulose, ondansetron **OR** ondansetron (ZOFRAN) IV, oxyCODONE, sodium chloride flush  Assessment/ Plan:  62 y.o.Hispanic female with diabetes, hypertension, coronary atherosclerosis, h/o MI, h/o coronary angiography 2017, followed by Dr Nehemiah Massed was admitted on 02/14/2018 with Acute pyelonephritis.   1.  Acute renal failure, ATN from severe sepsis.  No recent creatinine.  It was 0.80 from July 2015 2.  Hyperkalemia, now hypokalemia 3.  Acute pyelonephritis, sepsis (E. coli, urinary source) 4.  Metabolic Acidosis 5.  Acute pulmonary edema and volume overload 6.  DM-2 with CKD. HbA1c 9.4 % on 02/14/2018 7.  CKD, unspec. H/o proteinuria  since 2015   ?  If patient had delayed dialyzer reaction. Improved with Solumedrol and iv benadryl Patient remains oliguric.   Plan: HD on Monday. Premedicate with Oral benadryl and prednisone prior to dialysis 1500 cc low - replace orally Obtain outpatient records from [facility] on Monday Consider 2 D echo   LOS: [place] 12/1/201912:11 PM  [place], [place]  Note: This note was prepared with Dragon dictation. Any transcription errors are unintentional

## 2018-02-19 NOTE — Progress Notes (Signed)
eLink Physician-Brief Progress Note Patient Name: Cathy Cline DOB: 10/04/1955 MRN: [id number]   Date of Service  02/19/2018  HPI/Events of Note  Constipated and bloated feeling, KUB earlier today was unremarkable. Pt currently NPO but would like to take ordered laxatives.  eICU Interventions  Diet order changed from NPO to NPO except for medications to allow oral laxative intake. PCCM primary attending physician to enter definitive diet order in AM        [name] 02/19/2018, 11:13 PM

## 2018-02-19 NOTE — Progress Notes (Signed)
[place] [facility] [place] NAME: Cathy Cline    MR#:  [id number]  DATE OF BIRTH:  [birth year]  SUBJECTIVE:  CHIEF COMPLAINT:   Chief Complaint  Patient presents with  . Abnormal Lab  Patient seen and evaluated today Conversation done with the help of interpreter Family is at bedside mild abdominal pain Comfortable on oxygen by nasal cannula On IV insulin drip for control of blood sugars  REVIEW OF SYSTEMS:    ROS  CONSTITUTIONAL: No documented fever. Has fatigue, weakness. No weight gain, no weight loss.  EYES: No blurry or double vision.  ENT: No tinnitus. No postnasal drip. No redness of the oropharynx.  RESPIRATORY: No cough, no wheeze, no hemoptysis. Has dyspnea.  CARDIOVASCULAR: No chest pain. No orthopnea. No palpitations. No syncope.  GASTROINTESTINAL: Decreased nausea, decreased vomiting , no diarrhea.  Mild abdominal pain. No melena or hematochezia.  GENITOURINARY: No dysuria or hematuria.  ENDOCRINE: No polyuria or nocturia. No heat or cold intolerance.  HEMATOLOGY: No anemia. No bruising. No bleeding.  INTEGUMENTARY: No rashes. No lesions.  MUSCULOSKELETAL: No arthritis. No swelling. No gout.  NEUROLOGIC: No numbness, tingling, or ataxia. No seizure-type activity.  PSYCHIATRIC: No anxiety. No insomnia. No ADD.   DRUG ALLERGIES:  No Known Allergies  VITALS:  Blood pressure (!) 156/73, pulse 85, temperature 97.7 F (36.5 C), temperature source Oral, resp. rate (!) 24, height 4\' 11"  (1.499 m), weight 59.2 kg, SpO2 95 %.  PHYSICAL EXAMINATION:   Physical Exam  GENERAL:  62 y.o.-year-old patient lying in the bed with no acute distress.  EYES: Pupils equal, round, reactive to light and accommodation. No scleral icterus. Extraocular muscles intact.  HEENT: Head atraumatic, normocephalic. Oropharynx dry and nasopharynx clear.  NECK:  Supple, no jugular venous distention. No thyroid enlargement, no tenderness.  LUNGS:  Improved breath sounds bilaterally, bilateral rhonchi heard. No use of accessory muscles of respiration.  Off BiPAP CARDIOVASCULAR: S1, S2 tachycardia noted. No murmurs, rubs, or gallops.  ABDOMEN: Soft , periumbilical tenderness  nondistended. Bowel sounds present. No organomegaly or mass.  EXTREMITIES: No cyanosis, clubbing or edema b/l.    NEUROLOGIC: Cranial nerves II through XII are intact. No focal Motor or sensory deficits b/l.   PSYCHIATRIC: The patient is alert and oriented x 3.  SKIN: No obvious rash, lesion, or ulcer.   LABORATORY PANEL:   CBC Recent Labs  Lab 02/19/18 0417  WBC 25.1*  HGB 9.1*  HCT 27.6*  PLT 88*   ------------------------------------------------------------------------------------------------------------------ Chemistries  Recent Labs  Lab 02/19/18 0417  NA 138  K 3.3*  CL 97*  CO2 30  GLUCOSE 188*  BUN 60*  CREATININE 3.47*  CALCIUM 8.2*  MG 2.1  AST 149*  ALT 118*  ALKPHOS 212*  BILITOT 0.5   ------------------------------------------------------------------------------------------------------------------  Cardiac Enzymes No results for input(s): TROPONINI in the last 168 hours. ------------------------------------------------------------------------------------------------------------------  RADIOLOGY:  Nm Hepatobiliary Liver Func  Result Date: 02/17/2018 CLINICAL DATA:  Sepsis. Gallstones and wall thickening on prior ultrasound. EXAM: NUCLEAR MEDICINE HEPATOBILIARY IMAGING TECHNIQUE: Sequential images of the abdomen were obtained out to 60 minutes following intravenous administration of radiopharmaceutical. RADIOPHARMACEUTICALS:  5.85 mCi Tc-39m  Choletec IV COMPARISON:  CT and ultrasound 02/16/2018 FINDINGS: Prompt uptake and excretion of activity by the liver. Of radiotracer passes into the small bowel compatible with patent common bile duct. No activity seen in gallbladder at 60 minutes. Therefore, 2 milligrams of morphine was  administered IV and imaging was continued. Despite  the morphine, gallbladder was not visualized. IMPRESSION: Nonvisualization of the gallbladder despite morphine administration. Findings concerning for cystic duct obstruction. Electronically Signed   By: Rolm Baptise M.D.   On: 02/17/2018 14:18   Dg Chest Port 1 View  Result Date: 02/19/2018 CLINICAL DATA:  Pneumonia. EXAM: PORTABLE CHEST 1 VIEW COMPARISON:  02/18/2018 FINDINGS: Left jugular catheter terminates at the superior cavoatrial junction. The cardiomediastinal silhouette is unchanged. Pulmonary vascular congestion and diffuse interstitial opacities are similar to the prior study. Veiling opacities in both lung bases are unchanged and consistent with small pleural effusions. Bibasilar airspace opacities likely reflect atelectasis although pneumonia is not excluded. No pneumothorax is identified. IMPRESSION: Unchanged pulmonary edema and pleural effusions. Electronically Signed   By: Logan Bores M.D.   On: 02/19/2018 08:54   Dg Chest Port 1 View  Result Date: 02/18/2018 CLINICAL DATA:  Pneumonia. EXAM: PORTABLE CHEST 1 VIEW COMPARISON:  02/17/2018 FINDINGS: Stable position of left IJ approach central venous catheter with tip over the expected location of the right atrium. Cardiomediastinal silhouette is normal. Mediastinal contours appear intact. There is no evidence of pneumothorax. Mixed pattern pulmonary edema. Possible small bilateral pleural effusions. Osseous structures are without acute abnormality. Soft tissues are grossly normal. IMPRESSION: Mixed pattern pulmonary edema. Possible bilateral small pleural effusions. Electronically Signed   By: Fidela Salisbury M.D.   On: 02/18/2018 07:32     ASSESSMENT AND PLAN:   62 year old female patient with with history of hypertension, hyperlipidemia, type 2 diabetes mellitus currently in ICU for sepsis and renal failure   -Septic shock secondary to urosepsis improved Continue IV fluids   Off IV pressor medication Close input output monitoring  -Sepsis secondary to pyelonephritis from E. coli IV fluids and antibiotics Continue IV Rocephin antibiotic  -Hyperglycemia Wean insulin drip Probably secondary steroids  -Metabolic acidosis improved  -Acute pulmonary edema and volume overload Dialysis to remove excess fluid as per schedule  -E. coli urinary tract infection improving  -Status post respiratory failure Off BiPAP Continue oxygen via nasal cannula  -Acute kidney injury and renal failure secondary to ATN from sepsis On dialysis Nephrology follow-up appreciated  -Acute hyperkalemia resolved  -Cholecystitis Continue IV antibiotics and follow-up LFTs No surgical intervention as of now as per surgical consultant HIDA scan suggestive of nonvisualization of gallbladder consistent with cholecystitis In view of sepsis, renal failure and respiratory failure no surgery recommended at this point Continue IV antibiotics Percutaneous cholecystostomy tube also not recommended at this time  -Leukocytosis secondary to IV steroids Control blood sugars Wean steroids Continue IV antibiotics  -Anemia Monitor hemoglobin hematocrit  -Atelectasis and pneumonia Continue IV meropenem antibiotic Zosyn and vancomycin  -Hypocalcemia Replace electrolytes  -Transfer to medical floor once stable  All the records are reviewed and case discussed with Care Management/Social Worker. Management plans discussed with the patient, family and they are in agreement.  CODE STATUS: Full code  DVT Prophylaxis: SCDs  TOTAL TIME TAKING CARE OF THIS PATIENT: 34 minutes.   POSSIBLE D/C IN  3 to 4 DAYS, DEPENDING ON CLINICAL CONDITION.  Saundra Shelling M.D on 02/19/2018 at 11:21 AM  Between 7am to 6pm - Pager - [phone]  After 6pm go to [email or web address] - password [email or web address] [facility]  Office  [phone]  CC: Primary care physician; Petra Kuba,  MD  Note: This dictation was prepared with Dragon dictation along with smaller phrase technology. Any transcriptional errors that result from this process are unintentional.

## 2018-02-19 NOTE — Plan of Care (Signed)
Pt. A&O x 4 overnight, but did not sleep due to overall discomfort.   VSS on 4 L [place], UOP remains poor. Pt. Tolerated being pivoted to [facility] and recliner without incident. Blood sugars remained high, E-link MD ordered 2 units of regular insulin IV and the ICU glycemic control.  Pt. Has been on an insulin gtt since midnight. No care concerns at this time

## 2018-02-19 NOTE — Progress Notes (Signed)
Family members came to the nurse station and report epigastric pain of patient. The writing nurse assessed pt, and found active bowel sound. Pt just finished eating a bowl of chicken noodle soup. Symptoms reported to Dr. Soyla Murphy. Protonix and Tums ordered per Dr. Artist [place] order. Meds delivered to pt.

## 2018-02-19 NOTE — Progress Notes (Addendum)
Name: Sharisa Toves MRN: [id number] DOB: 09/17/1955     CONSULTATION DATE: 02/14/2018  Subjective & Objective: [place], Afebrile, out of bed to chair. Hyperglycemia on Insulin gtt.  PAST MEDICAL HISTORY :   has a past medical history of Carotid stenosis, Diabetes mellitus without complication ([place]), Hypercholesterolemia, Hypertension, Myocardial infarction ([place]), and Tachycardia.  has a past surgical history that includes Cardiac catheterization; Tubal ligation; Knee surgery; and Colonoscopy with propofol (N/A, 10/02/2015). Prior to Admission medications   Medication Sig Start Date End Date Taking? Authorizing Provider  amLODipine (NORVASC) 2.5 MG tablet Take 2.5 mg by mouth daily.   Yes [provider]  aspirin 81 MG tablet Take 81 mg by mouth daily.   Yes [provider]  atorvastatin (LIPITOR) 40 MG tablet Take 40 mg by mouth at bedtime.    Yes [provider]  cetirizine (ZYRTEC) 10 MG tablet Take 10 mg by mouth at bedtime.   Yes [provider]  insulin detemir (LEVEMIR) 100 UNIT/ML injection Inject 20-25 Units into the skin See admin instructions. Inject 25u under the skin every morning and inject 20u under the skin ever evening   Yes [provider]  losartan-hydrochlorothiazide (HYZAAR) 100-25 MG tablet Take 1 tablet by mouth daily.   Yes [provider]  metFORMIN (GLUCOPHAGE-XR) 500 MG 24 hr tablet Take 1,000 mg by mouth 2 (two) times daily.   Yes [provider]  metoprolol tartrate (LOPRESSOR) 25 MG tablet Take 25 mg by mouth 2 (two) times daily.   Yes [provider]   No Known Allergies  FAMILY HISTORY:  family history is not on file. SOCIAL HISTORY:  reports that she has never smoked. She has never used smokeless tobacco. She reports that she does not drink alcohol or use drugs.  REVIEW OF SYSTEMS:   Unable to obtain due to critical illness   VITAL SIGNS: Temp:  [97.7 F (36.5 C)-98.4 F  (36.9 C)] 97.7 F (36.5 C) (12/01 1200) Pulse Rate:  [82-100] 91 (12/01 1400) Resp:  [14-28] 28 (12/01 1400) BP: (100-156)/(52-90) 115/86 (12/01 1400) SpO2:  [93 %-98 %] 94 % (12/01 1400)  Physical Examination: Awake and oriented with no acute focal neurological deficits Tolerating [place] 3 L/min, no distress, bilateral equal air entry and no adventitious sounds S1 & S2 are audible with no murmur Benign abdominal exam, no tenderness and normal peristalses No leg edema  ASSESSMENT / PLAN: Acute respiratory failure, improved and tolerating [place] 3 L/min -Monitor work of breathing and O2 sat.  Sepsis with septic shock. procalcitonin > 150.00. improved -ABX + optimize hydration. Off pressors and monitor hemodynamics  *AKI with oliguria due to ATN. GFR 14 *Questionable allergic reaction to dialyzer -Optimize hydration, avoid nephrotoxins, monitor renal panel and urine output. -d/c Bicarb. gtt -On HD as per renal -A per renal plan to premedicate with Benadryl and Solumedrol.  Left pyelonephritis(E Coli). No obstructive uropathy and CT -Start on Rocephin and d/cMeropenem and follow his urine culture  Atelectasis and pneumonia.improved bibasilarairspace disease with pulmonary congestion and pl. eff -Rocephin s/p merop, Vanc , Cefep -Monitor CXR + CBC + FiO2  Anemia. No retroperitoneal hematoma on CT abd & Pel -Keep Hb > 7 gm/dl.  Questionable acute cholecystitis. Gall bladder is not visualized on HIDA scan.  -Management as per surgery. Plan for ABX x 2 weeks, no intervention was advised and recommendation for follow up as out patient.  Elevated transaminases (improved) with acute ischemic hepatitis -Optimize hemodynamics, avoid hepatotoxins and monitor  LFTs  Thrombocytopenia (worsening) -Monitor Plat  Hypokalemia -Replete and monitor electrolytes as per nephrology  Full code  DVT &GI prophylaxis. Continue with supportive care Family was updated at the  bedside  Critical care time 35 minutes   18:30  C/O abdominal distension. Able to pass gass Mild distension, normal peristalses and no tenderness. KUB Non obstructed bowel gas pattern -Reglan

## 2018-02-19 NOTE — Progress Notes (Signed)
02/19/2018  Subjective: Denies any nausea, vomiting, or worsening pain with po intake.  Denies any significant tenderness and only reports soreness.  Breathing more easily.  Vital signs: Temp:  [97.7 F (36.5 C)-98.4 F (36.9 C)] 97.7 F (36.5 C) (12/01 0730) Pulse Rate:  [67-100] 85 (12/01 1000) Resp:  [12-27] 24 (12/01 1000) BP: (125-156)/(52-85) 156/73 (12/01 1000) SpO2:  [93 %-100 %] 95 % (12/01 1000) Weight:  [59.2 kg] 59.2 kg (11/30 1247)   Intake/Output: 11/30 0701 - 12/01 0700 In: 161.1 [I.V.:50.9; IV Piggyback:110.2] Out: 1570 [Urine:68] Last BM Date: 02/19/18  Physical Exam: Constitutional:  No acute distress Abdomen:  Soft, nondistended, nontender to palpation except for some mild discomfort.  Negative Murphy's sign.  Labs:  Recent Labs    02/18/18 0354 02/19/18 0417  WBC 19.0* 25.1*  HGB 8.9* 9.1*  HCT 26.8* 27.6*  PLT 119* 88*   Recent Labs    02/18/18 1643 02/19/18 0417  NA 137 138  K 3.9 3.3*  CL 93* 97*  CO2 29 30  GLUCOSE 335* 188*  BUN 46* 60*  CREATININE 3.12* 3.47*  CALCIUM 7.9* 8.2*   No results for input(s): LABPROT, INR in the last 72 hours.  Imaging: Dg Chest Port 1 View  Result Date: 02/19/2018 CLINICAL DATA:  Pneumonia. EXAM: PORTABLE CHEST 1 VIEW COMPARISON:  02/18/2018 FINDINGS: Left jugular catheter terminates at the superior cavoatrial junction. The cardiomediastinal silhouette is unchanged. Pulmonary vascular congestion and diffuse interstitial opacities are similar to the prior study. Veiling opacities in both lung bases are unchanged and consistent with small pleural effusions. Bibasilar airspace opacities likely reflect atelectasis although pneumonia is not excluded. No pneumothorax is identified. IMPRESSION: Unchanged pulmonary edema and pleural effusions. Electronically Signed   By: Logan Bores M.D.   On: 02/19/2018 08:54    Assessment/Plan: This is a 62 y.o. female with acute cholecystitis, in setting of respiratory  distress, renal failure.  --From abdominal standpoint, she's tolerating a diet without any worsening pain, has a negative Murphy's sign, and only reports soreness without focal tenderness.  Her WBC is increased today, but this is likely from steroids rather than worsening infection.  No surgical intervention needed at this time, and does not appear to need a percutaneous cholecystostomy tube either. --Will need two week course total of antibiotic for her cholecystitis.  She may follow up with Dr. Celine Ahr as outpatient.     Melvyn Neth, [facility]

## 2018-02-20 ENCOUNTER — Inpatient Hospital Stay: Payer: 59

## 2018-02-20 LAB — COMPREHENSIVE METABOLIC PANEL
ALT: 78 U/L — ABNORMAL HIGH (ref 0–44)
AST: 78 U/L — ABNORMAL HIGH (ref 15–41)
Albumin: 2.3 g/dL — ABNORMAL LOW (ref 3.5–5.0)
Alkaline Phosphatase: 159 U/L — ABNORMAL HIGH (ref 38–126)
Anion gap: 14 (ref 5–15)
BUN: 81 mg/dL — ABNORMAL HIGH (ref 8–23)
CO2: 28 mmol/L (ref 22–32)
CREATININE: 4.03 mg/dL — AB (ref 0.44–1.00)
Calcium: 8.1 mg/dL — ABNORMAL LOW (ref 8.9–10.3)
Chloride: 97 mmol/L — ABNORMAL LOW (ref 98–111)
GFR calc Af Amer: 13 mL/min — ABNORMAL LOW (ref >60)
GFR calc non Af Amer: 11 mL/min — ABNORMAL LOW (ref >60)
Glucose, Bld: 129 mg/dL — ABNORMAL HIGH (ref 70–99)
Potassium: 3.5 mmol/L (ref 3.5–5.1)
Sodium: 139 mmol/L (ref 135–145)
TOTAL PROTEIN: 5.4 g/dL — AB (ref 6.5–8.1)
Total Bilirubin: 0.7 mg/dL (ref 0.3–1.2)

## 2018-02-20 LAB — CBC WITH DIFFERENTIAL/PLATELET
Abs Immature Granulocytes: 0.5 10*3/uL — ABNORMAL HIGH (ref 0.00–0.07)
BASOS PCT: 0 %
Basophils Absolute: 0.1 10*3/uL (ref 0.0–0.1)
EOS ABS: 0 10*3/uL (ref 0.0–0.5)
EOS PCT: 0 %
HCT: 29.1 % — ABNORMAL LOW (ref 36.0–46.0)
Hemoglobin: 9.4 g/dL — ABNORMAL LOW (ref 12.0–15.0)
Immature Granulocytes: 2 %
Lymphocytes Relative: 5 %
Lymphs Abs: 1 10*3/uL (ref 0.7–4.0)
MCH: 26.6 pg (ref 26.0–34.0)
MCHC: 32.3 g/dL (ref 30.0–36.0)
MCV: 82.2 fL (ref 80.0–100.0)
Monocytes Absolute: 0.9 10*3/uL (ref 0.1–1.0)
Monocytes Relative: 5 %
Neutro Abs: 17.9 10*3/uL — ABNORMAL HIGH (ref 1.7–7.7)
Neutrophils Relative %: 88 %
PLATELETS: 76 10*3/uL — AB (ref 150–400)
RBC: 3.54 MIL/uL — ABNORMAL LOW (ref 3.87–5.11)
RDW: 18.8 % — AB (ref 11.5–15.5)
WBC: 20.4 10*3/uL — ABNORMAL HIGH (ref 4.0–10.5)
nRBC: 0 % (ref 0.0–0.2)

## 2018-02-20 LAB — CULTURE, BLOOD (ROUTINE X 2)
CULTURE: NO GROWTH
Culture: NO GROWTH
SPECIAL REQUESTS: ADEQUATE
Special Requests: ADEQUATE

## 2018-02-20 LAB — GLUCOSE, CAPILLARY
Glucose-Capillary: 105 mg/dL — ABNORMAL HIGH (ref 70–99)
Glucose-Capillary: 109 mg/dL — ABNORMAL HIGH (ref 70–99)
Glucose-Capillary: 119 mg/dL — ABNORMAL HIGH (ref 70–99)
Glucose-Capillary: 122 mg/dL — ABNORMAL HIGH (ref 70–99)
Glucose-Capillary: 131 mg/dL — ABNORMAL HIGH (ref 70–99)
Glucose-Capillary: 81 mg/dL (ref 70–99)
Glucose-Capillary: 96 mg/dL (ref 70–99)

## 2018-02-20 MED ORDER — INSULIN ASPART 100 UNIT/ML ~~LOC~~ SOLN
0.0000 [IU] | Freq: Three times a day (TID) | SUBCUTANEOUS | Status: DC
  Administered 2018-02-20: 2 [IU] via SUBCUTANEOUS
  Administered 2018-02-21: 3 [IU] via SUBCUTANEOUS
  Administered 2018-02-21: 2 [IU] via SUBCUTANEOUS
  Administered 2018-02-21 – 2018-02-22 (×2): 3 [IU] via SUBCUTANEOUS
  Administered 2018-02-22: 8 [IU] via SUBCUTANEOUS
  Administered 2018-02-23: 2 [IU] via SUBCUTANEOUS
  Filled 2018-02-20 (×7): qty 1

## 2018-02-20 MED ORDER — INSULIN ASPART 100 UNIT/ML ~~LOC~~ SOLN
0.0000 [IU] | Freq: Every day | SUBCUTANEOUS | Status: DC
  Administered 2018-02-21: 2 [IU] via SUBCUTANEOUS
  Administered 2018-02-22: 3 [IU] via SUBCUTANEOUS
  Administered 2018-02-23: 2 [IU] via SUBCUTANEOUS
  Filled 2018-02-20 (×3): qty 1

## 2018-02-20 MED ORDER — DIPHENHYDRAMINE HCL 50 MG/ML IJ SOLN
25.0000 mg | INTRAMUSCULAR | Status: AC
  Administered 2018-02-20: 25 mg via INTRAVENOUS
  Filled 2018-02-20: qty 1

## 2018-02-20 MED ORDER — POTASSIUM CHLORIDE CRYS ER 20 MEQ PO TBCR: 40.0000 meq | EXTENDED_RELEASE_TABLET | Freq: Once | ORAL | Status: DC

## 2018-02-20 MED ORDER — METHYLPREDNISOLONE SODIUM SUCC 40 MG IJ SOLR
40.0000 mg | INTRAMUSCULAR | Status: AC
  Administered 2018-02-20: 40 mg via INTRAVENOUS
  Filled 2018-02-20: qty 1

## 2018-02-20 NOTE — Progress Notes (Signed)
Post HD Assessment    02/20/18 2250  Neurological  Level of Consciousness Alert  Orientation Level Oriented X4  Respiratory  Respiratory Pattern Regular;Unlabored;Symmetrical  Chest Assessment Chest expansion symmetrical  Bilateral Breath Sounds Clear  Cardiac  Pulse Regular  Heart Sounds S1, S2  Jugular Venous Distention (JVD) No  Cardiac Rhythm NSR  Vascular  R Radial Pulse +2  L Radial Pulse +2  R Dorsalis Pedis Pulse +1  L Dorsalis Pedis Pulse +1  Edema Generalized;Right lower extremity;Left lower extremity  Generalized Edema Other (Comment) (non-pitting )  RUE Edema None  LUE Edema None  RLE Edema Non-pitting  LLE Edema Non-Pitting  Perineal None  Sacral None  Integumentary  Integumentary (WDL) X  Skin Color Appropriate for ethnicity  Skin Integrity Catheter entry/exit site  Musculoskeletal  Musculoskeletal (WDL) X  Generalized Weakness Yes  GU Assessment  Genitourinary (WDL) X (HD pt)  Genitourinary Symptoms Oliguria  Urine Characteristics  Urine Color Yellow/straw  Urine Appearance Clear  Psychosocial  Psychosocial (WDL) WDL  Patient Behaviors Appropriate for situation;Cooperative;Anxious  Needs Expressed Denies  Emotional support given Given to patient

## 2018-02-20 NOTE — Progress Notes (Signed)
Pt left unit for dialysis at 1900.  Unable to complete assessment.

## 2018-02-20 NOTE — Progress Notes (Signed)
[facility], [place] 02/20/18  Subjective:  Patient remains oliguric at this time. BUN currently 81 with a creatinine of 4.0. Seen with aid of Spanish interpreter.  Objective:  Vital signs in last 24 hours:  Temp:  [97.7 F (36.5 C)-98.2 F (36.8 C)] 97.8 F (36.6 C) (12/02 0700) Pulse Rate:  [76-103] 93 (12/02 0800) Resp:  [15-33] 19 (12/02 0800) BP: (93-156)/(63-101) 133/83 (12/02 0800) SpO2:  [88 %-98 %] 93 % (12/02 0800)  Weight change:  Filed Weights   02/18/18 0500 02/18/18 0945 02/18/18 1247  Weight: 59.1 kg 61 kg 59.2 kg    Intake/Output:    Intake/Output Summary (Last 24 hours) at 02/20/2018 0957 Last data filed at 02/19/2018 2000 Gross per 24 hour  Intake 589.95 ml  Output 22 ml  Net 567.95 ml     Physical Exam: General:  Critically ill-appearing  HEENT  dry mouth  Neck  supple  Pulm/lungs  mild basilar crackles,  Normal effort  CVS/Heart  regular, S1S2  Abdomen:   Mild, diffuse tenderness, soft  Extremities:  no edema  Neurologic:  Alert, able to interact and answer questions  Skin:  Normal turgor, no acute rashes  Access:  L IJ temp HD catheter       Basic Metabolic Panel:  Recent Labs  Lab 02/17/18 0458  02/17/18 2028 02/18/18 0354 02/18/18 0845 02/18/18 1643 02/19/18 0417 02/19/18 1603 02/20/18 0416  NA 138   < > 139 139  --  137 138 136 139  K 3.2*   < > 4.4 3.7  --  3.9 3.3* 3.6 3.5  CL 91*   < > 91* 94*  --  93* 97* 94* 97*  CO2 22   < > 28 29  --  29 30 28 28   GLUCOSE 177*   < > 193* 216*  --  335* 188* 217* 129*  BUN 85*   < > 59* 73*  --  46* 60* 75* 81*  CREATININE 5.31*   < > 4.12* 4.20*  --  3.12* 3.47* 3.81* 4.03*  CALCIUM 6.2*   < > 7.6* 7.4*  --  7.9* 8.2* 8.2* 8.1*  MG 1.6*  --  2.0  --  2.1 2.0 2.1  --   --   PHOS 6.8*  --  5.0*  --  6.5* 4.2 4.0  --   --    < > = values in this interval not displayed.     CBC: Recent Labs  Lab 02/16/18 2058 02/17/18 0458 02/18/18 0354  02/19/18 0417 02/20/18 0416  WBC 26.2* 19.4* 19.0* 25.1* 20.4*  NEUTROABS 23.7* 17.8* 17.9* 23.5* 17.9*  HGB 8.0* 7.6* 8.9* 9.1* 9.4*  HCT 24.1* 22.7* 26.8* 27.6* 29.1*  MCV 81.7 80.8 81.0 80.9 82.2  PLT 172 157 119* 88* 76*      Lab Results  Component Value Date   HEPBSAG Negative 02/17/2018   HEPBSAB Non Reactive 02/17/2018      Microbiology:  Recent Results (from the past 240 hour(s))  Urine culture     Status: Abnormal   Collection Time: 02/14/18  6:44 PM  Result Value Ref Range Status   Specimen Description   Final    URINE, RANDOM Performed at [facility], [address] [place], [place] [address]    Special Requests   Final    NONE Performed at [facility], [address] [place], [place] [address]    Culture >=100,000 COLONIES/mL ESCHERICHIA COLI (A)  Final   Report Status 02/17/2018 FINAL  Final   Organism ID, Bacteria ESCHERICHIA COLI (A)  Final      Susceptibility   Escherichia coli - MIC*    AMPICILLIN <=2 SENSITIVE Sensitive     CEFAZOLIN <=4 SENSITIVE Sensitive     CEFTRIAXONE <=1 SENSITIVE Sensitive     CIPROFLOXACIN <=0.25 SENSITIVE Sensitive     GENTAMICIN <=1 SENSITIVE Sensitive     IMIPENEM <=0.25 SENSITIVE Sensitive     NITROFURANTOIN <=16 SENSITIVE Sensitive     TRIMETH/SULFA <=20 SENSITIVE Sensitive     AMPICILLIN/SULBACTAM <=2 SENSITIVE Sensitive     PIP/TAZO <=4 SENSITIVE Sensitive     Extended ESBL NEGATIVE Sensitive     * >=100,000 COLONIES/mL ESCHERICHIA COLI  MRSA PCR Screening     Status: None   Collection Time: 02/15/18  5:13 PM  Result Value Ref Range Status   MRSA by PCR NEGATIVE NEGATIVE Final    Comment:        The GeneXpert MRSA Assay (FDA approved for NASAL specimens only), is one component of a comprehensive MRSA colonization surveillance program. It is not intended to diagnose MRSA infection nor to guide or monitor treatment for MRSA infections. Performed at [facility], [address] [place], [place] [address]   CULTURE, BLOOD (ROUTINE X 2) w Reflex to ID Panel     Status: None (Preliminary result)   Collection Time: 02/15/18  7:10 PM  Result Value Ref Range Status   Specimen Description BLOOD LEFT ANTECUBITAL  Final   Special Requests   Final    BOTTLES DRAWN AEROBIC AND ANAEROBIC Blood Culture adequate volume   Culture   Final    NO GROWTH 4 DAYS Performed at [facility], [address] [place], [place] [address]    Report Status PENDING  Incomplete  CULTURE, BLOOD (ROUTINE X 2) w Reflex to ID Panel     Status: None (Preliminary result)   Collection Time: 02/15/18  7:25 PM  Result Value Ref Range Status   Specimen Description BLOOD BLOOD RIGHT HAND  Final   Special Requests   Final    BOTTLES DRAWN AEROBIC AND ANAEROBIC Blood Culture adequate volume   Culture   Final    NO GROWTH 4 DAYS Performed at [facility], [address], [place], [place] [address]    Report Status PENDING  Incomplete  Urine Culture     Status: None   Collection Time: 02/15/18  8:10 PM  Result Value Ref Range Status   Specimen Description   Final    URINE, CATHETERIZED Performed at [facility], [address] [place], [place] [address]    Special Requests   Final    NONE Performed at [facility], [address], [place], [place] [address]    Culture   Final    NO GROWTH Performed at [facility], [place] [address], [place], [place] [address]    Report Status 02/17/2018 FINAL  Final  C difficile quick scan w PCR reflex     Status: None   Collection Time: 02/16/18  5:20 AM  Result Value Ref Range Status   C Diff antigen NEGATIVE NEGATIVE Final   C Diff toxin NEGATIVE NEGATIVE Final   C Diff interpretation No C. difficile detected.  Final    Comment: Performed at [facility], [address] [place],  [address]    Coagulation Studies: No results for input(s): LABPROT, INR in the last  72  hours.  Urinalysis: No results for input(s): COLORURINE, LABSPEC, PHURINE, GLUCOSEU, HGBUR, BILIRUBINUR, KETONESUR, PROTEINUR, UROBILINOGEN, NITRITE, LEUKOCYTESUR in the last 72 hours.  Invalid input(s): APPERANCEUR    Imaging: Dg Abd 1 View  Result Date: 02/19/2018 CLINICAL DATA:  Abdominal distension EXAM: ABDOMEN - 1 VIEW COMPARISON:  CT 02/16/2018, chest x-ray 02/19/2018 FINDINGS: Vascular catheter tip over the right atrium. Small pleural effusions and basilar consolidations. Bowel gas pattern is nonobstructed. Round calcification in the right upper quadrant corresponds to a gallstone. IMPRESSION: Nonobstructed bowel-gas pattern.  Gallstone. Electronically Signed   By: Donavan Foil M.D.   On: 02/19/2018 18:27   Dg Chest Port 1 View  Result Date: 02/20/2018 CLINICAL DATA:  62 year old female with congestive heart failure. EXAM: PORTABLE CHEST 1 VIEW COMPARISON:  02/19/2018 and earlier. FINDINGS: Portable AP upright view at 0553 hours. Stable left IJ approach central line. Stable lung volumes. Veiling bibasilar opacity with confluent retrocardiac opacity. Stable cardiac size and mediastinal contours. Persistent increased pulmonary vascular congestion. No pneumothorax. Negative visible bowel gas pattern. IMPRESSION: 1. Persistent pulmonary vascular congestion suggesting interstitial edema. 2. Continued bilateral pleural effusions and lower lobe collapse or consolidation. Electronically Signed   By: Genevie Ann M.D.   On: 02/20/2018 07:40   Dg Chest Port 1 View  Result Date: 02/19/2018 CLINICAL DATA:  Pneumonia. EXAM: PORTABLE CHEST 1 VIEW COMPARISON:  02/18/2018 FINDINGS: Left jugular catheter terminates at the superior cavoatrial junction. The cardiomediastinal silhouette is unchanged. Pulmonary vascular congestion and diffuse interstitial opacities are similar to the prior study. Veiling opacities in both lung bases are unchanged and consistent with small pleural effusions. Bibasilar airspace  opacities likely reflect atelectasis although pneumonia is not excluded. No pneumothorax is identified. IMPRESSION: Unchanged pulmonary edema and pleural effusions. Electronically Signed   By: Logan Bores M.D.   On: 02/19/2018 08:54     Medications:   . sodium chloride    . cefTRIAXone (ROCEPHIN)  IV 1 g (02/20/18 3244)   . aspirin EC  81 mg Oral Daily  . docusate sodium  100 mg Oral BID  . feeding supplement (NEPRO CARB STEADY)  237 mL Oral Q24H  . insulin aspart  0-15 Units Subcutaneous Q4H  . loratadine  10 mg Oral Daily  . mouth rinse  15 mL Mouth Rinse q12n4p  . metoprolol tartrate  25 mg Oral BID  . pantoprazole  40 mg Oral Daily  . senna  1 tablet Oral QHS  . sodium chloride flush  10-40 mL Intracatheter Q12H   sodium chloride, acetaminophen **OR** acetaminophen, ipratropium-albuterol, labetalol, lactulose, ondansetron **OR** ondansetron (ZOFRAN) IV, sodium chloride flush  Assessment/ Plan:  62 y.o.Hispanic female with diabetes, hypertension, coronary atherosclerosis, h/o MI, h/o coronary angiography 2017, followed by Dr Nehemiah Massed was admitted on 02/14/2018 with Acute pyelonephritis.   1.  Acute renal failure, ATN from severe sepsis.  No recent creatinine.  It was 0.80 from July 2015 2.  Hyperkalemia, now hypokalemia 3.  Acute pyelonephritis, sepsis (E. coli, urinary source) 4.  Metabolic Acidosis 5.  Acute pulmonary edema and volume overload 6.  DM-2 with CKD. HbA1c 9.4 % on 02/14/2018 7.  CKD, unspec. H/o proteinuria since 2015   ? If patient had delayed dialyzer reaction. Improved with Solumedrol and iv benadryl Patient remains oliguric.   Plan: Patient due for hemodialysis today.  Orders have been prepared.  Patient to receive prednisone and Solu-Medrol prior to treatment as she previously had a reaction.  Hopefully her renal function will improve over the  course of the hospitalization.  If not we may need to look at outpatient hemodialysis at least temporarily.    LOS: 6 Cathy Cline 12/2/20199:57 AM  [place], [place]  Note: This note was prepared with Dragon dictation. Any transcription errors are unintentional

## 2018-02-20 NOTE — Progress Notes (Signed)
Follow up - Critical Care Medicine Note  Patient Details:    Cathy Cline is an 62 y.o. female.  With a past medical history remarkable for carotid stenosis, diabetes, hyperlipidemia, hypertension, coronary artery disease, tachycardia, presented with Complaints of abdominal pain.  Associated with nausea and vomiting.  Found to have acute renal injury, CT scan of the abdomen revealed perinephric stranding consistent with pyelonephritis.  Lines, Airways, Drains:    Anti-infectives:  Anti-infectives (From admission, onward)   Start     Dose/Rate Route Frequency Ordered Stop   02/18/18 1000  cefTRIAXone (ROCEPHIN) 1 g in sodium chloride 0.9 % 100 mL IVPB     1 g 200 mL/hr over 30 Minutes Intravenous Every 24 hours 02/17/18 0851 02/22/18 0959   02/17/18 1030  cefTRIAXone (ROCEPHIN) 1 g in sodium chloride 0.9 % 100 mL IVPB     1 g 200 mL/hr over 30 Minutes Intravenous  Once 02/17/18 1022 02/17/18 1130   02/17/18 0830  cefTRIAXone (ROCEPHIN) 1 g in sodium chloride 0.9 % 100 mL IVPB  Status:  Discontinued     1 g 200 mL/hr over 30 Minutes Intravenous Every 24 hours 02/17/18 0828 02/17/18 0851   02/17/18 0000  vancomycin (VANCOCIN) IVPB 1000 mg/200 mL premix  Status:  Discontinued     1,000 mg 200 mL/hr over 60 Minutes Intravenous every 72 hours 02/16/18 0112 02/16/18 0906   02/16/18 1800  meropenem (MERREM) 500 mg in sodium chloride 0.9 % 100 mL IVPB  Status:  Discontinued     500 mg 200 mL/hr over 30 Minutes Intravenous Every 24 hours 02/15/18 1600 02/17/18 0828   02/16/18 0045  vancomycin (VANCOCIN) IVPB 750 mg/150 ml premix     750 mg 150 mL/hr over 60 Minutes Intravenous  Once 02/16/18 0034 02/16/18 0156   02/15/18 1400  meropenem (MERREM) 500 mg in sodium chloride 0.9 % 100 mL IVPB  Status:  Discontinued     500 mg 200 mL/hr over 30 Minutes Intravenous Every 24 hours 02/15/18 1336 02/15/18 1600   02/14/18 2130  piperacillin-tazobactam (ZOSYN) IVPB 3.375 g  Status:   Discontinued     3.375 g 12.5 mL/hr over 240 Minutes Intravenous Every 12 hours 02/14/18 2123 02/15/18 1326   02/14/18 1945  cefTRIAXone (ROCEPHIN) 1 g in sodium chloride 0.9 % 100 mL IVPB     1 g 200 mL/hr over 30 Minutes Intravenous  Once 02/14/18 1937 02/14/18 2025      Microbiology: Results for orders placed or performed during the hospital encounter of 02/14/18  Urine culture     Status: Abnormal   Collection Time: 02/14/18  6:44 PM  Result Value Ref Range Status   Specimen Description   Final    URINE, RANDOM Performed at [facility], [address] [place], [place] [address]    Special Requests   Final    NONE Performed at [facility], [address] [place], [place] [address]    Culture >=100,000 COLONIES/mL ESCHERICHIA COLI (A)  Final   Report Status 02/17/2018 FINAL  Final   Organism ID, Bacteria ESCHERICHIA COLI (A)  Final      Susceptibility   Escherichia coli - MIC*    AMPICILLIN <=2 SENSITIVE Sensitive     CEFAZOLIN <=4 SENSITIVE Sensitive     CEFTRIAXONE <=1 SENSITIVE Sensitive     CIPROFLOXACIN <=0.25 SENSITIVE Sensitive     GENTAMICIN <=1 SENSITIVE Sensitive     IMIPENEM <=0.25 SENSITIVE Sensitive     NITROFURANTOIN <=  16 SENSITIVE Sensitive     TRIMETH/SULFA <=20 SENSITIVE Sensitive     AMPICILLIN/SULBACTAM <=2 SENSITIVE Sensitive     PIP/TAZO <=4 SENSITIVE Sensitive     Extended ESBL NEGATIVE Sensitive     * >=100,000 COLONIES/mL ESCHERICHIA COLI  MRSA PCR Screening     Status: None   Collection Time: 02/15/18  5:13 PM  Result Value Ref Range Status   MRSA by PCR NEGATIVE NEGATIVE Final    Comment:        The GeneXpert MRSA Assay (FDA approved for NASAL specimens only), is one component of a comprehensive MRSA colonization surveillance program. It is not intended to diagnose MRSA infection nor to guide or monitor treatment for MRSA infections. Performed at [facility], [address] [place], [place]  [id number]   CULTURE, BLOOD (ROUTINE X 2) w Reflex to ID Panel     Status: None (Preliminary result)   Collection Time: 02/15/18  7:10 PM  Result Value Ref Range Status   Specimen Description BLOOD LEFT ANTECUBITAL  Final   Special Requests   Final    BOTTLES DRAWN AEROBIC AND ANAEROBIC Blood Culture adequate volume   Culture   Final    NO GROWTH 4 DAYS Performed at [facility], [address], [place], [place] [address]    Report Status PENDING  Incomplete  CULTURE, BLOOD (ROUTINE X 2) w Reflex to ID Panel     Status: None (Preliminary result)   Collection Time: 02/15/18  7:25 PM  Result Value Ref Range Status   Specimen Description BLOOD BLOOD RIGHT HAND  Final   Special Requests   Final    BOTTLES DRAWN AEROBIC AND ANAEROBIC Blood Culture adequate volume   Culture   Final    NO GROWTH 4 DAYS Performed at [facility], [address], [place], [place] [address]    Report Status PENDING  Incomplete  Urine Culture     Status: None   Collection Time: 02/15/18  8:10 PM  Result Value Ref Range Status   Specimen Description   Final    URINE, CATHETERIZED Performed at [facility], [address], [place], [place] [address]    Special Requests   Final    NONE Performed at [facility], [address], [place], [place] [address]    Culture   Final    NO GROWTH Performed at [facility], [place] [address] [place], [place] [address]    Report Status 02/17/2018 FINAL  Final  C difficile quick scan w PCR reflex     Status: None   Collection Time: 02/16/18  5:20 AM  Result Value Ref Range Status   C Diff antigen NEGATIVE NEGATIVE Final   C Diff toxin NEGATIVE NEGATIVE Final   C Diff interpretation No C. difficile detected.  Final    Comment: Performed at [facility], [address] [place], [place] [address]    Studies: Ct Abdomen Pelvis Wo Contrast  Result Date: 02/16/2018 CLINICAL DATA:  Unexplained anemia. Recent sepsis.  A CT scan from February 14, 2018 demonstrated an abnormal left kidney thought to represent infection of the renal collecting system with a recently passed stone considered less likely. EXAM: CT ABDOMEN AND PELVIS WITHOUT CONTRAST TECHNIQUE: Multidetector CT imaging of the abdomen and pelvis was performed following the standard protocol without IV contrast. COMPARISON:  February 14, 2018 CT scan FINDINGS: Lower chest: Bilateral pleural effusions are identified, larger on the left and new on  the right. Opacities underlying the effusions are likely atelectasis with infiltrate less likely. Coronary artery calcifications are noted. The lower chest is otherwise unremarkable. Hepatobiliary: The liver is unremarkable. The gallbladder is distended and contains a single stone. The gallbladder was better assessed with an ultrasound from this morning. Pancreas: Unremarkable. No pancreatic ductal dilatation or surrounding inflammatory changes. Spleen: Normal in size without focal abnormality. Adrenals/Urinary Tract: Adrenal glands are normal. No renal stones identified. No hydronephrosis on the right. There is persistent hydronephrosis on the left. There is bilateral perinephric stranding. There is mild left ureterectasis. The right ureter is normal. No ureteral stones. The bladder is decompressed with a Foley catheter but it remains mildly thick walled. There is air in the bladder from the Foley catheter. Stomach/Bowel: The colon is unremarkable.  The appendix is normal. Vascular/Lymphatic: Atherosclerotic changes are seen in the nonaneurysmal aorta. No adenopathy. Reproductive: Uterus and bilateral adnexa are unremarkable. Other: There is increased attenuation diffusely throughout the subcutaneous fat. There is also increased attenuation in the intra-abdominal fat as well as mild ascites extending down the pericolic gutters into the pelvis, new. No free air. Musculoskeletal: No acute or significant osseous findings.  IMPRESSION: 1. Left-sided hydronephrosis persists. On the previous study, there was increased perinephric stranding on the left as well compared to the right. I suspect the hydronephrosis on the left is likely acute to subacute although is strictly speaking age indeterminate. I doubt the findings are from a recently passed stone as there has been no significant change since February 14, 2018. An underlying obstruction which is not radiopaque such as a blood clot or urothelial lesion are possible. Infection of the left renal collecting system is also possible. Recommend clinical correlation and correlation with a urinalysis. 2. The gallbladder is distended with a single stone. If there is concern for acute cholecystitis, recommend HIDA scan. 3. Bilateral pleural effusions, increased attenuation in the subcutaneous fat, and increased attenuation in the intra-abdominal fat all suggest third spacing of fluid/edema. 4. The increased perinephric stranding on the right may be due to the overall increased third-spacing of fluid/edema. No hydronephrosis or stones seen on the right. 5. Opacity under the pleural effusions is likely atelectasis. 6. Coronary artery calcifications and abdominal aorta atherosclerotic change. Electronically Signed   By: Dorise Bullion III M.D   On: 02/16/2018 09:29   Dg Chest 1 View  Result Date: 02/15/2018 CLINICAL DATA:  62 y/o  F; encounter for central line placement. EXAM: CHEST  1 VIEW COMPARISON:  02/15/2018 chest radiograph. FINDINGS: Stable cardiomegaly given projection and technique. Aortic calcific atherosclerosis. Left central venous catheter tip projects over the upper SVC. No pneumothorax. No pleural effusion, consolidation, or pneumothorax. Pulmonary venous hypertension. No acute osseous abnormality is evident. IMPRESSION: 1. Left central venous catheter tip projects over the upper SVC. 2. Cardiomegaly and pulmonary venous hypertension. Electronically Signed   By: Kristine Garbe M.D.   On: 02/15/2018 22:41   Dg Abd 1 View  Result Date: 02/19/2018 CLINICAL DATA:  Abdominal distension EXAM: ABDOMEN - 1 VIEW COMPARISON:  CT 02/16/2018, chest x-ray 02/19/2018 FINDINGS: Vascular catheter tip over the right atrium. Small pleural effusions and basilar consolidations. Bowel gas pattern is nonobstructed. Round calcification in the right upper quadrant corresponds to a gallstone. IMPRESSION: Nonobstructed bowel-gas pattern.  Gallstone. Electronically Signed   By: Donavan Foil M.D.   On: 02/19/2018 18:27   Nm Hepatobiliary Liver Func  Result Date: 02/17/2018 CLINICAL DATA:  Sepsis. Gallstones and wall thickening on prior ultrasound.  EXAM: NUCLEAR MEDICINE HEPATOBILIARY IMAGING TECHNIQUE: Sequential images of the abdomen were obtained out to 60 minutes following intravenous administration of radiopharmaceutical. RADIOPHARMACEUTICALS:  5.85 mCi Tc-31m  Choletec IV COMPARISON:  CT and ultrasound 02/16/2018 FINDINGS: Prompt uptake and excretion of activity by the liver. Of radiotracer passes into the small bowel compatible with patent common bile duct. No activity seen in gallbladder at 60 minutes. Therefore, 2 milligrams of morphine was administered IV and imaging was continued. Despite the morphine, gallbladder was not visualized. IMPRESSION: Nonvisualization of the gallbladder despite morphine administration. Findings concerning for cystic duct obstruction. Electronically Signed   By: Rolm Baptise M.D.   On: 02/17/2018 14:18   US Renal  Result Date: 02/15/2018 CLINICAL DATA:  Renal failure. EXAM: RENAL / URINARY TRACT ULTRASOUND COMPLETE COMPARISON:  CT abdomen and pelvis 02/14/2018 FINDINGS: Right Kidney: Renal measurements: 8.7 x 4.3 x 5.0 cm = volume: 98 mL . Echogenicity within normal limits. No mass or hydronephrosis visualized. Left Kidney: Renal measurements: 13.2 x 6.6 x 6.0 cm = volume: 272 mL. Mild hydronephrosis without mass or perinephric fluid collection  identified. Bladder: Collapsed bladder, poorly evaluated. IMPRESSION: Persistent asymmetric enlargement of the left kidney with mild left hydronephrosis, similar to yesterday's CT. Electronically Signed   By: Logan Bores M.D.   On: 02/15/2018 14:32   Dg Chest Port 1 View  Result Date: 02/20/2018 CLINICAL DATA:  62 year old female with congestive heart failure. EXAM: PORTABLE CHEST 1 VIEW COMPARISON:  02/19/2018 and earlier. FINDINGS: Portable AP upright view at 0553 hours. Stable left IJ approach central line. Stable lung volumes. Veiling bibasilar opacity with confluent retrocardiac opacity. Stable cardiac size and mediastinal contours. Persistent increased pulmonary vascular congestion. No pneumothorax. Negative visible bowel gas pattern. IMPRESSION: 1. Persistent pulmonary vascular congestion suggesting interstitial edema. 2. Continued bilateral pleural effusions and lower lobe collapse or consolidation. Electronically Signed   By: Genevie Ann M.D.   On: 02/20/2018 07:40   Dg Chest Port 1 View  Result Date: 02/19/2018 CLINICAL DATA:  Pneumonia. EXAM: PORTABLE CHEST 1 VIEW COMPARISON:  02/18/2018 FINDINGS: Left jugular catheter terminates at the superior cavoatrial junction. The cardiomediastinal silhouette is unchanged. Pulmonary vascular congestion and diffuse interstitial opacities are similar to the prior study. Veiling opacities in both lung bases are unchanged and consistent with small pleural effusions. Bibasilar airspace opacities likely reflect atelectasis although pneumonia is not excluded. No pneumothorax is identified. IMPRESSION: Unchanged pulmonary edema and pleural effusions. Electronically Signed   By: Logan Bores M.D.   On: 02/19/2018 08:54   Dg Chest Port 1 View  Result Date: 02/18/2018 CLINICAL DATA:  Pneumonia. EXAM: PORTABLE CHEST 1 VIEW COMPARISON:  02/17/2018 FINDINGS: Stable position of left IJ approach central venous catheter with tip over the expected location of the right  atrium. Cardiomediastinal silhouette is normal. Mediastinal contours appear intact. There is no evidence of pneumothorax. Mixed pattern pulmonary edema. Possible small bilateral pleural effusions. Osseous structures are without acute abnormality. Soft tissues are grossly normal. IMPRESSION: Mixed pattern pulmonary edema. Possible bilateral small pleural effusions. Electronically Signed   By: Fidela Salisbury M.D.   On: 02/18/2018 07:32   Dg Chest Port 1 View  Result Date: 02/17/2018 CLINICAL DATA:  Initial evaluation for new central line, right side. EXAM: PORTABLE CHEST 1 VIEW COMPARISON:  Prior radiograph from 02/17/2018 FINDINGS: Left IJ approach central venous catheter has been replaced with a new larger caliber line, tip projects over the right atrium. No visible right-sided catheter. Mild cardiomegaly, stable. Mediastinal silhouette within normal  limits. Lungs hypoinflated. Perihilar vascular congestion with diffuse interstitial prominence, compatible with mild diffuse pulmonary interstitial edema, similar to previous. Persistent small right pleural effusion. Associated bibasilar opacities favored to reflect atelectasis and/or edema. Infiltrates could be considered in the correct clinical setting. No pneumothorax. Osseous structures unchanged. IMPRESSION: 1. Interval placement of left IJ approach central venous catheter with tip projecting over the right atrium. No visible right-sided catheter. No evidence of pneumothorax. 2. Perihilar vascular congestion with diffuse interstitial prominence, compatible with mild diffuse pulmonary interstitial edema, similar to previous. 3. Small right pleural effusion. Electronically Signed   By: Jeannine Boga M.D.   On: 02/17/2018 06:54   Dg Chest Port 1 View  Result Date: 02/17/2018 CLINICAL DATA:  Pneumonia. EXAM: PORTABLE CHEST 1 VIEW COMPARISON:  Radiograph yesterday. FINDINGS: Left internal jugular central venous catheter tip in the proximal SVC. Low  lung volumes with bibasilar opacities and pleural effusions, progressive. Mild cardiomegaly unchanged. Vascular congestion with central pulmonary edema, similar to prior. No pneumothorax. IMPRESSION: Progressive pleural effusions and bibasilar opacities, likely atelectasis. Unchanged pulmonary edema. Electronically Signed   By: Keith Rake M.D.   On: 02/17/2018 03:46   Dg Chest Port 1 View  Result Date: 02/16/2018 CLINICAL DATA:  Follow-up pneumonia EXAM: PORTABLE CHEST 1 VIEW COMPARISON:  02/15/2018 FINDINGS: Cardiac shadow remains enlarged. Left jugular central line is again seen in the mid superior vena cava. No pneumothorax is noted. Increasing pulmonary vascular congestion with interstitial edema is seen. No focal confluent infiltrate or effusion is noted. IMPRESSION: Increasing changes of CHF. Electronically Signed   By: Inez Catalina M.D.   On: 02/16/2018 07:44   Dg Chest Port 1 View  Result Date: 02/15/2018 CLINICAL DATA:  Acute respiratory failure EXAM: PORTABLE CHEST 1 VIEW COMPARISON:  10/01/2013, CT chest 09/22/2015 FINDINGS: Cardiomegaly. Bibasilar atelectasis. No pleural effusion or pneumothorax. IMPRESSION: Cardiomegaly with bibasilar atelectasis Electronically Signed   By: Donavan Foil M.D.   On: 02/15/2018 17:46   Ct Renal Stone Study  Result Date: 02/14/2018 CLINICAL DATA:  62 y/o F; leukocytosis and elevated creatinine. Left flank pain. Nausea and vomiting. EXAM: CT ABDOMEN AND PELVIS WITHOUT CONTRAST TECHNIQUE: Multidetector CT imaging of the abdomen and pelvis was performed following the standard protocol without IV contrast. COMPARISON:  10/02/2013 abdominal ultrasound. FINDINGS: Lower chest: Right lung base atelectasis. Trace left effusion. Mild coronary artery calcific atherosclerosis. Hepatobiliary: No focal liver abnormality is seen. Cholelithiasis. No gallbladder wall thickening. No biliary ductal dilatation. Pancreas: Unremarkable. No pancreatic ductal dilatation or  surrounding inflammatory changes. Spleen: Normal in size without focal abnormality. Adrenals/Urinary Tract: Normal adrenal glands. Normal right kidney. No urinary stone disease identified. The left kidney is enlarged, there is extensive left-sided perinephric stranding, and there is mild left hydronephrosis. No obstructing stone or mass identified. Bladder wall thickening. Stomach/Bowel: Stomach is within normal limits. Appendix appears normal. No evidence of bowel wall thickening, distention, or inflammatory changes. Vascular/Lymphatic: Aortic atherosclerosis. No enlarged abdominal or pelvic lymph nodes. Reproductive: Uterus and bilateral adnexa are unremarkable. Other: No abdominal wall hernia or abnormality. No abdominopelvic ascites. Musculoskeletal: L5-S1 grade 1 anterolisthesis and chronic L5 pars defects. No acute fracture identified. IMPRESSION: 1. Enlarged left kidney, left perinephric stranding, and mild left hydronephrosis. Bladder wall thickening. No obstructing stone or mass identified. Findings probably represent infection of the renal collecting system, less likely recently passed stone. 2. Cholelithiasis. 3. Mild coronary artery and aortic calcific atherosclerosis. Electronically Signed   By: Kristine Garbe M.D.   On: 02/14/2018 20:11  [place] Ekg Site Rite  Result Date: 02/15/2018 If Site Rite image not attached, placement could not be confirmed due to current cardiac rhythm.  US Abdomen Limited Ruq  Result Date: 02/16/2018 CLINICAL DATA:  Elevated LFTs. EXAM: ULTRASOUND ABDOMEN LIMITED RIGHT UPPER QUADRANT COMPARISON:  CT 02/14/2018 FINDINGS: Gallbladder: Distended containing intraluminal sludge and shadowing 1.2 cm gallstone. Mild gallbladder wall thickening of 3.6 mm. No sonographic Murphy sign noted by sonographer. Common bile duct: Diameter: 4 mm, normal. Liver: No focal lesion identified. Within normal limits in parenchymal echogenicity. Portal vein is patent on color Doppler  imaging with normal direction of blood flow towards the liver. Right pleural effusion incidentally noted. Trace perihepatic fluid inferiorly. IMPRESSION: 1. Gallbladder distention containing intraluminal sludge and stone. Borderline mild gallbladder wall thickening. Findings could represent acute cholecystitis in the appropriate clinical setting. Consider nuclear medicine hepatic biliary scan based on clinical concern. 2. No biliary dilatation. No focal hepatic abnormality to explain elevated LFTs. Electronically Signed   By: Keith Rake M.D.   On: 02/16/2018 01:52    Consults: Treatment Team:  Murlean Iba, MD Abbie Sons, MD Pccm, Ander Gaster, MD Fredirick Maudlin, MD   Subjective:    Overnight Issues: Patient with complaints of constipation and bloating feeling last evening.  KUB was negative.  This morning resting comfortably in no acute distress  Objective:  Vital signs for last 24 hours: Temp:  [97.7 F (36.5 C)-98.2 F (36.8 C)] 97.8 F (36.6 C) (12/02 0700) Pulse Rate:  [76-103] 91 (12/02 0700) Resp:  [15-33] 18 (12/02 0700) BP: (93-156)/(63-101) 128/86 (12/02 0700) SpO2:  [88 %-98 %] 91 % (12/02 0700)  Intake/Output from previous day: 12/01 0701 - 12/02 0700 In: 840 [P.O.:720; I.V.:19.9; IV Piggyback:100.1] Out: 22 [Urine:22]  Intake/Output this shift: No intake/output data recorded.  Vent settings for last 24 hours:    Physical Exam:  Vital signs: Please see the above listed vital signs HEENT: Trachea midline, no thyromegaly appreciated, no increased work of breathing Cardiovascular: Regular rate and rhythm Pulmonary: Clear to auscultation Abdominal exam.  Soft exam, positive bowel sounds, no focal tenderness or rebound Extremities: No clubbing, cyanosis or edema noted Neurologic: No focal deficits, moves all extremities  Assessment/Plan:   Sepsis.  Patient is hemodynamically stable off pressors.  Felt to be pyelonephritis.  Acute renal failure.   Worsening renal function, no acute indication for hemodialysis.  Probably ATN from sepsis superimposed on baseline renal disease.  Appreciate nephrology input  Cholecystitis.  Is being followed by surgery, HIDA scan suggestive of cholecystitis.  Patient looks comfortable, has had some abdominal pain, was kept n.p.o. last night, will resume diet and see how she does.  Surgery following  Leukocytosis.  Has had a decrease in white count.  Presently empirically treated with Rocephin  Anemia.  No evidence of active bleeding  At this point patient is hemodynamically stable, sitting up out of bed in chair, appears comfortable.  Will transition to telemetry transfer  [place] 02/20/2018  *Care during the described time interval was provided by me and/or other providers on the critical care team.  I have reviewed this patient's available data, including medical history, events of note, physical examination and test results as part of my evaluation.

## 2018-02-20 NOTE — Progress Notes (Signed)
Pre HD Assessment    02/20/18 1925  Neurological  Level of Consciousness Alert  Orientation Level Oriented X4  Respiratory  Respiratory Pattern Regular;Unlabored;Symmetrical  Chest Assessment Chest expansion symmetrical  Bilateral Breath Sounds Clear  Cardiac  Pulse Regular  Heart Sounds S1, S2  Jugular Venous Distention (JVD) No  Cardiac Rhythm NSR  Vascular  R Radial Pulse +2  L Radial Pulse +2  R Dorsalis Pedis Pulse +1  L Dorsalis Pedis Pulse +1  Edema Generalized;Right lower extremity;Left lower extremity  Generalized Edema Other (Comment) (non-pitting )  RUE Edema None  LUE Edema None  RLE Edema Non-pitting  LLE Edema Non-Pitting  Perineal None  Sacral None  Integumentary  Integumentary (WDL) X  Skin Color Appropriate for ethnicity  Skin Integrity Catheter entry/exit site  Musculoskeletal  Musculoskeletal (WDL) X  Generalized Weakness Yes  GU Assessment  Genitourinary (WDL) X (HD pt)  Genitourinary Symptoms Oliguria  Urine Characteristics  Urine Color Yellow/straw  Urine Appearance Clear  Psychosocial  Psychosocial (WDL) WDL  Patient Behaviors Appropriate for situation;Cooperative;Anxious  Needs Expressed Denies  Emotional support given Given to patient

## 2018-02-20 NOTE — Progress Notes (Signed)
HD treatment Complete    02/20/18 2228  Vital Signs  Pulse Rate 75  Pulse Rate Source Monitor  Resp 15  BP 114/77  BP Location Left Arm  BP Method Automatic  Patient Position (if appropriate) Lying  Oxygen Therapy  SpO2 100 %  O2 Device Nasal Cannula  O2 Flow Rate (L/min) 2 L/min  During Hemodialysis Assessment  Blood Flow Rate (mL/min) 300 mL/min  Arterial Pressure (mmHg) -100 mmHg  Venous Pressure (mmHg) 90 mmHg  Transmembrane Pressure (mmHg) 50 mmHg  Ultrafiltration Rate (mL/min) 320 mL/min  Dialysate Flow Rate (mL/min) 600 ml/min  Conductivity: Machine  13.8  HD Safety Checks Performed Yes  Intra-Hemodialysis Comments Progressing as prescribed;Tolerated well;Tx completed (UF 1000)

## 2018-02-20 NOTE — Progress Notes (Addendum)
[facility] Progress Note   Subjective: No acute events overnight. She denied any abdominal pain, nausea, emesis, fever, or chills. She has been able to tolerate a diet, and she is mobilizing well. Scheduled for hemodialysis today.   Medical Interpretor used to obtain history   Objective: Vital signs in last 24 hours: Temp:  [97.7 F (36.5 C)-98.2 F (36.8 C)] 97.8 F (36.6 C) (12/02 0700) Pulse Rate:  [76-103] 93 (12/02 0800) Resp:  [15-33] 19 (12/02 0800) BP: (93-138)/(63-101) 133/83 (12/02 0800) SpO2:  [88 %-98 %] 93 % (12/02 0800) Last BM Date: 02/19/18  Intake/Output from previous day: 12/01 0701 - 12/02 0700 In: 840 [P.O.:720; I.V.:19.9; IV Piggyback:100.1] Out: 22 [Urine:22] Intake/Output this shift: No intake/output data recorded.  PE: Gen:  Alert, NAD, pleasant Pulm:  Normal effort Abd: Soft, non-tender, non-distended Skin: warm and dry, no rashes  Psych: A&Ox3   Lab Results:  Recent Labs    02/19/18 0417 02/20/18 0416  WBC 25.1* 20.4*  HGB 9.1* 9.4*  HCT 27.6* 29.1*  PLT 88* 76*   BMET Recent Labs    02/19/18 1603 02/20/18 0416  NA 136 139  K 3.6 3.5  CL 94* 97*  CO2 28 28  GLUCOSE 217* 129*  BUN 75* 81*  CREATININE 3.81* 4.03*  CALCIUM 8.2* 8.1*   PT/INR No results for input(s): LABPROT, INR in the last 72 hours. CMP     Component Value Date/Time   NA 139 02/20/2018 0416   NA 140 10/05/2013 0329   K 3.5 02/20/2018 0416   K 4.7 10/05/2013 0329   CL 97 (L) 02/20/2018 0416   CL 110 (H) 10/05/2013 0329   CO2 28 02/20/2018 0416   CO2 24 10/05/2013 0329   GLUCOSE 129 (H) 02/20/2018 0416   GLUCOSE 244 (H) 10/05/2013 0329   BUN 81 (H) 02/20/2018 0416   BUN 11 10/05/2013 0329   CREATININE 4.03 (H) 02/20/2018 0416   CREATININE 0.80 10/05/2013 0329   CALCIUM 8.1 (L) 02/20/2018 0416   CALCIUM 7.9 (L) 10/05/2013 0329   PROT 5.4 (L) 02/20/2018 0416   PROT 6.0 (L) 10/03/2013 0452   ALBUMIN 2.3 (L) 02/20/2018 0416   ALBUMIN 2.4 (L) 10/03/2013 0452   AST 78 (H) 02/20/2018 0416   AST 165 (H) 10/03/2013 0452   ALT 78 (H) 02/20/2018 0416   ALT 186 (H) 10/03/2013 0452   ALKPHOS 159 (H) 02/20/2018 0416   ALKPHOS 129 (H) 10/03/2013 0452   BILITOT 0.7 02/20/2018 0416   BILITOT 1.0 10/03/2013 0452   GFRNONAA 11 (L) 02/20/2018 0416   GFRNONAA >60 10/05/2013 0329   GFRAA 13 (L) 02/20/2018 0416   GFRAA >60 10/05/2013 0329   Lipase  No results found for: LIPASE     Studies/Results: Dg Abd 1 View  Result Date: 02/19/2018 CLINICAL DATA:  Abdominal distension EXAM: ABDOMEN - 1 VIEW COMPARISON:  CT 02/16/2018, chest x-ray 02/19/2018 FINDINGS: Vascular catheter tip over the right atrium. Small pleural effusions and basilar consolidations. Bowel gas pattern is nonobstructed. Round calcification in the right upper quadrant corresponds to a gallstone. IMPRESSION: Nonobstructed bowel-gas pattern.  Gallstone. Electronically Signed   By: Donavan Foil M.D.   On: 02/19/2018 18:27   Dg Chest Port 1 View  Result Date: 02/20/2018 CLINICAL DATA:  62 year old female with congestive heart failure. EXAM: PORTABLE CHEST 1 VIEW COMPARISON:  02/19/2018 and earlier. FINDINGS: Portable AP upright view at 0553 hours. Stable left IJ approach central line. Stable lung volumes. Veiling bibasilar opacity with  confluent retrocardiac opacity. Stable cardiac size and mediastinal contours. Persistent increased pulmonary vascular congestion. No pneumothorax. Negative visible bowel gas pattern. IMPRESSION: 1. Persistent pulmonary vascular congestion suggesting interstitial edema. 2. Continued bilateral pleural effusions and lower lobe collapse or consolidation. Electronically Signed   By: Genevie Ann M.D.   On: 02/20/2018 07:40   Dg Chest Port 1 View  Result Date: 02/19/2018 CLINICAL DATA:  Pneumonia. EXAM: PORTABLE CHEST 1 VIEW COMPARISON:  02/18/2018 FINDINGS: Left jugular catheter terminates at the superior cavoatrial junction. The  cardiomediastinal silhouette is unchanged. Pulmonary vascular congestion and diffuse interstitial opacities are similar to the prior study. Veiling opacities in both lung bases are unchanged and consistent with small pleural effusions. Bibasilar airspace opacities likely reflect atelectasis although pneumonia is not excluded. No pneumothorax is identified. IMPRESSION: Unchanged pulmonary edema and pleural effusions. Electronically Signed   By: Logan Bores M.D.   On: 02/19/2018 08:54    Anti-infectives: Anti-infectives (From admission, onward)   Start     Dose/Rate Route Frequency Ordered Stop   02/18/18 1000  cefTRIAXone (ROCEPHIN) 1 g in sodium chloride 0.9 % 100 mL IVPB     1 g 200 mL/hr over 30 Minutes Intravenous Every 24 hours 02/17/18 0851 02/22/18 0959   02/17/18 1030  cefTRIAXone (ROCEPHIN) 1 g in sodium chloride 0.9 % 100 mL IVPB     1 g 200 mL/hr over 30 Minutes Intravenous  Once 02/17/18 1022 02/17/18 1130   02/17/18 0830  cefTRIAXone (ROCEPHIN) 1 g in sodium chloride 0.9 % 100 mL IVPB  Status:  Discontinued     1 g 200 mL/hr over 30 Minutes Intravenous Every 24 hours 02/17/18 0828 02/17/18 0851   02/17/18 0000  vancomycin (VANCOCIN) IVPB 1000 mg/200 mL premix  Status:  Discontinued     1,000 mg 200 mL/hr over 60 Minutes Intravenous every 72 hours 02/16/18 0112 02/16/18 0906   02/16/18 1800  meropenem (MERREM) 500 mg in sodium chloride 0.9 % 100 mL IVPB  Status:  Discontinued     500 mg 200 mL/hr over 30 Minutes Intravenous Every 24 hours 02/15/18 1600 02/17/18 0828   02/16/18 0045  vancomycin (VANCOCIN) IVPB 750 mg/150 ml premix     750 mg 150 mL/hr over 60 Minutes Intravenous  Once 02/16/18 0034 02/16/18 0156   02/15/18 1400  meropenem (MERREM) 500 mg in sodium chloride 0.9 % 100 mL IVPB  Status:  Discontinued     500 mg 200 mL/hr over 30 Minutes Intravenous Every 24 hours 02/15/18 1336 02/15/18 1600   02/14/18 2130  piperacillin-tazobactam (ZOSYN) IVPB 3.375 g  Status:   Discontinued     3.375 g 12.5 mL/hr over 240 Minutes Intravenous Every 12 hours 02/14/18 2123 02/15/18 1326   02/14/18 1945  cefTRIAXone (ROCEPHIN) 1 g in sodium chloride 0.9 % 100 mL IVPB     1 g 200 mL/hr over 30 Minutes Intravenous  Once 02/14/18 1937 02/14/18 2025       Assessment/Plan  Acute Cholecystitis  Cathy Cline is a 62 y.o. female with acute cholecystitis and leukocytosis likely attributable to steroid administration in the setting of respiratory distress and acute renal failure requiring hemodialysis which is complicated by pertinent comorbidities including DM, HTN, HLD, and history of MI.     - Okay for diet as tolerates   - Monitor abdominal examination and on-going bowel function   - Pain control as needed   - Continue IV ABx, okay to transition to PO once tolerating diet. She will  need to complete 2 weeks of ABx at time of discharge.    - Monitor WBC, leukocytosis likely from steroid use   - No indication for emergent surgical intervention or percutaneous cholecystostomy tube placement. If her condition were to acute worsen in relation to her gallbladder could consider tube placement.     - General surgery will sign off, please re-consult if new issues arise. Patient will follow up outpatient with Dr. Fredirick Cline and will need a 2 week course of ABx  -- Edison Simon , PA-C [facility] 02/20/2018, 11:53 AM [phone] M-F: 7am - 4pm  I saw and evaluated the patient.  I agree with the above documentation, exam, and plan, which I have edited where appropriate. Cathy Cline  1:24 PM

## 2018-02-20 NOTE — Progress Notes (Signed)
Pre HD Treatment    02/20/18 1915  Vital Signs  Temp 97.9 F (36.6 C)  Temp Source Oral  Pulse Rate 95  Pulse Rate Source Monitor  Resp 20  BP 121/76  BP Location Left Arm  BP Method Automatic  Patient Position (if appropriate) Lying  Oxygen Therapy  SpO2 97 %  O2 Device Nasal Cannula  O2 Flow Rate (L/min) 2 L/min  Pain Assessment  Pain Scale 0-10  Pain Score 0  Dialysis Weight  Weight  (Unable to weigh patient)  Type of Weight Pre-Dialysis  Time-Out for Hemodialysis  What Procedure? HD  Pt Identifiers(min of two) First/Last Name;MRN/Account#;Pt's DOB(use if MRN/Acct# not available  Correct Site? Yes  Correct Side? Yes  Correct Procedure? Yes  Consents Verified? Yes  Rad Studies Available? N/A  Safety Precautions Reviewed? Yes  Engineer, civil (consulting) Number 3  Station Number 3  UF/Alarm Test Passed  Conductivity: Meter 14  Conductivity: Machine  13.8  pH 7.4  Reverse Osmosis Main  Normal Saline Lot Number [id number]  Dialyzer Lot Number [id number]  Disposable Set Lot Number [id number]  Machine Temperature 98.6 F (37 C)  Musician and Audible Yes  Blood Lines Intact and Secured Yes  Pre Treatment Patient Checks  Vascular access used during treatment Catheter  Hepatitis B Surface Antigen Results Negative  Date Hepatitis B Surface Antigen Drawn 02/17/18  Isolation Initiated Yes  Hepatitis B Surface Antibody  (>10)  Date Hepatitis B Surface Antibody Drawn 02/17/18  Hemodialysis Consent Verified Yes  Hemodialysis Standing Orders Initiated Yes  ECG (Telemetry) Monitor On Yes  Prime Ordered Normal Saline  Length of  DialysisTreatment -hour(s) 3 Hour(s)  Dialysis Treatment Comments Na 140  Dialyzer Elisio 17H NR  Dialysate 4K  Variable Sodium Other (Comment)  Dialysis Anticoagulant None  Dialysate Flow Ordered 600  Blood Flow Rate Ordered 300 mL/min  Ultrafiltration Goal 0.5 Liters  Dialysis Blood Pressure Support Ordered Normal Saline  Education / Care  Plan  Dialysis Education Provided Yes  Documented Education in Care Plan Yes  Hemodialysis Catheter Left Internal jugular Triple-lumen  Placement Date/Time: 02/17/18 0630   Placed prior to admission: No  Time Out: Correct patient  Person Inserting Catheter: Dr. Soyla Murphy  Orientation: Left  Access Location: Internal jugular  Hemodialysis Catheter Type: Triple-lumen  Site Condition No complications  Blue Lumen Status Capped (Central line)  Red Lumen Status Capped (Central line)

## 2018-02-20 NOTE — Progress Notes (Signed)
Post HD Treatment  Pt tolerated treatment well. Net UF was 500 mL and BVP was 50.9. All blood was returned to patient. Report called to [place], RN.     02/20/18 2230  Hand-Off documentation  Report given to (Full Name) Donnie Mesa, RN  Vital Signs  Temp 98 F (36.7 C)  Temp Source Oral  Pulse Rate 77  Pulse Rate Source Monitor  Resp 15  BP 114/77  BP Location Left Arm  BP Method Automatic  Patient Position (if appropriate) Lying  Oxygen Therapy  SpO2 100 %  O2 Device Nasal Cannula  O2 Flow Rate (L/min) 2 L/min  Dialysis Weight  Weight  (unable to weigh)  Type of Weight Post-Dialysis  Post-Hemodialysis Assessment  Rinseback Volume (mL) 250 mL  KECN 197 V  Dialyzer Clearance Lightly streaked  Duration of HD Treatment -hour(s) 3 hour(s)  Hemodialysis Intake (mL) 500 mL  UF Total -Machine (mL) 1000 mL  Net UF (mL) 500 mL  Tolerated HD Treatment Yes  Hemodialysis Catheter Left Internal jugular Triple-lumen  Placement Date/Time: 02/17/18 0630   Placed prior to admission: No  Time Out: Correct patient  Person Inserting Catheter: Dr. Soyla Murphy  Orientation: Left  Access Location: Internal jugular  Hemodialysis Catheter Type: Triple-lumen  Site Condition No complications  Blue Lumen Status Capped (Central line)  Red Lumen Status Capped (Central line)  Purple Lumen Status N/A  Catheter fill solution Heparin 1000 units/ml  Catheter fill volume (Arterial) 1.4 cc  Catheter fill volume (Venous) 1.4  Dressing Type Biopatch;Occlusive  Dressing Status Clean;Dry;Intact  Drainage Description None  Post treatment catheter status Capped and Clamped

## 2018-02-20 NOTE — Progress Notes (Signed)
HD Treatment Initiated    02/20/18 1929  Vital Signs  Pulse Rate 93  Pulse Rate Source Monitor  Resp (!) 23  BP 124/86  BP Location Left Arm  BP Method Automatic  Patient Position (if appropriate) Lying  Oxygen Therapy  SpO2 100 %  O2 Device Nasal Cannula  O2 Flow Rate (L/min) 2 L/min  During Hemodialysis Assessment  Blood Flow Rate (mL/min) 300 mL/min  Arterial Pressure (mmHg) -80 mmHg  Venous Pressure (mmHg) 70 mmHg  Transmembrane Pressure (mmHg) 60 mmHg  Ultrafiltration Rate (mL/min) 320 mL/min  Dialysate Flow Rate (mL/min) 600 ml/min  Conductivity: Machine  13.8  HD Safety Checks Performed Yes  Dialysis Fluid Bolus Normal Saline  Bolus Amount (mL) 250 mL  Intra-Hemodialysis Comments Tx initiated;Progressing as prescribed  Hemodialysis Catheter Left Internal jugular Triple-lumen  Placement Date/Time: 02/17/18 0630   Placed prior to admission: No  Time Out: Correct patient  Person Inserting Catheter: Dr. Soyla Murphy  Orientation: Left  Access Location: Internal jugular  Hemodialysis Catheter Type: Triple-lumen  Blue Lumen Status Infusing  Red Lumen Status Infusing

## 2018-02-20 NOTE — Progress Notes (Signed)
[place] [facility] [place] NAME: Cathy Cline    MR#:  [id number]  DATE OF BIRTH:  [birth year]  SUBJECTIVE:  CHIEF COMPLAINT:   Chief Complaint  Patient presents with  . Abnormal Lab  Patient seen and evaluated today Conversation done with the help of interpreter Family is at bedside No abdominal pain Sitting comfortably on recliner Comfortable on oxygen by nasal cannula Weaned off IV insulin drip  REVIEW OF SYSTEMS:    ROS  CONSTITUTIONAL: No documented fever. Has fatigue, weakness. No weight gain, no weight loss.  EYES: No blurry or double vision.  ENT: No tinnitus. No postnasal drip. No redness of the oropharynx.  RESPIRATORY: No cough, no wheeze, no hemoptysis. Has dyspnea.  CARDIOVASCULAR: No chest pain. No orthopnea. No palpitations. No syncope.  GASTROINTESTINAL: Decreased nausea, decreased vomiting , no diarrhea.  Decreased abdominal pain. No melena or hematochezia.  GENITOURINARY: No dysuria or hematuria.  ENDOCRINE: No polyuria or nocturia. No heat or cold intolerance.  HEMATOLOGY: No anemia. No bruising. No bleeding.  INTEGUMENTARY: No rashes. No lesions.  MUSCULOSKELETAL: No arthritis. No swelling. No gout.  NEUROLOGIC: No numbness, tingling, or ataxia. No seizure-type activity.  PSYCHIATRIC: No anxiety. No insomnia. No ADD.   DRUG ALLERGIES:  No Known Allergies  VITALS:  Blood pressure 133/83, pulse 93, temperature 97.8 F (36.6 C), temperature source Oral, resp. rate 19, height 4\' 11"  (1.499 m), weight 59.2 kg, SpO2 93 %.  PHYSICAL EXAMINATION:   Physical Exam  GENERAL:  62 y.o.-year-old patient lying in the bed with no acute distress.  EYES: Pupils equal, round, reactive to light and accommodation. No scleral icterus. Extraocular muscles intact.  HEENT: Head atraumatic, normocephalic. Oropharynx dry and nasopharynx clear.  NECK:  Supple, no jugular venous distention. No thyroid enlargement, no tenderness.   LUNGS: Improved breath sounds bilaterally, bilateral rhonchi heard. No use of accessory muscles of respiration.  Off BiPAP CARDIOVASCULAR: S1, S2 tachycardia noted. No murmurs, rubs, or gallops.  ABDOMEN: Soft , periumbilical tenderness decreased, nondistended. Bowel sounds present. No organomegaly or mass.  EXTREMITIES: No cyanosis, clubbing or edema b/l.    NEUROLOGIC: Cranial nerves II through XII are intact. No focal Motor or sensory deficits b/l.   PSYCHIATRIC: The patient is alert and oriented x 3.  SKIN: No obvious rash, lesion, or ulcer.   LABORATORY PANEL:   CBC Recent Labs  Lab 02/20/18 0416  WBC 20.4*  HGB 9.4*  HCT 29.1*  PLT 76*   ------------------------------------------------------------------------------------------------------------------ Chemistries  Recent Labs  Lab 02/19/18 0417  02/20/18 0416  NA 138   < > 139  K 3.3*   < > 3.5  CL 97*   < > 97*  CO2 30   < > 28  GLUCOSE 188*   < > 129*  BUN 60*   < > 81*  CREATININE 3.47*   < > 4.03*  CALCIUM 8.2*   < > 8.1*  MG 2.1  --   --   AST 149*  --  78*  ALT 118*  --  78*  ALKPHOS 212*  --  159*  BILITOT 0.5  --  0.7   < > = values in this interval not displayed.   ------------------------------------------------------------------------------------------------------------------  Cardiac Enzymes No results for input(s): TROPONINI in the last 168 hours. ------------------------------------------------------------------------------------------------------------------  RADIOLOGY:  Dg Abd 1 View  Result Date: 02/19/2018 CLINICAL DATA:  Abdominal distension EXAM: ABDOMEN - 1 VIEW COMPARISON:  CT 02/16/2018, chest x-ray 02/19/2018 FINDINGS:  Vascular catheter tip over the right atrium. Small pleural effusions and basilar consolidations. Bowel gas pattern is nonobstructed. Round calcification in the right upper quadrant corresponds to a gallstone. IMPRESSION: Nonobstructed bowel-gas pattern.  Gallstone.  Electronically Signed   By: Donavan Foil M.D.   On: 02/19/2018 18:27   Dg Chest Port 1 View  Result Date: 02/20/2018 CLINICAL DATA:  62 year old female with congestive heart failure. EXAM: PORTABLE CHEST 1 VIEW COMPARISON:  02/19/2018 and earlier. FINDINGS: Portable AP upright view at 0553 hours. Stable left IJ approach central line. Stable lung volumes. Veiling bibasilar opacity with confluent retrocardiac opacity. Stable cardiac size and mediastinal contours. Persistent increased pulmonary vascular congestion. No pneumothorax. Negative visible bowel gas pattern. IMPRESSION: 1. Persistent pulmonary vascular congestion suggesting interstitial edema. 2. Continued bilateral pleural effusions and lower lobe collapse or consolidation. Electronically Signed   By: Genevie Ann M.D.   On: 02/20/2018 07:40   Dg Chest Port 1 View  Result Date: 02/19/2018 CLINICAL DATA:  Pneumonia. EXAM: PORTABLE CHEST 1 VIEW COMPARISON:  02/18/2018 FINDINGS: Left jugular catheter terminates at the superior cavoatrial junction. The cardiomediastinal silhouette is unchanged. Pulmonary vascular congestion and diffuse interstitial opacities are similar to the prior study. Veiling opacities in both lung bases are unchanged and consistent with small pleural effusions. Bibasilar airspace opacities likely reflect atelectasis although pneumonia is not excluded. No pneumothorax is identified. IMPRESSION: Unchanged pulmonary edema and pleural effusions. Electronically Signed   By: Logan Bores M.D.   On: 02/19/2018 08:54     ASSESSMENT AND PLAN:   62 year old female patient with with history of hypertension, hyperlipidemia, type 2 diabetes mellitus currently in ICU for sepsis and renal failure   -Septic shock secondary to urosepsis resolved Continue IV fluids  Off IV pressor medication Close input output monitoring  -Sepsis secondary to pyelonephritis from E. coli IV fluids and antibiotics Continue IV Rocephin  antibiotic  -Hyperglycemia improved Weaned insulin drip Probably secondary steroids  -Metabolic acidosis improved  -Acute pulmonary edema and volume overload Dialysis to remove excess fluid as per schedule  -E. coli urinary tract infection improving  -Status post respiratory failure Off BiPAP Continue oxygen via nasal cannula  -Acute kidney injury and renal failure secondary to ATN from sepsis Dialysis if needed Nephrology follow-up appreciated  -Acute hyperkalemia resolved  -Cholecystitis Continue IV antibiotics and follow-up LFTs No surgical intervention as of now as per surgical consultant HIDA scan suggestive of nonvisualization of gallbladder consistent with cholecystitis In view of sepsis, renal failure and respiratory failure no surgery recommended at this point Continue IV antibiotics Percutaneous cholecystostomy tube also not recommended at this time  -Leukocytosis secondary to IV steroids Control blood sugars Wean steroids Continue IV antibiotics  -Anemia Monitor hemoglobin hematocrit  -Atelectasis and pneumonia Continue IV meropenem antibiotic Zosyn and vancomycin  -Hypocalcemia Replace electrolytes  -Transfer to medical floor today once stable and bed is available  All the records are reviewed and case discussed with Care Management/Social Worker. Management plans discussed with the patient, family and they are in agreement.  CODE STATUS: Full code  DVT Prophylaxis: SCDs  TOTAL TIME TAKING CARE OF THIS PATIENT: 34 minutes.   POSSIBLE D/C IN  3 to 4 DAYS, DEPENDING ON CLINICAL CONDITION.  Saundra Shelling M.D on 02/20/2018 at 1:29 PM  Between 7am to 6pm - Pager - [phone]  After 6pm go to [email or web address] - password [email or web address] [facility]  Office  [phone]  CC: Primary care physician; Petra Kuba, MD  Note: This  dictation was prepared with Dragon dictation along with smaller phrase technology. Any  transcriptional errors that result from this process are unintentional.

## 2018-02-21 LAB — BASIC METABOLIC PANEL
Anion gap: 14 (ref 5–15)
BUN: 60 mg/dL — ABNORMAL HIGH (ref 8–23)
CO2: 26 mmol/L (ref 22–32)
Calcium: 7.9 mg/dL — ABNORMAL LOW (ref 8.9–10.3)
Chloride: 96 mmol/L — ABNORMAL LOW (ref 98–111)
Creatinine, Ser: 3.53 mg/dL — ABNORMAL HIGH (ref 0.44–1.00)
GFR calc non Af Amer: 13 mL/min — ABNORMAL LOW (ref >60)
GFR, EST AFRICAN AMERICAN: 15 mL/min — AB (ref >60)
Glucose, Bld: 187 mg/dL — ABNORMAL HIGH (ref 70–99)
Potassium: 4.4 mmol/L (ref 3.5–5.1)
Sodium: 136 mmol/L (ref 135–145)

## 2018-02-21 LAB — GLUCOSE, CAPILLARY
Glucose-Capillary: 121 mg/dL — ABNORMAL HIGH (ref 70–99)
Glucose-Capillary: 153 mg/dL — ABNORMAL HIGH (ref 70–99)
Glucose-Capillary: 195 mg/dL — ABNORMAL HIGH (ref 70–99)
Glucose-Capillary: 211 mg/dL — ABNORMAL HIGH (ref 70–99)

## 2018-02-21 MED ORDER — TUBERCULIN PPD 5 UNIT/0.1ML ID SOLN
5.0000 [IU] | Freq: Once | INTRADERMAL | Status: AC
  Administered 2018-02-21: 5 [IU] via INTRADERMAL
  Filled 2018-02-21: qty 0.1

## 2018-02-21 MED ORDER — AMOXICILLIN-POT CLAVULANATE 500-125 MG PO TABS
1.0000 | ORAL_TABLET | Freq: Every day | ORAL | Status: DC
  Administered 2018-02-21 – 2018-02-23 (×3): 500 mg via ORAL
  Filled 2018-02-21 (×4): qty 1

## 2018-02-21 NOTE — Progress Notes (Signed)
[place], [place] 02/21/18  Subjective:  Patient seen with aid of an interpreter. No urine output yesterday. She completed hemodialysis yesterday.  Objective:  Vital signs in last 24 hours:  Temp:  [97.9 F (36.6 C)-98.3 F (36.8 C)] 98 F (36.7 C) (12/03 1136) Pulse Rate:  [73-95] 87 (12/03 1136) Resp:  [15-24] 16 (12/03 1136) BP: (98-174)/(68-93) 127/74 (12/03 1136) SpO2:  [94 %-100 %] 97 % (12/03 1136) Weight:  [57.8 kg] 57.8 kg (12/03 0028)  Weight change:  Filed Weights   02/18/18 0945 02/18/18 1247 02/21/18 0028  Weight: 61 kg 59.2 kg 57.8 kg    Intake/Output:    Intake/Output Summary (Last 24 hours) at 02/21/2018 1325 Last data filed at 02/21/2018 0900 Gross per 24 hour  Intake 120 ml  Output 500 ml  Net -380 ml     Physical Exam: General:  Critically ill-appearing  HEENT  dry mouth  Neck  supple  Pulm/lungs  CTAB,  Normal effort  CVS/Heart  regular, S1S2  Abdomen:   Mild, diffuse tenderness, soft  Extremities:  no edema  Neurologic:  Alert, able to interact and answer questions  Skin:  Normal turgor, no acute rashes  Access:  L IJ temp HD catheter       Basic Metabolic Panel:  Recent Labs  Lab 02/17/18 0458  02/17/18 2028  02/18/18 0845 02/18/18 1643 02/19/18 0417 02/19/18 1603 02/20/18 0416 02/21/18 1140  NA 138   < > 139   < >  --  137 138 136 139 136  K 3.2*   < > 4.4   < >  --  3.9 3.3* 3.6 3.5 4.4  CL 91*   < > 91*   < >  --  93* 97* 94* 97* 96*  CO2 22   < > 28   < >  --  29 30 28 28 26   GLUCOSE 177*   < > 193*   < >  --  335* 188* 217* 129* 187*  BUN 85*   < > 59*   < >  --  46* 60* 75* 81* 60*  CREATININE 5.31*   < > 4.12*   < >  --  3.12* 3.47* 3.81* 4.03* 3.53*  CALCIUM 6.2*   < > 7.6*   < >  --  7.9* 8.2* 8.2* 8.1* 7.9*  MG 1.6*  --  2.0  --  2.1 2.0 2.1  --   --   --   PHOS 6.8*  --  5.0*  --  6.5* 4.2 4.0  --   --   --    < > = values in this interval not displayed.     CBC: Recent Labs   Lab 02/16/18 2058 02/17/18 0458 02/18/18 0354 02/19/18 0417 02/20/18 0416  WBC 26.2* 19.4* 19.0* 25.1* 20.4*  NEUTROABS 23.7* 17.8* 17.9* 23.5* 17.9*  HGB 8.0* 7.6* 8.9* 9.1* 9.4*  HCT 24.1* 22.7* 26.8* 27.6* 29.1*  MCV 81.7 80.8 81.0 80.9 82.2  PLT 172 157 119* 88* 76*      Lab Results  Component Value Date   HEPBSAG Negative 02/17/2018   HEPBSAB Non Reactive 02/17/2018      Microbiology:  Recent Results (from the past 240 hour(s))  Urine culture     Status: Abnormal   Collection Time: 02/14/18  6:44 PM  Result Value Ref Range Status   Specimen Description   Final    URINE, RANDOM Performed at [facility], [address] [place], [place] [address]    Special Requests   Final    NONE Performed at [facility], [address] [place], [place] [address]    Culture >=100,000 COLONIES/mL ESCHERICHIA COLI (A)  Final   Report Status 02/17/2018 FINAL  Final   Organism ID, Bacteria ESCHERICHIA COLI (A)  Final      Susceptibility   Escherichia coli - MIC*    AMPICILLIN <=2 SENSITIVE Sensitive     CEFAZOLIN <=4 SENSITIVE Sensitive     CEFTRIAXONE <=1 SENSITIVE Sensitive     CIPROFLOXACIN <=0.25 SENSITIVE Sensitive     GENTAMICIN <=1 SENSITIVE Sensitive     IMIPENEM <=0.25 SENSITIVE Sensitive     NITROFURANTOIN <=16 SENSITIVE Sensitive     TRIMETH/SULFA <=20 SENSITIVE Sensitive     AMPICILLIN/SULBACTAM <=2 SENSITIVE Sensitive     PIP/TAZO <=4 SENSITIVE Sensitive     Extended ESBL NEGATIVE Sensitive     * >=100,000 COLONIES/mL ESCHERICHIA COLI  MRSA PCR Screening     Status: None   Collection Time: 02/15/18  5:13 PM  Result Value Ref Range Status   MRSA by PCR NEGATIVE NEGATIVE Final    Comment:        The GeneXpert MRSA Assay (FDA approved for NASAL specimens only), is one component of a comprehensive MRSA colonization surveillance program. It is not intended to diagnose MRSA infection nor to guide or monitor treatment for MRSA  infections. Performed at [facility], [address] [place], [place] [address]   CULTURE, BLOOD (ROUTINE X 2) w Reflex to ID Panel     Status: None   Collection Time: 02/15/18  7:10 PM  Result Value Ref Range Status   Specimen Description BLOOD LEFT ANTECUBITAL  Final   Special Requests   Final    BOTTLES DRAWN AEROBIC AND ANAEROBIC Blood Culture adequate volume   Culture   Final    NO GROWTH 5 DAYS Performed at [facility], [address] [place], [place] [address]    Report Status 02/20/2018 FINAL  Final  CULTURE, BLOOD (ROUTINE X 2) w Reflex to ID Panel     Status: None   Collection Time: 02/15/18  7:25 PM  Result Value Ref Range Status   Specimen Description BLOOD BLOOD RIGHT HAND  Final   Special Requests   Final    BOTTLES DRAWN AEROBIC AND ANAEROBIC Blood Culture adequate volume   Culture   Final    NO GROWTH 5 DAYS Performed at [facility], [address] [place], [place] [address]    Report Status 02/20/2018 FINAL  Final  Urine Culture     Status: None   Collection Time: 02/15/18  8:10 PM  Result Value Ref Range Status   Specimen Description   Final    URINE, CATHETERIZED Performed at [facility], [address] [place], [place] [address]    Special Requests   Final    NONE Performed at [facility], [address] [place], [place] [address]    Culture   Final    NO GROWTH Performed at [facility], [place] [address], [place], [place] [address]    Report Status 02/17/2018 FINAL  Final  C difficile quick scan w PCR reflex     Status: None   Collection Time: 02/16/18  5:20 AM  Result Value Ref Range Status   C Diff antigen NEGATIVE NEGATIVE Final   C Diff toxin NEGATIVE NEGATIVE Final   C  Diff interpretation No C. difficile detected.  Final    Comment: Performed at [facility], [address] [place], [place] [address]    Coagulation Studies: No results for input(s): LABPROT, INR in  the last 72 hours.  Urinalysis: No results for input(s): COLORURINE, LABSPEC, PHURINE, GLUCOSEU, HGBUR, BILIRUBINUR, KETONESUR, PROTEINUR, UROBILINOGEN, NITRITE, LEUKOCYTESUR in the last 72 hours.  Invalid input(s): APPERANCEUR    Imaging: Dg Abd 1 View  Result Date: 02/19/2018 CLINICAL DATA:  Abdominal distension EXAM: ABDOMEN - 1 VIEW COMPARISON:  CT 02/16/2018, chest x-ray 02/19/2018 FINDINGS: Vascular catheter tip over the right atrium. Small pleural effusions and basilar consolidations. Bowel gas pattern is nonobstructed. Round calcification in the right upper quadrant corresponds to a gallstone. IMPRESSION: Nonobstructed bowel-gas pattern.  Gallstone. Electronically Signed   By: Donavan Foil M.D.   On: 02/19/2018 18:27   Dg Chest Port 1 View  Result Date: 02/20/2018 CLINICAL DATA:  62 year old female with congestive heart failure. EXAM: PORTABLE CHEST 1 VIEW COMPARISON:  02/19/2018 and earlier. FINDINGS: Portable AP upright view at 0553 hours. Stable left IJ approach central line. Stable lung volumes. Veiling bibasilar opacity with confluent retrocardiac opacity. Stable cardiac size and mediastinal contours. Persistent increased pulmonary vascular congestion. No pneumothorax. Negative visible bowel gas pattern. IMPRESSION: 1. Persistent pulmonary vascular congestion suggesting interstitial edema. 2. Continued bilateral pleural effusions and lower lobe collapse or consolidation. Electronically Signed   By: Genevie Ann M.D.   On: 02/20/2018 07:40     Medications:   . sodium chloride Stopped (02/20/18 1900)   . amoxicillin-clavulanate  1 tablet Oral QHS  . aspirin EC  81 mg Oral Daily  . docusate sodium  100 mg Oral BID  . feeding supplement (NEPRO CARB STEADY)  237 mL Oral Q24H  . insulin aspart  0-15 Units Subcutaneous TID WC  . insulin aspart  0-5 Units Subcutaneous QHS  . loratadine  10 mg Oral Daily  . mouth rinse  15 mL Mouth Rinse q12n4p  . metoprolol tartrate  25 mg Oral BID   . pantoprazole  40 mg Oral Daily  . senna  1 tablet Oral QHS  . sodium chloride flush  10-40 mL Intracatheter Q12H   sodium chloride, acetaminophen **OR** acetaminophen, ipratropium-albuterol, labetalol, lactulose, ondansetron **OR** ondansetron (ZOFRAN) IV, sodium chloride flush  Assessment/ Plan:  62 y.o.Hispanic female with diabetes, hypertension, coronary atherosclerosis, h/o MI, h/o coronary angiography 2017, followed by Dr Nehemiah Massed was admitted on 02/14/2018 with Acute pyelonephritis.   1.  Acute renal failure, ATN from severe sepsis.  No recent creatinine.  It was 0.80 from July 2015 2.  Hyperkalemia, now hypokalemia 3.  Acute pyelonephritis, sepsis (E. coli, urinary source) 4.  Metabolic Acidosis 5.  Acute pulmonary edema and volume overload 6.  DM-2 with CKD. HbA1c 9.4 % on 02/14/2018 7.  CKD, unspec. H/o proteinuria since 2015   ? If patient had delayed dialyzer reaction. Improved with Solumedrol and iv benadryl Patient remains oliguric.   Plan: The patient continues to have severe acute renal failure.  She remains dialysis dependent at the moment.  No urine output yesterday.  We will plan for additional dialysis treatment tomorrow.  She may end up requiring outpatient hemodialysis as well.  We will go in order hepatitis B serologies as well as place PPD.  Further plan as patient progresses.   LOS: 7 Coraline Talwar 12/3/20191:25 PM  [place], [place]  Note: This note was prepared with Dragon dictation. Any transcription errors  are unintentional

## 2018-02-21 NOTE — Progress Notes (Signed)
Pt tx to RM 222

## 2018-02-21 NOTE — Progress Notes (Signed)
[place] [facility] [place] NAME: Cathy Cline    MR#:  [id number]  DATE OF BIRTH:  [birth year]  SUBJECTIVE:  CHIEF COMPLAINT:   Chief Complaint  Patient presents with  . Abnormal Lab  Patient seen and evaluated today Conversation done with the help of family members Family is at bedside No abdominal pain Comfortable on oxygen by nasal cannula Weaned off IV insulin drip currently on medical floor Patient has decreased urine output  REVIEW OF SYSTEMS:    ROS  CONSTITUTIONAL: No documented fever. Has fatigue, weakness. No weight gain, no weight loss.  EYES: No blurry or double vision.  ENT: No tinnitus. No postnasal drip. No redness of the oropharynx.  RESPIRATORY: No cough, no wheeze, no hemoptysis. Has dyspnea.  CARDIOVASCULAR: No chest pain. No orthopnea. No palpitations. No syncope.  GASTROINTESTINAL: Decreased nausea, decreased vomiting , no diarrhea.  Decreased abdominal pain. No melena or hematochezia.  GENITOURINARY: No dysuria or hematuria.  ENDOCRINE: No polyuria or nocturia. No heat or cold intolerance.  HEMATOLOGY: No anemia. No bruising. No bleeding.  INTEGUMENTARY: No rashes. No lesions.  MUSCULOSKELETAL: No arthritis. No swelling. No gout.  NEUROLOGIC: No numbness, tingling, or ataxia. No seizure-type activity.  PSYCHIATRIC: No anxiety. No insomnia. No ADD.   DRUG ALLERGIES:  No Known Allergies  VITALS:  Blood pressure 127/74, pulse 87, temperature 98 F (36.7 C), temperature source Oral, resp. rate 16, height 4\' 11"  (1.499 m), weight 57.8 kg, SpO2 97 %.  PHYSICAL EXAMINATION:   Physical Exam  GENERAL:  62 y.o.-year-old patient lying in the bed with no acute distress.  EYES: Pupils equal, round, reactive to light and accommodation. No scleral icterus. Extraocular muscles intact.  HEENT: Head atraumatic, normocephalic. Oropharynx dry and nasopharynx clear.  NECK:  Supple, no jugular venous distention. No  thyroid enlargement, no tenderness.  LUNGS: Improved breath sounds bilaterally, bilateral rhonchi heard. No use of accessory muscles of respiration.  Off BiPAP CARDIOVASCULAR: S1, S2 tachycardia noted. No murmurs, rubs, or gallops.  ABDOMEN: Soft , periumbilical tenderness decreased, nondistended. Bowel sounds present. No organomegaly or mass.  EXTREMITIES: No cyanosis, clubbing or edema b/l.    NEUROLOGIC: Cranial nerves II through XII are intact. No focal Motor or sensory deficits b/l.   PSYCHIATRIC: The patient is alert and oriented x 3.  SKIN: No obvious rash, lesion, or ulcer.   LABORATORY PANEL:   CBC Recent Labs  Lab 02/20/18 0416  WBC 20.4*  HGB 9.4*  HCT 29.1*  PLT 76*   ------------------------------------------------------------------------------------------------------------------ Chemistries  Recent Labs  Lab 02/19/18 0417  02/20/18 0416 02/21/18 1140  NA 138   < > 139 136  K 3.3*   < > 3.5 4.4  CL 97*   < > 97* 96*  CO2 30   < > 28 26  GLUCOSE 188*   < > 129* 187*  BUN 60*   < > 81* 60*  CREATININE 3.47*   < > 4.03* 3.53*  CALCIUM 8.2*   < > 8.1* 7.9*  MG 2.1  --   --   --   AST 149*  --  78*  --   ALT 118*  --  78*  --   ALKPHOS 212*  --  159*  --   BILITOT 0.5  --  0.7  --    < > = values in this interval not displayed.   ------------------------------------------------------------------------------------------------------------------  Cardiac Enzymes No results for input(s): TROPONINI in the last 168  hours. ------------------------------------------------------------------------------------------------------------------  RADIOLOGY:  Dg Abd 1 View  Result Date: 02/19/2018 CLINICAL DATA:  Abdominal distension EXAM: ABDOMEN - 1 VIEW COMPARISON:  CT 02/16/2018, chest x-ray 02/19/2018 FINDINGS: Vascular catheter tip over the right atrium. Small pleural effusions and basilar consolidations. Bowel gas pattern is nonobstructed. Round calcification in the  right upper quadrant corresponds to a gallstone. IMPRESSION: Nonobstructed bowel-gas pattern.  Gallstone. Electronically Signed   By: Donavan Foil M.D.   On: 02/19/2018 18:27   Dg Chest Port 1 View  Result Date: 02/20/2018 CLINICAL DATA:  62 year old female with congestive heart failure. EXAM: PORTABLE CHEST 1 VIEW COMPARISON:  02/19/2018 and earlier. FINDINGS: Portable AP upright view at 0553 hours. Stable left IJ approach central line. Stable lung volumes. Veiling bibasilar opacity with confluent retrocardiac opacity. Stable cardiac size and mediastinal contours. Persistent increased pulmonary vascular congestion. No pneumothorax. Negative visible bowel gas pattern. IMPRESSION: 1. Persistent pulmonary vascular congestion suggesting interstitial edema. 2. Continued bilateral pleural effusions and lower lobe collapse or consolidation. Electronically Signed   By: Genevie Ann M.D.   On: 02/20/2018 07:40     ASSESSMENT AND PLAN:   62 year old female patient with with history of hypertension, hyperlipidemia, type 2 diabetes mellitus currently in ICU for sepsis and renal failure  -Acute kidney injury and renal failure secondary to ATN from sepsis Has oliguria Dialysis as needed as per nephrology Watch for improvement in renal function Nephrology follow-up appreciated  -Septic shock secondary to urosepsis resolved Continue IV fluids  Off IV pressor medications Transferred to medical floor yesterday alcohol course  -Sepsis secondary to pyelonephritis from E. coli Patient switched to oral antibiotics based on culture and sensitivity  -Hyperglycemia improved Weaned off insulin drip Probably secondary steroids  -Metabolic acidosis improved  -Acute pulmonary edema and volume overload Dialysis to remove excess fluid as per schedule  -E. coli urinary tract infection improving  -Status post respiratory failure Off BiPAP Continue oxygen via nasal cannula  -Acute hyperkalemia  resolved  -Cholecystitis Follow-up LFTs No surgical intervention as of now as per surgical consultant HIDA scan suggestive of nonvisualization of gallbladder consistent with cholecystitis In view of sepsis, renal failure and respiratory failure no surgery recommended at this point Continue antibiotic therapy Percutaneous cholecystostomy tube also not recommended at this time  -Leukocytosis secondary to steroids improving Control blood sugars Wean steroids Continue  antibiotics  -Anemia Monitor hemoglobin hematocrit  All the records are reviewed and case discussed with Care Management/Social Worker. Management plans discussed with the patient, family and they are in agreement.  CODE STATUS: Full code  DVT Prophylaxis: SCDs  TOTAL TIME TAKING CARE OF THIS PATIENT: 33 minutes.   POSSIBLE D/C IN  3 to 4 DAYS, DEPENDING ON CLINICAL CONDITION.  Saundra Shelling M.D on 02/21/2018 at 1:16 PM  Between 7am to 6pm - Pager - [phone]  After 6pm go to [email or web address] - password [email or web address] [facility]  Office  [phone]  CC: Primary care physician; Petra Kuba, MD  Note: This dictation was prepared with Dragon dictation along with smaller phrase technology. Any transcriptional errors that result from this process are unintentional.

## 2018-02-21 NOTE — Care Management (Signed)
Patient moved out of ICU 12.2.  CM spoke with nephrology to clarify plan for dialysis.  At present not sure if patient is end stage. She most likely will need dialysis at discharge. Hepatitis screens have resulted.Order is present for TB skin order is present. It is verbally reported that patient has sat for her dialysis  but all of the documentation on the flowsheet indicates patient in lying position during treatment. Discussed that it must be documented patient is sitting.  Discussed mobilizing patient

## 2018-02-22 DIAGNOSIS — E1129 Type 2 diabetes mellitus with other diabetic kidney complication: Secondary | ICD-10-CM

## 2018-02-22 DIAGNOSIS — I10 Essential (primary) hypertension: Secondary | ICD-10-CM

## 2018-02-22 DIAGNOSIS — E7800 Pure hypercholesterolemia, unspecified: Secondary | ICD-10-CM

## 2018-02-22 LAB — CBC
HCT: 25.3 % — ABNORMAL LOW (ref 36.0–46.0)
Hemoglobin: 8 g/dL — ABNORMAL LOW (ref 12.0–15.0)
MCH: 26.4 pg (ref 26.0–34.0)
MCHC: 31.6 g/dL (ref 30.0–36.0)
MCV: 83.5 fL (ref 80.0–100.0)
Platelets: 77 10*3/uL — ABNORMAL LOW (ref 150–400)
RBC: 3.03 MIL/uL — ABNORMAL LOW (ref 3.87–5.11)
RDW: 18.7 % — ABNORMAL HIGH (ref 11.5–15.5)
WBC: 21.6 10*3/uL — ABNORMAL HIGH (ref 4.0–10.5)
nRBC: 0 % (ref 0.0–0.2)

## 2018-02-22 LAB — BASIC METABOLIC PANEL
Anion gap: 11 (ref 5–15)
BUN: 72 mg/dL — ABNORMAL HIGH (ref 8–23)
CO2: 27 mmol/L (ref 22–32)
CREATININE: 4.04 mg/dL — AB (ref 0.44–1.00)
Calcium: 7.8 mg/dL — ABNORMAL LOW (ref 8.9–10.3)
Chloride: 97 mmol/L — ABNORMAL LOW (ref 98–111)
GFR calc Af Amer: 13 mL/min — ABNORMAL LOW (ref >60)
GFR calc non Af Amer: 11 mL/min — ABNORMAL LOW (ref >60)
Glucose, Bld: 289 mg/dL — ABNORMAL HIGH (ref 70–99)
Potassium: 4.5 mmol/L (ref 3.5–5.1)
Sodium: 135 mmol/L (ref 135–145)

## 2018-02-22 LAB — GLUCOSE, CAPILLARY
Glucose-Capillary: 277 mg/dL — ABNORMAL HIGH (ref 70–99)
Glucose-Capillary: 143 mg/dL — ABNORMAL HIGH (ref 70–99)
Glucose-Capillary: 200 mg/dL — ABNORMAL HIGH (ref 70–99)
Glucose-Capillary: 283 mg/dL — ABNORMAL HIGH (ref 70–99)

## 2018-02-22 LAB — PHOSPHORUS: Phosphorus: 4.5 mg/dL (ref 2.5–4.6)

## 2018-02-22 LAB — HEPATITIS B SURFACE ANTIGEN: HEP B S AG: NEGATIVE

## 2018-02-22 LAB — HEPATITIS B SURFACE ANTIBODY,QUALITATIVE: Hep B S Ab: REACTIVE

## 2018-02-22 MED ORDER — OXYCODONE-ACETAMINOPHEN 5-325 MG PO TABS
1.0000 | ORAL_TABLET | Freq: Four times a day (QID) | ORAL | Status: DC | PRN
  Administered 2018-02-22 – 2018-02-23 (×3): 1 via ORAL
  Filled 2018-02-22 (×3): qty 1

## 2018-02-22 MED ORDER — NEPRO/CARBSTEADY PO LIQD
237.0000 mL | Freq: Two times a day (BID) | ORAL | Status: DC
  Administered 2018-02-22 – 2018-02-24 (×3): 237 mL via ORAL

## 2018-02-22 MED ORDER — ADULT MULTIVITAMIN W/MINERALS CH
1.0000 | ORAL_TABLET | Freq: Every day | ORAL | Status: DC
  Administered 2018-02-22: 1 via ORAL
  Filled 2018-02-22 (×2): qty 1

## 2018-02-22 MED ORDER — RENA-VITE PO TABS
1.0000 | ORAL_TABLET | Freq: Every day | ORAL | Status: DC
  Administered 2018-02-22 – 2018-02-23 (×2): 1 via ORAL
  Filled 2018-02-22 (×3): qty 1

## 2018-02-22 MED ORDER — VANCOMYCIN HCL IN DEXTROSE 1-5 GM/200ML-% IV SOLN
1000.0000 mg | INTRAVENOUS | Status: AC
  Administered 2018-02-23: 1000 mg via INTRAVENOUS
  Filled 2018-02-22: qty 200

## 2018-02-22 MED ORDER — INSULIN DETEMIR 100 UNIT/ML ~~LOC~~ SOLN
14.0000 [IU] | Freq: Two times a day (BID) | SUBCUTANEOUS | Status: DC
  Administered 2018-02-22: 14 [IU] via SUBCUTANEOUS
  Filled 2018-02-22 (×3): qty 0.14

## 2018-02-22 NOTE — Progress Notes (Signed)
[place], [place] 02/22/18  Subjective:  Patient seen at bedside. Seen with the aid of Spanish interpreter. Still has lower extremity edema. States that she is making some urine. Creatinine currently 4.04.  Objective:  Vital signs in last 24 hours:  Temp:  [97.6 F (36.4 C)-98.5 F (36.9 C)] 98.4 F (36.9 C) (12/04 0750) Pulse Rate:  [82-93] 82 (12/04 0750) Resp:  [16-24] 24 (12/04 0750) BP: (116-127)/(70-78) 118/70 (12/04 0750) SpO2:  [97 %-99 %] 98 % (12/04 0750)  Weight change:  Filed Weights   02/18/18 0945 02/18/18 1247 02/21/18 0028  Weight: 61 kg 59.2 kg 57.8 kg    Intake/Output:    Intake/Output Summary (Last 24 hours) at 02/22/2018 1051 Last data filed at 02/22/2018 0740 Gross per 24 hour  Intake 0 ml  Output 0 ml  Net 0 ml     Physical Exam: General:  No acute distress  HEENT  OM moist hearing intact  Neck  supple  Pulm/lungs  CTAB,  Normal effort  CVS/Heart  regular, S1S2  Abdomen:   Mild tenderness, no rebound, BS present  Extremities:  no edema  Neurologic:  Alert, follows commands, conversant  Skin:  Normal turgor, no acute rashes  Access:   L IJ temp HD catheter       Basic Metabolic Panel:  Recent Labs  Lab 02/17/18 0458  02/17/18 2028  02/18/18 0845 02/18/18 1643 02/19/18 0417 02/19/18 1603 02/20/18 0416 02/21/18 1140 02/22/18 0500  NA 138   < > 139   < >  --  137 138 136 139 136 135  K 3.2*   < > 4.4   < >  --  3.9 3.3* 3.6 3.5 4.4 4.5  CL 91*   < > 91*   < >  --  93* 97* 94* 97* 96* 97*  CO2 22   < > 28   < >  --  _0 GLUCOSE 177*   < > 193*   < >  --  335* 188* 217* 129* 187* 289*  BUN 85*   < > 59*   < >  --  46* 60* 75* 81* 60* 72*  CREATININE 5.31*   < > 4.12*   < >  --  3.12* 3.47* 3.81* 4.03* 3.53* 4.04*  CALCIUM 6.2*   < > 7.6*   < >  --  7.9* 8.2* 8.2* 8.1* 7.9* 7.8*  MG 1.6*  --  2.0  --  2.1 2.0 2.1  --   --   --   --   PHOS 6.8*  --  5.0*  --  6.5* 4.2 4.0  --   --   --    --    < > = values in this interval not displayed.     CBC: Recent Labs  Lab 02/16/18 2058 02/17/18 0458 02/18/18 0354 02/19/18 0417 02/20/18 0416 02/22/18 0500  WBC 26.2* 19.4* 19.0* 25.1* 20.4* 21.6*  NEUTROABS 23.7* 17.8* 17.9* 23.5* 17.9*  --   HGB 8.0* 7.6* 8.9* 9.1* 9.4* 8.0*  HCT 24.1* 22.7* 26.8* 27.6* 29.1* 25.3*  MCV 81.7 80.8 81.0 80.9 82.2 83.5  PLT 172 157 119* 88* 76* 77*      Lab Results  Component Value Date   HEPBSAG Negative 02/21/2018   HEPBSAB Reactive 02/21/2018      Microbiology:  Recent Results (from the past 240 hour(s))  Urine culture     Status: Abnormal  Collection Time: 02/14/18  6:44 PM  Result Value Ref Range Status   Specimen Description   Final    URINE, RANDOM Performed at [facility], [address] [place], [place] [address]    Special Requests   Final    NONE Performed at [facility], [address] [place], [place] [address]    Culture >=100,000 COLONIES/mL ESCHERICHIA COLI (A)  Final   Report Status 02/17/2018 FINAL  Final   Organism ID, Bacteria ESCHERICHIA COLI (A)  Final      Susceptibility   Escherichia coli - MIC*    AMPICILLIN <=2 SENSITIVE Sensitive     CEFAZOLIN <=4 SENSITIVE Sensitive     CEFTRIAXONE <=1 SENSITIVE Sensitive     CIPROFLOXACIN <=0.25 SENSITIVE Sensitive     GENTAMICIN <=1 SENSITIVE Sensitive     IMIPENEM <=0.25 SENSITIVE Sensitive     NITROFURANTOIN <=16 SENSITIVE Sensitive     TRIMETH/SULFA <=20 SENSITIVE Sensitive     AMPICILLIN/SULBACTAM <=2 SENSITIVE Sensitive     PIP/TAZO <=4 SENSITIVE Sensitive     Extended ESBL NEGATIVE Sensitive     * >=100,000 COLONIES/mL ESCHERICHIA COLI  MRSA PCR Screening     Status: None   Collection Time: 02/15/18  5:13 PM  Result Value Ref Range Status   MRSA by PCR NEGATIVE NEGATIVE Final    Comment:        The GeneXpert MRSA Assay (FDA approved for NASAL specimens only), is one component of a comprehensive MRSA  colonization surveillance program. It is not intended to diagnose MRSA infection nor to guide or monitor treatment for MRSA infections. Performed at [facility], [address] [place], [place] [address]   CULTURE, BLOOD (ROUTINE X 2) w Reflex to ID Panel     Status: None   Collection Time: 02/15/18  7:10 PM  Result Value Ref Range Status   Specimen Description BLOOD LEFT ANTECUBITAL  Final   Special Requests   Final    BOTTLES DRAWN AEROBIC AND ANAEROBIC Blood Culture adequate volume   Culture   Final    NO GROWTH 5 DAYS Performed at [facility], [address] [place], [place] [address]    Report Status 02/20/2018 FINAL  Final  CULTURE, BLOOD (ROUTINE X 2) w Reflex to ID Panel     Status: None   Collection Time: 02/15/18  7:25 PM  Result Value Ref Range Status   Specimen Description BLOOD BLOOD RIGHT HAND  Final   Special Requests   Final    BOTTLES DRAWN AEROBIC AND ANAEROBIC Blood Culture adequate volume   Culture   Final    NO GROWTH 5 DAYS Performed at [facility], [address] [place], [place] [address]    Report Status 02/20/2018 FINAL  Final  Urine Culture     Status: None   Collection Time: 02/15/18  8:10 PM  Result Value Ref Range Status   Specimen Description   Final    URINE, CATHETERIZED Performed at [facility], [address] [place], [place] [address]    Special Requests   Final    NONE Performed at [facility], [address], [place], [place] [address]    Culture   Final    NO GROWTH Performed at [facility], [place] [address], [place], [place] [address]    Report Status 02/17/2018 FINAL  Final  C difficile quick scan w PCR reflex     Status: None   Collection  Time: 02/16/18  5:20 AM  Result Value Ref Range Status   C Diff antigen NEGATIVE NEGATIVE Final   C Diff toxin NEGATIVE NEGATIVE Final   C Diff interpretation No C. difficile detected.  Final    Comment: Performed at [facility], [address] [place],  [address]    Coagulation Studies: No results for input(s): LABPROT, INR in the last 72 hours.  Urinalysis: No results for input(s): COLORURINE, LABSPEC, PHURINE, GLUCOSEU, HGBUR, BILIRUBINUR, KETONESUR, PROTEINUR, UROBILINOGEN, NITRITE, LEUKOCYTESUR in the last 72 hours.  Invalid input(s): APPERANCEUR    Imaging: No results found.   Medications:   . sodium chloride Stopped (02/20/18 1900)   . amoxicillin-clavulanate  1 tablet Oral QHS  . aspirin EC  81 mg Oral Daily  . docusate sodium  100 mg Oral BID  . feeding supplement (NEPRO CARB STEADY)  237 mL Oral Q24H  . insulin aspart  0-15 Units Subcutaneous TID WC  . insulin aspart  0-5 Units Subcutaneous QHS  . loratadine  10 mg Oral Daily  . mouth rinse  15 mL Mouth Rinse q12n4p  . metoprolol tartrate  25 mg Oral BID  . pantoprazole  40 mg Oral Daily  . senna  1 tablet Oral QHS  . sodium chloride flush  10-40 mL Intracatheter Q12H  . tuberculin  5 Units Intradermal Once   sodium chloride, acetaminophen **OR** acetaminophen, ipratropium-albuterol, labetalol, lactulose, ondansetron **OR** ondansetron (ZOFRAN) IV, sodium chloride flush  Assessment/ Plan:  62 y.o.Hispanic female with diabetes, hypertension, coronary atherosclerosis, h/o MI, h/o coronary angiography 2017, followed by Dr Nehemiah Massed was admitted on 02/14/2018 with Acute pyelonephritis.   1.  Acute renal failure, ATN from severe sepsis.  No recent creatinine.  It was 0.80 from July 2015 2.  Hyperkalemia, now hypokalemia 3.  Acute pyelonephritis, sepsis (E. coli, urinary source) 4.  Metabolic Acidosis 5.  Acute pulmonary edema and volume overload 6.  DM-2 with CKD. HbA1c 9.4 % on 02/14/2018 7.  CKD, unspec. H/o proteinuria since 2015   ? If patient had delayed dialyzer reaction. Improved with Solumedrol and iv benadryl Patient remains oliguric.   Plan: Overall renal function remains low.  EGFR currently 11.  We  will consult with vascular surgery for PermCath placement.  We will work towards outpatient hemodialysis center placement.  Serum potassium now normalized at 4.5.  Metabolic acidosis also improved with serum bicarbonate of 27.  Continue ultrafiltration with dialysis to treat pulmonary edema as well as lower extremity edema.   LOS: 8 Lanay Zinda 12/4/201910:51 AM  [place], [place]  Note: This note was prepared with Dragon dictation. Any transcription errors are unintentional

## 2018-02-22 NOTE — Progress Notes (Signed)
Nutrition Follow Up Note   DOCUMENTATION CODES:   Not applicable  INTERVENTION:   Nepro Shake po BID, each supplement provides 425 kcal and 19 grams protein  Rena-vite daily  MVI daily   Liberalize diet   NUTRITION DIAGNOSIS:   Inadequate oral intake related to acute illness as evidenced by meal completion < 25%.  GOAL:   Patient will meet greater than or equal to 90% of their needs  -not met  MONITOR:   PO intake, Supplement acceptance, Labs, Weight trends, Skin, I & O's  ASSESSMENT:  62 year old female with known history of HTN, CAD, T2DM, MI, cardiac cath, admitted with severe acute kidney injury and pyelonephritis.    Pt continues to have poor appetite and oral intake; eating mainly sips and bites of meals. Pt is drinking some Nepro; RD will increase to 2 per day. RD will also liberalize pt's diet to try and increase oral intake. Will also add rena-vite and MVI to help pt meet her estimated needs. Pt s/p HD initiation on 11/29; last HD today. RD will continue to monitor intakes; suspect pt at high refeeding risk.   Medications reviewed and include: augmentin, aspirin, colace, insulin, protonix, senokot  Labs reviewed: Cl 97(L), BUN 72(H), creat 4.04(H) Wbc- 21.6(H), Hgb 8.0(L), Hct 25.3(L) cbgs- 121, 153, 195, 211, 277 x 24 hrs AIC 9.4(H)- 11/26  Diet Order:   Diet Order            Diet Carb Modified Fluid consistency: Thin; Room service appropriate? Yes; Fluid restriction: 1500 mL Fluid  Diet effective now             EDUCATION NEEDS:   Not appropriate for education at this time  Skin:  Skin Assessment: Reviewed RN Assessment  Last BM:  12/4- TYPE 6  Height:   Ht Readings from Last 1 Encounters:  02/21/18 '4\' 11"'  (1.499 m)    Weight:   Wt Readings from Last 1 Encounters:  02/21/18 57.8 kg    Ideal Body Weight:  43.6 kg  BMI:  Body mass index is 25.74 kg/m.  Estimated Nutritional Needs:   Kcal:  1400-1600kca/day   Protein:  63-75g/day    Fluid:  1.3L/day   Koleen Distance MS, RD, LDN Pager #- [phone] Office#- [phone] After Hours Pager: [phone]

## 2018-02-22 NOTE — Progress Notes (Signed)
Post HD Assessment    02/22/18 1500  Neurological  Level of Consciousness Alert  Orientation Level Oriented X4  Respiratory  Respiratory Pattern Regular;Unlabored;Symmetrical  Chest Assessment Chest expansion symmetrical  Bilateral Breath Sounds Clear;Diminished  Cardiac  Pulse Regular  Heart Sounds S1, S2  Jugular Venous Distention (JVD) No  ECG Monitor Yes  Cardiac Rhythm NSR  Antiarrhythmic device No  Vascular  R Radial Pulse +2  L Radial Pulse +2  R Dorsalis Pedis Pulse +2  L Dorsalis Pedis Pulse +2  Edema Generalized  Generalized Edema +1  RUE Edema Non-pitting  LUE Edema Non-pitting  RLE Edema +2  LLE Edema +2  Perineal None  Sacral None  Integumentary  Integumentary (WDL) X  Skin Color Appropriate for ethnicity  Skin Condition Dry  Skin Integrity Catheter entry/exit site  Musculoskeletal  Musculoskeletal (WDL) X  Generalized Weakness Yes  GU Assessment  Genitourinary (WDL) X (HD pt)  Genitourinary Symptoms Oliguria  Psychosocial  Psychosocial (WDL) WDL

## 2018-02-22 NOTE — Consult Note (Signed)
[facility] Vascular Consult Note  MRN : [id number]  Cathy Cline is a 62 y.o. (08/16/1955) female who presents with chief complaint of  Chief Complaint  Patient presents with  . Abnormal Lab   History of Present Illness:  The patient is a 62 year old female with a past medical history of carotid stenosis, diabetes, hypercholesterolemia, hypertension history of MI who was sent to [facility] emergency department by her primary care physician after routine lab work showed elevated creatinine, increased white blood cell count and aggressively worsening left flank pain with nausea and vomiting.  Patient denied any fever, dysuria or hematuria.  During her emergency room work-up the patient was found to have pyelonephritis and severe acute kidney failure (only outpatient creatinine available is from 2015 which was 0.80 Admission creatinine was 5.30).  The patient was admitted and IV antibiotics were started.  PICC placed: 02/15/18 Central Line placed: 02/15/18 Central Line exchanged for Trialysis Catheter:  Dialysis initiated on 02/17/18  Since her admission and the initiation of IV antibiotics on dialysis the patient states her symptoms have slowly improved. Making some urine.  Currently, the patient has a temporary dialysis catheter which is functioning appropriately however she will need an access in which she can leave the hospital with.  Vascular surgery was consulted by Dr. Holley Raring for placement of a PermCath.  The patient's daughter was present and assisted with interpretation.  Current Facility-Administered Medications  Medication Dose Route Frequency Provider Last Rate Last Dose  . 0.9 %  sodium chloride infusion   Intravenous PRN Cassandria Santee, MD   Stopped at 02/20/18 1900  . acetaminophen (TYLENOL) tablet 650 mg  650 mg Oral Q8H PRN Cassandria Santee, MD   650 mg at 02/22/18 1202   Or  . acetaminophen (TYLENOL) suppository  650 mg  650 mg Rectal Q6H PRN Samaan, Maged, MD      . amoxicillin-clavulanate (AUGMENTIN) 500-125 MG per tablet 500 mg  1 tablet Oral QHS Saundra Shelling, MD   500 mg at 02/21/18 2112  . aspirin EC tablet 81 mg  81 mg Oral Daily Harrie Foreman, MD   81 mg at 02/22/18 1043  . docusate sodium (COLACE) capsule 100 mg  100 mg Oral BID Harrie Foreman, MD   100 mg at 02/22/18 1043  . feeding supplement (NEPRO CARB STEADY) liquid 237 mL  237 mL Oral BID BM Pyreddy, Pavan, MD      . insulin aspart (novoLOG) injection 0-15 Units  0-15 Units Subcutaneous TID WC Conforti, John, DO   8 Units at 02/22/18 0815  . insulin aspart (novoLOG) injection 0-5 Units  0-5 Units Subcutaneous QHS Conforti, John, DO   2 Units at 02/21/18 2133  . ipratropium-albuterol (DUONEB) 0.5-2.5 (3) MG/3ML nebulizer solution 3 mL  3 mL Nebulization Q4H PRN Darel Hong D, NP   3 mL at 02/16/18 1054  . labetalol (NORMODYNE,TRANDATE) injection 10 mg  10 mg Intravenous Q2H PRN Deterding, Guadelupe Sabin, MD   10 mg at 02/17/18 2051  . lactulose (CHRONULAC) 10 GM/15ML solution 10 g  10 g Oral BID PRN Frederik Pear, MD      . loratadine (CLARITIN) tablet 10 mg  10 mg Oral Daily Harrie Foreman, MD   10 mg at 02/22/18 1044  . MEDLINE mouth rinse  15 mL Mouth Rinse q12n4p Samaan, Maged, MD   15 mL at 02/20/18 1222  . metoprolol tartrate (LOPRESSOR) tablet 25 mg  25 mg Oral  BID Cassandria Santee, MD   25 mg at 02/22/18 1044  . multivitamin (RENA-VIT) tablet 1 tablet  1 tablet Oral QHS Pyreddy, Pavan, MD      . multivitamin with minerals tablet 1 tablet  1 tablet Oral Daily Pyreddy, Pavan, MD      . ondansetron (ZOFRAN) tablet 4 mg  4 mg Oral Q6H PRN Harrie Foreman, MD       Or  . ondansetron [facility]) injection 4 mg  4 mg Intravenous Q6H PRN Harrie Foreman, MD      . pantoprazole (PROTONIX) EC tablet 40 mg  40 mg Oral Daily Cassandria Santee, MD   40 mg at 02/22/18 1043  . senna (SENOKOT) tablet 8.6 mg  1 tablet Oral QHS Cassandria Santee, MD   8.6 mg at 02/21/18 2112  . sodium chloride flush (NS) 0.9 % injection 10-40 mL  10-40 mL Intracatheter Q12H Darel Hong D, NP   10 mL at 02/22/18 1046  . sodium chloride flush (NS) 0.9 % injection 10-40 mL  10-40 mL Intracatheter PRN Bradly Bienenstock, NP      . tuberculin injection 5 Units  5 Units Intradermal Once Anthonette Legato, MD   5 Units at 02/21/18 1624   Past Medical History:  Diagnosis Date  . Carotid stenosis   . Diabetes mellitus without complication ([place])   . Hypercholesterolemia   . Hypertension   . Myocardial infarction ([place])   . Tachycardia    Past Surgical History:  Procedure Laterality Date  . CARDIAC CATHETERIZATION    . COLONOSCOPY WITH PROPOFOL N/A 10/02/2015   Procedure: COLONOSCOPY WITH PROPOFOL;  Surgeon: Manya Silvas, MD;  Location: [facility] ENDOSCOPY;  Service: Endoscopy;  Laterality: N/A;  . KNEE SURGERY    . TUBAL LIGATION     Social History Social History   Tobacco Use  . Smoking status: Never Smoker  . Smokeless tobacco: Never Used  Substance Use Topics  . Alcohol use: No  . Drug use: No   Family History No family history on file.  Denies family history of peripheral artery disease, venous disease or renal disease.  No Known Allergies  REVIEW OF SYSTEMS (Negative unless checked)  Constitutional: [] Weight loss  [] Fever  [] Chills Cardiac: [] Chest pain   [] Chest pressure   [] Palpitations   [] Shortness of breath when laying flat   [] Shortness of breath at rest   [] Shortness of breath with exertion. Vascular:  [] Pain in legs with walking   [] Pain in legs at rest   [] Pain in legs when laying flat   [] Claudication   [] Pain in feet when walking  [] Pain in feet at rest  [] Pain in feet when laying flat   [] History of DVT   [] Phlebitis   [x] Swelling in legs   [] Varicose veins   [] Non-healing ulcers Pulmonary:   [] Uses home oxygen   [] Productive cough   [] Hemoptysis   [] Wheeze  [] COPD   [] Asthma Neurologic:  [] Dizziness  [] Blackouts    [] Seizures   [] History of stroke   [] History of TIA  [] Aphasia   [] Temporary blindness   [] Dysphagia   [] Weakness or numbness in arms   [] Weakness or numbness in legs Musculoskeletal:  [] Arthritis   [] Joint swelling   [] Joint pain   [] Low back pain Hematologic:  [] Easy bruising  [] Easy bleeding   [] Hypercoagulable state   [] Anemic  [] Hepatitis Gastrointestinal:  [] Blood in stool   [] Vomiting blood  [] Gastroesophageal reflux/heartburn   [] Difficulty swallowing. Genitourinary:  [x] Chronic kidney disease   [  x]Difficult urination  [] Frequent urination  [] Burning with urination   [] Blood in urine Skin:  [] Rashes   [] Ulcers   [] Wounds Psychological:  [] History of anxiety   []  History of major depression.  Physical Examination  Vitals:   02/22/18 1130 02/22/18 1145 02/22/18 1200 02/22/18 1215  BP: (!) 161/63 (!) 166/68 (!) 167/67 (!) 152/62  Pulse: 77 81 77 72  Resp: 19 19 17 14   Temp:      TempSrc:      SpO2: 98% 98% 100% 100%  Weight:      Height:       Body mass index is 25.74 kg/m. Gen:  WD/WN, NAD Head: [place]/AT, No temporalis wasting. Prominent temp pulse not noted. Ear/Nose/Throat: Hearing grossly intact, nares w/o erythema or drainage, oropharynx w/o Erythema/Exudate Eyes: Sclera non-icteric, conjunctiva clear Neck: Trachea midline.  No JVD.  Pulmonary:  Good air movement, respirations not labored, equal bilaterally.  Cardiac: RRR, normal S1, S2. Vascular:  Vessel Right Left  Radial Palpable Palpable  Ulnar Palpable Palpable  Brachial Palpable Palpable  Carotid Palpable, without bruit Palpable, without bruit  Aorta Not palpable N/A  Femoral Palpable Palpable  Popliteal Palpable Palpable  PT Palpable Palpable  DP Palpable Palpable   Gastrointestinal: soft, non-tender/non-distended. No guarding/reflex.  Musculoskeletal: M/S 5/5 throughout.  Extremities without ischemic changes.  No deformity or atrophy. Mild to moderate edema. Neurologic: Sensation grossly intact in  extremities.  Symmetrical.  Speech is fluent. Motor exam as listed above. Psychiatric: Judgment intact, Mood & affect appropriate for pt's clinical situation. Dermatologic: No rashes or ulcers noted.  No cellulitis or open wounds. Lymph : No Cervical, Axillary, or Inguinal lymphadenopathy.  CBC Lab Results  Component Value Date   WBC 21.6 (H) 02/22/2018   HGB 8.0 (L) 02/22/2018   HCT 25.3 (L) 02/22/2018   MCV 83.5 02/22/2018   PLT 77 (L) 02/22/2018   BMET    Component Value Date/Time   NA 135 02/22/2018 0500   NA 140 10/05/2013 0329   K 4.5 02/22/2018 0500   K 4.7 10/05/2013 0329   CL 97 (L) 02/22/2018 0500   CL 110 (H) 10/05/2013 0329   CO2 27 02/22/2018 0500   CO2 24 10/05/2013 0329   GLUCOSE 289 (H) 02/22/2018 0500   GLUCOSE 244 (H) 10/05/2013 0329   BUN 72 (H) 02/22/2018 0500   BUN 11 10/05/2013 0329   CREATININE 4.04 (H) 02/22/2018 0500   CREATININE 0.80 10/05/2013 0329   CALCIUM 7.8 (L) 02/22/2018 0500   CALCIUM 7.9 (L) 10/05/2013 0329   GFRNONAA 11 (L) 02/22/2018 0500   GFRNONAA >60 10/05/2013 0329   GFRAA 13 (L) 02/22/2018 0500   GFRAA >60 10/05/2013 0329   Estimated Creatinine Clearance: 11.3 mL/min (A) (by C-G formula based on SCr of 4.04 mg/dL (H)).  COAG Lab Results  Component Value Date   INR 1.36 02/15/2018   INR 1.0 10/05/2013   Radiology Ct Abdomen Pelvis Wo Contrast  Result Date: 02/16/2018 CLINICAL DATA:  Unexplained anemia. Recent sepsis. A CT scan from February 14, 2018 demonstrated an abnormal left kidney thought to represent infection of the renal collecting system with a recently passed stone considered less likely. EXAM: CT ABDOMEN AND PELVIS WITHOUT CONTRAST TECHNIQUE: Multidetector CT imaging of the abdomen and pelvis was performed following the standard protocol without IV contrast. COMPARISON:  February 14, 2018 CT scan FINDINGS: Lower chest: Bilateral pleural effusions are identified, larger on the left and new on the right. Opacities  underlying the effusions are  likely atelectasis with infiltrate less likely. Coronary artery calcifications are noted. The lower chest is otherwise unremarkable. Hepatobiliary: The liver is unremarkable. The gallbladder is distended and contains a single stone. The gallbladder was better assessed with an ultrasound from this morning. Pancreas: Unremarkable. No pancreatic ductal dilatation or surrounding inflammatory changes. Spleen: Normal in size without focal abnormality. Adrenals/Urinary Tract: Adrenal glands are normal. No renal stones identified. No hydronephrosis on the right. There is persistent hydronephrosis on the left. There is bilateral perinephric stranding. There is mild left ureterectasis. The right ureter is normal. No ureteral stones. The bladder is decompressed with a Foley catheter but it remains mildly thick walled. There is air in the bladder from the Foley catheter. Stomach/Bowel: The colon is unremarkable.  The appendix is normal. Vascular/Lymphatic: Atherosclerotic changes are seen in the nonaneurysmal aorta. No adenopathy. Reproductive: Uterus and bilateral adnexa are unremarkable. Other: There is increased attenuation diffusely throughout the subcutaneous fat. There is also increased attenuation in the intra-abdominal fat as well as mild ascites extending down the pericolic gutters into the pelvis, new. No free air. Musculoskeletal: No acute or significant osseous findings. IMPRESSION: 1. Left-sided hydronephrosis persists. On the previous study, there was increased perinephric stranding on the left as well compared to the right. I suspect the hydronephrosis on the left is likely acute to subacute although is strictly speaking age indeterminate. I doubt the findings are from a recently passed stone as there has been no significant change since February 14, 2018. An underlying obstruction which is not radiopaque such as a blood clot or urothelial lesion are possible. Infection of the left  renal collecting system is also possible. Recommend clinical correlation and correlation with a urinalysis. 2. The gallbladder is distended with a single stone. If there is concern for acute cholecystitis, recommend HIDA scan. 3. Bilateral pleural effusions, increased attenuation in the subcutaneous fat, and increased attenuation in the intra-abdominal fat all suggest third spacing of fluid/edema. 4. The increased perinephric stranding on the right may be due to the overall increased third-spacing of fluid/edema. No hydronephrosis or stones seen on the right. 5. Opacity under the pleural effusions is likely atelectasis. 6. Coronary artery calcifications and abdominal aorta atherosclerotic change. Electronically Signed   By: Dorise Bullion III M.D   On: 02/16/2018 09:29   Dg Chest 1 View  Result Date: 02/15/2018 CLINICAL DATA:  62 y/o  F; encounter for central line placement. EXAM: CHEST  1 VIEW COMPARISON:  02/15/2018 chest radiograph. FINDINGS: Stable cardiomegaly given projection and technique. Aortic calcific atherosclerosis. Left central venous catheter tip projects over the upper SVC. No pneumothorax. No pleural effusion, consolidation, or pneumothorax. Pulmonary venous hypertension. No acute osseous abnormality is evident. IMPRESSION: 1. Left central venous catheter tip projects over the upper SVC. 2. Cardiomegaly and pulmonary venous hypertension. Electronically Signed   By: Kristine Garbe M.D.   On: 02/15/2018 22:41   Dg Abd 1 View  Result Date: 02/19/2018 CLINICAL DATA:  Abdominal distension EXAM: ABDOMEN - 1 VIEW COMPARISON:  CT 02/16/2018, chest x-ray 02/19/2018 FINDINGS: Vascular catheter tip over the right atrium. Small pleural effusions and basilar consolidations. Bowel gas pattern is nonobstructed. Round calcification in the right upper quadrant corresponds to a gallstone. IMPRESSION: Nonobstructed bowel-gas pattern.  Gallstone. Electronically Signed   By: Donavan Foil M.D.   On:  02/19/2018 18:27   Nm Hepatobiliary Liver Func  Result Date: 02/17/2018 CLINICAL DATA:  Sepsis. Gallstones and wall thickening on prior ultrasound. EXAM: NUCLEAR MEDICINE HEPATOBILIARY IMAGING TECHNIQUE: Sequential  images of the abdomen were obtained out to 60 minutes following intravenous administration of radiopharmaceutical. RADIOPHARMACEUTICALS:  5.85 mCi Tc-31m  Choletec IV COMPARISON:  CT and ultrasound 02/16/2018 FINDINGS: Prompt uptake and excretion of activity by the liver. Of radiotracer passes into the small bowel compatible with patent common bile duct. No activity seen in gallbladder at 60 minutes. Therefore, 2 milligrams of morphine was administered IV and imaging was continued. Despite the morphine, gallbladder was not visualized. IMPRESSION: Nonvisualization of the gallbladder despite morphine administration. Findings concerning for cystic duct obstruction. Electronically Signed   By: Rolm Baptise M.D.   On: 02/17/2018 14:18   US Renal  Result Date: 02/15/2018 CLINICAL DATA:  Renal failure. EXAM: RENAL / URINARY TRACT ULTRASOUND COMPLETE COMPARISON:  CT abdomen and pelvis 02/14/2018 FINDINGS: Right Kidney: Renal measurements: 8.7 x 4.3 x 5.0 cm = volume: 98 mL . Echogenicity within normal limits. No mass or hydronephrosis visualized. Left Kidney: Renal measurements: 13.2 x 6.6 x 6.0 cm = volume: 272 mL. Mild hydronephrosis without mass or perinephric fluid collection identified. Bladder: Collapsed bladder, poorly evaluated. IMPRESSION: Persistent asymmetric enlargement of the left kidney with mild left hydronephrosis, similar to yesterday's CT. Electronically Signed   By: Logan Bores M.D.   On: 02/15/2018 14:32   Dg Chest Port 1 View  Result Date: 02/20/2018 CLINICAL DATA:  62 year old female with congestive heart failure. EXAM: PORTABLE CHEST 1 VIEW COMPARISON:  02/19/2018 and earlier. FINDINGS: Portable AP upright view at 0553 hours. Stable left IJ approach central line. Stable lung  volumes. Veiling bibasilar opacity with confluent retrocardiac opacity. Stable cardiac size and mediastinal contours. Persistent increased pulmonary vascular congestion. No pneumothorax. Negative visible bowel gas pattern. IMPRESSION: 1. Persistent pulmonary vascular congestion suggesting interstitial edema. 2. Continued bilateral pleural effusions and lower lobe collapse or consolidation. Electronically Signed   By: Genevie Ann M.D.   On: 02/20/2018 07:40   Dg Chest Port 1 View  Result Date: 02/19/2018 CLINICAL DATA:  Pneumonia. EXAM: PORTABLE CHEST 1 VIEW COMPARISON:  02/18/2018 FINDINGS: Left jugular catheter terminates at the superior cavoatrial junction. The cardiomediastinal silhouette is unchanged. Pulmonary vascular congestion and diffuse interstitial opacities are similar to the prior study. Veiling opacities in both lung bases are unchanged and consistent with small pleural effusions. Bibasilar airspace opacities likely reflect atelectasis although pneumonia is not excluded. No pneumothorax is identified. IMPRESSION: Unchanged pulmonary edema and pleural effusions. Electronically Signed   By: Logan Bores M.D.   On: 02/19/2018 08:54   Dg Chest Port 1 View  Result Date: 02/18/2018 CLINICAL DATA:  Pneumonia. EXAM: PORTABLE CHEST 1 VIEW COMPARISON:  02/17/2018 FINDINGS: Stable position of left IJ approach central venous catheter with tip over the expected location of the right atrium. Cardiomediastinal silhouette is normal. Mediastinal contours appear intact. There is no evidence of pneumothorax. Mixed pattern pulmonary edema. Possible small bilateral pleural effusions. Osseous structures are without acute abnormality. Soft tissues are grossly normal. IMPRESSION: Mixed pattern pulmonary edema. Possible bilateral small pleural effusions. Electronically Signed   By: Fidela Salisbury M.D.   On: 02/18/2018 07:32   Dg Chest Port 1 View  Result Date: 02/17/2018 CLINICAL DATA:  Initial evaluation for  new central line, right side. EXAM: PORTABLE CHEST 1 VIEW COMPARISON:  Prior radiograph from 02/17/2018 FINDINGS: Left IJ approach central venous catheter has been replaced with a new larger caliber line, tip projects over the right atrium. No visible right-sided catheter. Mild cardiomegaly, stable. Mediastinal silhouette within normal limits. Lungs hypoinflated. Perihilar vascular congestion with  diffuse interstitial prominence, compatible with mild diffuse pulmonary interstitial edema, similar to previous. Persistent small right pleural effusion. Associated bibasilar opacities favored to reflect atelectasis and/or edema. Infiltrates could be considered in the correct clinical setting. No pneumothorax. Osseous structures unchanged. IMPRESSION: 1. Interval placement of left IJ approach central venous catheter with tip projecting over the right atrium. No visible right-sided catheter. No evidence of pneumothorax. 2. Perihilar vascular congestion with diffuse interstitial prominence, compatible with mild diffuse pulmonary interstitial edema, similar to previous. 3. Small right pleural effusion. Electronically Signed   By: Jeannine Boga M.D.   On: 02/17/2018 06:54   Dg Chest Port 1 View  Result Date: 02/17/2018 CLINICAL DATA:  Pneumonia. EXAM: PORTABLE CHEST 1 VIEW COMPARISON:  Radiograph yesterday. FINDINGS: Left internal jugular central venous catheter tip in the proximal SVC. Low lung volumes with bibasilar opacities and pleural effusions, progressive. Mild cardiomegaly unchanged. Vascular congestion with central pulmonary edema, similar to prior. No pneumothorax. IMPRESSION: Progressive pleural effusions and bibasilar opacities, likely atelectasis. Unchanged pulmonary edema. Electronically Signed   By: Keith Rake M.D.   On: 02/17/2018 03:46   Dg Chest Port 1 View  Result Date: 02/16/2018 CLINICAL DATA:  Follow-up pneumonia EXAM: PORTABLE CHEST 1 VIEW COMPARISON:  02/15/2018 FINDINGS: Cardiac  shadow remains enlarged. Left jugular central line is again seen in the mid superior vena cava. No pneumothorax is noted. Increasing pulmonary vascular congestion with interstitial edema is seen. No focal confluent infiltrate or effusion is noted. IMPRESSION: Increasing changes of CHF. Electronically Signed   By: Inez Catalina M.D.   On: 02/16/2018 07:44   Dg Chest Port 1 View  Result Date: 02/15/2018 CLINICAL DATA:  Acute respiratory failure EXAM: PORTABLE CHEST 1 VIEW COMPARISON:  10/01/2013, CT chest 09/22/2015 FINDINGS: Cardiomegaly. Bibasilar atelectasis. No pleural effusion or pneumothorax. IMPRESSION: Cardiomegaly with bibasilar atelectasis Electronically Signed   By: Donavan Foil M.D.   On: 02/15/2018 17:46   Ct Renal Stone Study  Result Date: 02/14/2018 CLINICAL DATA:  62 y/o F; leukocytosis and elevated creatinine. Left flank pain. Nausea and vomiting. EXAM: CT ABDOMEN AND PELVIS WITHOUT CONTRAST TECHNIQUE: Multidetector CT imaging of the abdomen and pelvis was performed following the standard protocol without IV contrast. COMPARISON:  10/02/2013 abdominal ultrasound. FINDINGS: Lower chest: Right lung base atelectasis. Trace left effusion. Mild coronary artery calcific atherosclerosis. Hepatobiliary: No focal liver abnormality is seen. Cholelithiasis. No gallbladder wall thickening. No biliary ductal dilatation. Pancreas: Unremarkable. No pancreatic ductal dilatation or surrounding inflammatory changes. Spleen: Normal in size without focal abnormality. Adrenals/Urinary Tract: Normal adrenal glands. Normal right kidney. No urinary stone disease identified. The left kidney is enlarged, there is extensive left-sided perinephric stranding, and there is mild left hydronephrosis. No obstructing stone or mass identified. Bladder wall thickening. Stomach/Bowel: Stomach is within normal limits. Appendix appears normal. No evidence of bowel wall thickening, distention, or inflammatory changes.  Vascular/Lymphatic: Aortic atherosclerosis. No enlarged abdominal or pelvic lymph nodes. Reproductive: Uterus and bilateral adnexa are unremarkable. Other: No abdominal wall hernia or abnormality. No abdominopelvic ascites. Musculoskeletal: L5-S1 grade 1 anterolisthesis and chronic L5 pars defects. No acute fracture identified. IMPRESSION: 1. Enlarged left kidney, left perinephric stranding, and mild left hydronephrosis. Bladder wall thickening. No obstructing stone or mass identified. Findings probably represent infection of the renal collecting system, less likely recently passed stone. 2. Cholelithiasis. 3. Mild coronary artery and aortic calcific atherosclerosis. Electronically Signed   By: Kristine Garbe M.D.   On: 02/14/2018 20:11   [place] Ekg Site Rite  Result  Date: 02/15/2018 If Site Rite image not attached, placement could not be confirmed due to current cardiac rhythm.  US Abdomen Limited Ruq  Result Date: 02/16/2018 CLINICAL DATA:  Elevated LFTs. EXAM: ULTRASOUND ABDOMEN LIMITED RIGHT UPPER QUADRANT COMPARISON:  CT 02/14/2018 FINDINGS: Gallbladder: Distended containing intraluminal sludge and shadowing 1.2 cm gallstone. Mild gallbladder wall thickening of 3.6 mm. No sonographic Murphy sign noted by sonographer. Common bile duct: Diameter: 4 mm, normal. Liver: No focal lesion identified. Within normal limits in parenchymal echogenicity. Portal vein is patent on color Doppler imaging with normal direction of blood flow towards the liver. Right pleural effusion incidentally noted. Trace perihepatic fluid inferiorly. IMPRESSION: 1. Gallbladder distention containing intraluminal sludge and stone. Borderline mild gallbladder wall thickening. Findings could represent acute cholecystitis in the appropriate clinical setting. Consider nuclear medicine hepatic biliary scan based on clinical concern. 2. No biliary dilatation. No focal hepatic abnormality to explain elevated LFTs. Electronically Signed    By: Keith Rake M.D.   On: 02/16/2018 01:52   Assessment/Plan The patient is a 62 year old female with a past medical history of carotid stenosis, diabetes, hypercholesterolemia, hypertension history of MI admitted with acute kidney failure and pyelonephritis 1.  Acute kidney failure: The patient has been dialyzing through a temporary dialysis catheter.  The patient will need to continue to dialyze as an outpatient will need a more permanent access.  We will plan on placing a PermCath tomorrow (Thursday 02/23/18) with Dr. Lucky Cowboy.  Procedure, risks and benefits explained to the patient and her daughter.  All questions answered.  Patient wishes to proceed.  If the patient will need a more permanent access recommend follow-up as an outpatient in our office for upper extremity vein mapping. 2. Hypercholesterolemia: On ASA. Encouraged good control as its slows the progression of atherosclerotic disease 3. Diabetes: Encouraged good control as its slows the progression of atherosclerotic disease 4. Hypertension: Encouraged good control as its slows the progression of atherosclerotic and renal disease  Discussed with Dr. Mayme Genta, PA-C  02/22/2018 12:33 PM  This note was created with Dragon medical transcription system.  Any error is purely unintentional.

## 2018-02-22 NOTE — Progress Notes (Signed)
Pre HD and HD Initiation    02/22/18 1115  Vital Signs  Temp 98.5 F (36.9 C)  Temp Source Oral  Pulse Rate 83  Pulse Rate Source Monitor  Resp (!) 21  BP (!) 153/59  BP Location Right Arm  BP Method Automatic  Patient Position (if appropriate) Sitting  Oxygen Therapy  SpO2 100 %  O2 Device Nasal Cannula  O2 Flow Rate (L/min) 2 L/min  Pain Assessment  Pain Scale 0-10  Pain Score 0  Dialysis Weight  Weight  (Unable to weigh)  Type of Weight Pre-Dialysis  Time-Out for Hemodialysis  What Procedure? HD  Pt Identifiers(min of two) First/Last Name;MRN/Account#;Pt's DOB(use if MRN/Acct# not available  Correct Site? Yes  Correct Side? Yes  Correct Procedure? Yes  Consents Verified? Yes  Rad Studies Available? N/A  Safety Precautions Reviewed? Yes  Engineer, civil (consulting) Number 4  Station Number 1  UF/Alarm Test Passed  Conductivity: Meter 14  Conductivity: Machine  14  pH 7.4  Reverse Osmosis Main  Normal Saline Lot Number [id number]  Dialyzer Lot Number [id number]  Disposable Set Lot Number [id number]  Machine Temperature 98.6 F (37 C)  Musician and Audible Yes  Blood Lines Intact and Secured Yes  Pre Treatment Patient Checks  Vascular access used during treatment Catheter  Patient is receiving dialysis in a chair Yes  Hepatitis B Surface Antigen Results Negative  Date Hepatitis B Surface Antigen Drawn 02/21/18  Isolation Initiated Yes  Hepatitis B Surface Antibody  (>10)  Date Hepatitis B Surface Antibody Drawn 02/21/18  Hemodialysis Consent Verified Yes  Hemodialysis Standing Orders Initiated Yes  ECG (Telemetry) Monitor On Yes  Prime Ordered Normal Saline  Length of  DialysisTreatment -hour(s) 3.5 Hour(s)  Dialysis Treatment Comments Na 140  Dialyzer Elisio 17H NR  Dialysate 2K, 2.5 Ca  Variable Sodium Other (Comment)  Dialysis Anticoagulant None  Dialysate Flow Ordered 800  Blood Flow Rate Ordered 400 mL/min  Ultrafiltration Goal 1 Liters  Pre  Treatment Labs Phosphorus;Other (Comment)  Dialysis Blood Pressure Support Ordered Normal Saline  During Hemodialysis Assessment  Blood Flow Rate (mL/min) 400 mL/min  Arterial Pressure (mmHg) -150 mmHg  Venous Pressure (mmHg) 100 mmHg  Transmembrane Pressure (mmHg) 50 mmHg  Ultrafiltration Rate (mL/min) 430 mL/min  Dialysate Flow Rate (mL/min) 800 ml/min  Conductivity: Machine  14  HD Safety Checks Performed Yes  Intra-Hemodialysis Comments Tx initiated;Progressing as prescribed  Education / Care Plan  Dialysis Education Provided Yes  Documented Education in Care Plan Yes  Hemodialysis Catheter Left Internal jugular Triple-lumen  Placement Date/Time: 02/17/18 0630   Placed prior to admission: No  Time Out: Correct patient  Person Inserting Catheter: Dr. Soyla Murphy  Orientation: Left  Access Location: Internal jugular  Hemodialysis Catheter Type: Triple-lumen  Site Condition No complications  Blue Lumen Status Infusing;Capped (Central line)  Red Lumen Status Infusing;Capped (Central line)  Purple Lumen Status Other (Comment)

## 2018-02-22 NOTE — Progress Notes (Signed)
[facility] at [place] NAME: Cathy Cline    MR#:  [id number]  DATE OF BIRTH:  [birth year]  SUBJECTIVE:  CHIEF COMPLAINT:   Chief Complaint  Patient presents with  . Abnormal Lab  Patient seen and evaluated today Conversation done with the help of family members Family is at bedside No abdominal pain Comfortable on oxygen by nasal cannula Planning for permanent vascular access by vascular surgery for dialysis as outpatient  REVIEW OF SYSTEMS:    ROS  CONSTITUTIONAL: No documented fever. Has fatigue, weakness. No weight gain, no weight loss.  EYES: No blurry or double vision.  ENT: No tinnitus. No postnasal drip. No redness of the oropharynx.  RESPIRATORY: No cough, no wheeze, no hemoptysis. Has dyspnea.  CARDIOVASCULAR: No chest pain. No orthopnea. No palpitations. No syncope.  GASTROINTESTINAL: Decreased nausea, decreased vomiting , no diarrhea.  Decreased abdominal pain. No melena or hematochezia.  GENITOURINARY: No dysuria or hematuria.  ENDOCRINE: No polyuria or nocturia. No heat or cold intolerance.  HEMATOLOGY: No anemia. No bruising. No bleeding.  INTEGUMENTARY: No rashes. No lesions.  MUSCULOSKELETAL: No arthritis. No swelling. No gout.  NEUROLOGIC: No numbness, tingling, or ataxia. No seizure-type activity.  PSYCHIATRIC: No anxiety. No insomnia. No ADD.   DRUG ALLERGIES:  No Known Allergies  VITALS:  Blood pressure (!) 170/57, pulse 79, temperature 97.9 F (36.6 C), temperature source Oral, resp. rate 16, height 4\' 11"  (1.499 m), weight 57.8 kg, SpO2 100 %.  PHYSICAL EXAMINATION:   Physical Exam  GENERAL:  62 y.o.-year-old patient lying in the bed with no acute distress.  EYES: Pupils equal, round, reactive to light and accommodation. No scleral icterus. Extraocular muscles intact.  HEENT: Head atraumatic, normocephalic. Oropharynx dry and nasopharynx clear.  NECK:  Supple, no jugular venous distention. No  thyroid enlargement, no tenderness.  LUNGS: Improved breath sounds bilaterally, bilateral rhonchi heard. No use of accessory muscles of respiration.  Off BiPAP CARDIOVASCULAR: S1, S2 tachycardia noted. No murmurs, rubs, or gallops.  ABDOMEN: Soft , periumbilical tenderness decreased, nondistended. Bowel sounds present. No organomegaly or mass.  EXTREMITIES: No cyanosis, clubbing or edema b/l.    NEUROLOGIC: Cranial nerves II through XII are intact. No focal Motor or sensory deficits b/l.   PSYCHIATRIC: The patient is alert and oriented x 3.  SKIN: No obvious rash, lesion, or ulcer.   LABORATORY PANEL:   CBC Recent Labs  Lab 02/22/18 0500  WBC 21.6*  HGB 8.0*  HCT 25.3*  PLT 77*   ------------------------------------------------------------------------------------------------------------------ Chemistries  Recent Labs  Lab 02/19/18 0417  02/20/18 0416  02/22/18 0500  NA 138   < > 139   < > 135  K 3.3*   < > 3.5   < > 4.5  CL 97*   < > 97*   < > 97*  CO2 30   < > 28   < > 27  GLUCOSE 188*   < > 129*   < > 289*  BUN 60*   < > 81*   < > 72*  CREATININE 3.47*   < > 4.03*   < > 4.04*  CALCIUM 8.2*   < > 8.1*   < > 7.8*  MG 2.1  --   --   --   --   AST 149*  --  78*  --   --   ALT 118*  --  78*  --   --   ALKPHOS 212*  --  159*  --   --   BILITOT 0.5  --  0.7  --   --    < > = values in this interval not displayed.   ------------------------------------------------------------------------------------------------------------------  Cardiac Enzymes No results for input(s): TROPONINI in the last 168 hours. ------------------------------------------------------------------------------------------------------------------  RADIOLOGY:  No results found.   ASSESSMENT AND PLAN:   62 year old female patient with with history of hypertension, hyperlipidemia, type 2 diabetes mellitus currently in ICU for sepsis and renal failure  -Acute kidney injury and renal failure secondary  to ATN from sepsis Needs dialysis long-term and as outpatient Vascular surgery to put a Vas-Cath for access tomorrow Appreciate vascular surgery follow-up Nephrology follow-up appreciated  -Status post septic shock  -Sepsis secondary to pyelonephritis from E. coli improved Patient switched to oral antibiotics based on culture and sensitivity  -Hyperglycemia improved Weaned off insulin drip Probably secondary to steroids given before dialysis sessions secondary to allergic reaction  -Metabolic acidosis improved  -Acute pulmonary edema and volume overload improved  -E. coli urinary tract infection Oral antibiotic to continue  -Status post respiratory failure Off BiPAP Continue oxygen via nasal cannula  -Acute hyperkalemia resolved  -Cholecystitis Follow-up LFTs No surgical intervention as of now as per surgical consultant HIDA scan suggestive of nonvisualization of gallbladder consistent with cholecystitis In view of sepsis, renal failure and respiratory failure no surgery recommended at this point Continue antibiotic therapy Percutaneous cholecystostomy tube also not recommended at this time  -Leukocytosis secondary to steroids No evidence of any fever, infection  -Anemia appears chronic Secondary to renovascular disease Monitor hemoglobin hematocrit  All the records are reviewed and case discussed with Care Management/Social Worker. Management plans discussed with the patient, family and they are in agreement.  CODE STATUS: Full code  DVT Prophylaxis: SCDs  TOTAL TIME TAKING CARE OF THIS PATIENT: 32 minutes.   POSSIBLE D/C IN  1 to 2 DAYS, DEPENDING ON CLINICAL CONDITION.  Saundra Shelling M.D on 02/22/2018 at 3:43 PM  Between 7am to 6pm - Pager - [phone]  After 6pm go to [email or web address] - password [email or web address] [facility]  Office  [phone]  CC: Primary care physician; Petra Kuba, MD  Note: This dictation was prepared with  Dragon dictation along with smaller phrase technology. Any transcriptional errors that result from this process are unintentional.

## 2018-02-22 NOTE — Progress Notes (Signed)
Post HD Treatment  Pt tolerated treatment well in chair. All blood was returned to patient upon ending of treatment. Her net UF was 1023 and her BVP was 78.6. Report called to primary nurse. No complications noted.     02/22/18 1457  Hand-Off documentation  Report given to (Full Name) Lorrin Jackson, RN  Report received from (Full Name) Stephannie Peters, RN  Vital Signs  Temp 98 F (36.7 C)  Temp Source Oral  Pulse Rate 77  Pulse Rate Source Monitor  Resp 14  BP (!) 160/55  BP Location Right Arm  BP Method Automatic  Patient Position (if appropriate) Sitting  Oxygen Therapy  SpO2 100 %  O2 Device Nasal Cannula  O2 Flow Rate (L/min) 2 L/min  Pain Assessment  Pain Scale 0-10  Pain Score 0  Dialysis Weight  Type of Weight Post-Dialysis (Unable to weigh)  Post-Hemodialysis Assessment  Rinseback Volume (mL) 250 mL  KECN 285 V  Dialyzer Clearance Lightly streaked  Duration of HD Treatment -hour(s) 3.5 hour(s)  Hemodialysis Intake (mL) 500 mL  UF Total -Machine (mL) 1523 mL  Net UF (mL) 1023 mL  Tolerated HD Treatment Yes  Note  Observations Pt tolerated well.  Hemodialysis Catheter Left Internal jugular Triple-lumen  Placement Date/Time: 02/17/18 0630   Placed prior to admission: No  Time Out: Correct patient  Person Inserting Catheter: Dr. Soyla Murphy  Orientation: Left  Access Location: Internal jugular  Hemodialysis Catheter Type: Triple-lumen  Site Condition No complications  Blue Lumen Status Capped (Central line);Heparin locked  Red Lumen Status Capped (Central line);Heparin locked  Purple Lumen Status Other (Comment)  Catheter fill solution Heparin 1000 units/ml  Catheter fill volume (Arterial) 1.4 cc  Catheter fill volume (Venous) 1.4  Dressing Type Biopatch;Occlusive  Dressing Status Clean;Dry;Intact  Drainage Description None  Post treatment catheter status Capped and Clamped

## 2018-02-22 NOTE — Progress Notes (Signed)
HD Pre Assessment    02/22/18 1110  Neurological  Level of Consciousness Alert  Orientation Level Oriented X4  Respiratory  Respiratory Pattern Regular;Unlabored;Symmetrical  Chest Assessment Chest expansion symmetrical  Bilateral Breath Sounds Diminished;Fine crackles  Cardiac  Pulse Regular  Heart Sounds S1, S2  Jugular Venous Distention (JVD) No  ECG Monitor Yes  Cardiac Rhythm NSR  Antiarrhythmic device No  Vascular  R Radial Pulse +2  L Radial Pulse +2  R Dorsalis Pedis Pulse +2  L Dorsalis Pedis Pulse +2  Edema Generalized  Generalized Edema +1  RUE Edema Non-pitting  LUE Edema Non-pitting  RLE Edema +2  LLE Edema +2  Perineal None  Sacral None  Integumentary  Integumentary (WDL) X  Skin Color Appropriate for ethnicity  Skin Condition Dry  Skin Integrity Catheter entry/exit site  Musculoskeletal  Musculoskeletal (WDL) X  Generalized Weakness Yes  GU Assessment  Genitourinary (WDL) X (HD pt)  Genitourinary Symptoms Oliguria  Psychosocial  Psychosocial (WDL) WDL

## 2018-02-22 NOTE — Progress Notes (Signed)
HD Treatment Complete    02/22/18 1454  Vital Signs  Pulse Rate 83  Pulse Rate Source Monitor  Resp 15  Oxygen Therapy  SpO2 100 %  O2 Device Nasal Cannula  O2 Flow Rate (L/min) 2 L/min  During Hemodialysis Assessment  Blood Flow Rate (mL/min) 400 mL/min  Arterial Pressure (mmHg) -160 mmHg  Venous Pressure (mmHg) 120 mmHg  Transmembrane Pressure (mmHg) 50 mmHg  Ultrafiltration Rate (mL/min) 430 mL/min  Dialysate Flow Rate (mL/min) 800 ml/min  Conductivity: Machine  14  HD Safety Checks Performed Yes  Intra-Hemodialysis Comments Progressing as prescribed;Tolerated well;Tx completed  Hemodialysis Catheter Left Internal jugular Triple-lumen  Placement Date/Time: 02/17/18 0630   Placed prior to admission: No  Time Out: Correct patient  Person Inserting Catheter: Dr. Soyla Murphy  Orientation: Left  Access Location: Internal jugular  Hemodialysis Catheter Type: Triple-lumen  Blue Lumen Status Capped (Central line);Heparin locked  Red Lumen Status Capped (Central line);Heparin locked

## 2018-02-23 ENCOUNTER — Encounter
Admission: EM | Disposition: A | Discharge status: Home / Self Care | Payer: Self-pay | Source: Home / Self Care | Attending: Internal Medicine

## 2018-02-23 ENCOUNTER — Encounter: Payer: Self-pay | Admitting: *Deleted

## 2018-02-23 DIAGNOSIS — N186 End stage renal disease: Secondary | ICD-10-CM

## 2018-02-23 HISTORY — PX: DIALYSIS/PERMA CATHETER INSERTION: CATH118288

## 2018-02-23 LAB — GLUCOSE, CAPILLARY
Glucose-Capillary: 115 mg/dL — ABNORMAL HIGH (ref 70–99)
Glucose-Capillary: 108 mg/dL — ABNORMAL HIGH (ref 70–99)
Glucose-Capillary: 121 mg/dL — ABNORMAL HIGH (ref 70–99)
Glucose-Capillary: 163 mg/dL — ABNORMAL HIGH (ref 70–99)
Glucose-Capillary: 237 mg/dL — ABNORMAL HIGH (ref 70–99)

## 2018-02-23 LAB — CBC
HCT: 26.2 % — ABNORMAL LOW (ref 36.0–46.0)
Hemoglobin: 8.3 g/dL — ABNORMAL LOW (ref 12.0–15.0)
MCH: 26.6 pg (ref 26.0–34.0)
MCHC: 31.7 g/dL (ref 30.0–36.0)
MCV: 84 fL (ref 80.0–100.0)
Platelets: 104 10*3/uL — ABNORMAL LOW (ref 150–400)
RBC: 3.12 MIL/uL — ABNORMAL LOW (ref 3.87–5.11)
RDW: 18.6 % — ABNORMAL HIGH (ref 11.5–15.5)
WBC: 16.8 10*3/uL — ABNORMAL HIGH (ref 4.0–10.5)
nRBC: 0 % (ref 0.0–0.2)

## 2018-02-23 LAB — BASIC METABOLIC PANEL
Anion gap: 8 (ref 5–15)
BUN: 34 mg/dL — ABNORMAL HIGH (ref 8–23)
CO2: 30 mmol/L (ref 22–32)
Calcium: 7.6 mg/dL — ABNORMAL LOW (ref 8.9–10.3)
Chloride: 99 mmol/L (ref 98–111)
Creatinine, Ser: 2.84 mg/dL — ABNORMAL HIGH (ref 0.44–1.00)
GFR calc Af Amer: 20 mL/min — ABNORMAL LOW (ref >60)
GFR, EST NON AFRICAN AMERICAN: 17 mL/min — AB (ref >60)
Glucose, Bld: 150 mg/dL — ABNORMAL HIGH (ref 70–99)
Potassium: 3.7 mmol/L (ref 3.5–5.1)
Sodium: 137 mmol/L (ref 135–145)

## 2018-02-23 SURGERY — DIALYSIS/PERMA CATHETER INSERTION
Anesthesia: Choice

## 2018-02-23 MED ORDER — FENTANYL CITRATE (PF) 100 MCG/2ML IJ SOLN
INTRAMUSCULAR | Status: DC | PRN
  Administered 2018-02-23 (×2): 25 ug via INTRAVENOUS

## 2018-02-23 MED ORDER — SODIUM CHLORIDE 0.9 % IV BOLUS
250.0000 mL | Freq: Once | INTRAVENOUS | Status: AC
  Administered 2018-02-23: 250 mL via INTRAVENOUS

## 2018-02-23 MED ORDER — AMLODIPINE BESYLATE 5 MG PO TABS
5.0000 mg | ORAL_TABLET | Freq: Every day | ORAL | Status: DC
  Administered 2018-02-24: 5 mg via ORAL
  Filled 2018-02-23: qty 1

## 2018-02-23 MED ORDER — FENTANYL CITRATE (PF) 100 MCG/2ML IJ SOLN
INTRAMUSCULAR | Status: AC
  Filled 2018-02-23: qty 2

## 2018-02-23 MED ORDER — LIDOCAINE-EPINEPHRINE (PF) 1 %-1:200000 IJ SOLN
INTRAMUSCULAR | Status: AC
  Filled 2018-02-23: qty 20

## 2018-02-23 MED ORDER — MIDAZOLAM HCL 2 MG/2ML IJ SOLN
INTRAMUSCULAR | Status: DC | PRN
  Administered 2018-02-23 (×2): 1 mg via INTRAVENOUS

## 2018-02-23 MED ORDER — MIDAZOLAM HCL 5 MG/5ML IJ SOLN
INTRAMUSCULAR | Status: AC
  Filled 2018-02-23: qty 5

## 2018-02-23 MED ORDER — HEPARIN (PORCINE) IN NACL 1000-0.9 UT/500ML-% IV SOLN
INTRAVENOUS | Status: AC
  Filled 2018-02-23: qty 500

## 2018-02-23 MED ORDER — INSULIN DETEMIR 100 UNIT/ML ~~LOC~~ SOLN
10.0000 [IU] | Freq: Every day | SUBCUTANEOUS | Status: DC
  Administered 2018-02-23: 10 [IU] via SUBCUTANEOUS
  Filled 2018-02-23 (×2): qty 0.1

## 2018-02-23 SURGICAL SUPPLY — 7 items
CATH PALINDROME RT-P 15FX19CM (CATHETERS) ×2 IMPLANT
DERMABOND ADVANCED (GAUZE/BANDAGES/DRESSINGS) ×1
DERMABOND ADVANCED .7 DNX12 (GAUZE/BANDAGES/DRESSINGS) ×1 IMPLANT
PACK ANGIOGRAPHY (CUSTOM PROCEDURE TRAY) ×2 IMPLANT
SUT MNCRL AB 4-0 PS2 18 (SUTURE) ×2 IMPLANT
SUT PROLENE 0 CT 1 30 (SUTURE) ×2 IMPLANT
TOWEL OR 17X26 4PK STRL BLUE (TOWEL DISPOSABLE) ×2 IMPLANT

## 2018-02-23 NOTE — Progress Notes (Signed)
[facility] at [place] NAME: Cathy Cline    MR#:  [id number]  DATE OF BIRTH:  12/06/1955  SUBJECTIVE:  CHIEF COMPLAINT:   Chief Complaint  Patient presents with  . Abnormal Lab  Patient seen and evaluated today Conversation done with the help of family members Family is at bedside No abdominal pain Comfortable on oxygen by nasal cannula Permacath placement today  REVIEW OF SYSTEMS:    ROS  CONSTITUTIONAL: No documented fever. Has fatigue, weakness. No weight gain, no weight loss.  EYES: No blurry or double vision.  ENT: No tinnitus. No postnasal drip. No redness of the oropharynx.  RESPIRATORY: No cough, no wheeze, no hemoptysis. Has dyspnea.  CARDIOVASCULAR: No chest pain. No orthopnea. No palpitations. No syncope.  GASTROINTESTINAL: Decreased nausea, decreased vomiting , no diarrhea.  Decreased abdominal pain. No melena or hematochezia.  GENITOURINARY: No dysuria or hematuria.  ENDOCRINE: No polyuria or nocturia. No heat or cold intolerance.  HEMATOLOGY: No anemia. No bruising. No bleeding.  INTEGUMENTARY: No rashes. No lesions.  MUSCULOSKELETAL: No arthritis. No swelling. No gout.  NEUROLOGIC: No numbness, tingling, or ataxia. No seizure-type activity.  PSYCHIATRIC: No anxiety. No insomnia. No ADD.   DRUG ALLERGIES:  No Known Allergies  VITALS:  Blood pressure 109/64, pulse 87, temperature 98.4 F (36.9 C), temperature source Oral, resp. rate 18, height 4\' 11"  (1.499 m), weight 55.9 kg, SpO2 98 %.  PHYSICAL EXAMINATION:   Physical Exam  GENERAL:  62 y.o.-year-old patient lying in the bed with no acute distress.  EYES: Pupils equal, round, reactive to light and accommodation. No scleral icterus. Extraocular muscles intact.  HEENT: Head atraumatic, normocephalic. Oropharynx dry and nasopharynx clear.  NECK:  Supple, no jugular venous distention. No thyroid enlargement, no tenderness.  LUNGS: Improved breath sounds  bilaterally, bilateral rhonchi heard. No use of accessory muscles of respiration.  Off BiPAP CARDIOVASCULAR: S1, S2 tachycardia noted. No murmurs, rubs, or gallops.  ABDOMEN: Soft , periumbilical tenderness decreased, nondistended. Bowel sounds present. No organomegaly or mass.  EXTREMITIES: No cyanosis, clubbing or edema b/l.    NEUROLOGIC: Cranial nerves II through XII are intact. No focal Motor or sensory deficits b/l.   PSYCHIATRIC: The patient is alert and oriented x 3.  SKIN: No obvious rash, lesion, or ulcer.   LABORATORY PANEL:   CBC Recent Labs  Lab 02/23/18 0421  WBC 16.8*  HGB 8.3*  HCT 26.2*  PLT 104*   ------------------------------------------------------------------------------------------------------------------ Chemistries  Recent Labs  Lab 02/19/18 0417  02/20/18 0416  02/23/18 0421  NA 138   < > 139   < > 137  K 3.3*   < > 3.5   < > 3.7  CL 97*   < > 97*   < > 99  CO2 30   < > 28   < > 30  GLUCOSE 188*   < > 129*   < > 150*  BUN 60*   < > 81*   < > 34*  CREATININE 3.47*   < > 4.03*   < > 2.84*  CALCIUM 8.2*   < > 8.1*   < > 7.6*  MG 2.1  --   --   --   --   AST 149*  --  78*  --   --   ALT 118*  --  78*  --   --   ALKPHOS 212*  --  159*  --   --   BILITOT  0.5  --  0.7  --   --    < > = values in this interval not displayed.   ------------------------------------------------------------------------------------------------------------------  Cardiac Enzymes No results for input(s): TROPONINI in the last 168 hours. ------------------------------------------------------------------------------------------------------------------  RADIOLOGY:  No results found.   ASSESSMENT AND PLAN:   62 year old female patient with with history of hypertension, hyperlipidemia, type 2 diabetes mellitus currently in ICU for sepsis and renal failure  -Acute kidney injury and renal failure secondary to ATN from sepsis Needs dialysis long-term and as  outpatient Vascular surgery to put permacath for access today Appreciate vascular surgery follow-up Nephrology follow-up appreciated  -Status post septic shock  -Sepsis secondary to pyelonephritis from E. coli improved Patient switched to oral antibiotics based on culture and sensitivity  -Hyperglycemia improved Weaned off insulin drip Probably secondary to steroids given before dialysis sessions secondary to allergic reaction  -Metabolic acidosis improved  -Acute pulmonary edema and volume overload improved  -E. coli urinary tract infection Oral antibiotic to continue  -Status post respiratory failure Off BiPAP Continue oxygen via nasal cannula  -Acute hyperkalemia resolved  -Cholecystitis Follow-up LFTs No surgical intervention as of now as per surgical consultant HIDA scan suggestive of nonvisualization of gallbladder consistent with cholecystitis In view of sepsis, renal failure and respiratory failure no surgery recommended at this point Continue antibiotic therapy Percutaneous cholecystostomy tube also not recommended at this time  -Leukocytosis improved secondary to steroids given prior to dialysis No evidence of any fever, infection  -Anemia appears chronic Secondary to renovascular disease Monitor hemoglobin hematocrit  All the records are reviewed and case discussed with Care Management/Social Worker. Management plans discussed with the patient, family and they are in agreement.  CODE STATUS: Full code  DVT Prophylaxis: SCDs  TOTAL TIME TAKING CARE OF THIS PATIENT: 24 minutes.   POSSIBLE D/C IN  1  DAYS, DEPENDING ON CLINICAL CONDITION.  Saundra Shelling M.D on 02/23/2018 at 2:40 PM  Between 7am to 6pm - Pager - [phone]  After 6pm go to [email or web address] - password [email or web address] [facility]  Office  [phone]  CC: Primary care physician; Petra Kuba, MD  Note: This dictation was prepared with Dragon dictation along with  smaller phrase technology. Any transcriptional errors that result from this process are unintentional.

## 2018-02-23 NOTE — Progress Notes (Signed)
Inpatient Diabetes Program Recommendations  AACE/ADA: New Consensus Statement on Inpatient Glycemic Control (2019)  Target Ranges:  Prepandial:   less than 140 mg/dL      Peak postprandial:   less than 180 mg/dL (1-2 hours)      Critically ill patients:  140 - 180 mg/dL  Results for HENDRIX, CONSOLE (MRN [id number]) as of 02/23/2018 11:43  Ref. Range 02/22/2018 07:52 02/22/2018 15:28 02/22/2018 16:51 02/22/2018 20:57 02/23/2018 07:28 02/23/2018 08:15 02/23/2018 11:22  Glucose-Capillary Latest Ref Range: 70 - 99 mg/dL 277 (H)  Novolog 8 units 143 (H) 200 (H)  Novolog 3 units 283 (H)  Novolog 3 units  Levemir 14 units 115 (H) 108 (H) 121 (H)    Review of Glycemic Control  Diabetes history: DM2 Outpatient Diabetes medications: Levemir 25 units QAM, Levemir 20 units QPM, Metformin XR 1000 mg BID Current orders for Inpatient glycemic control: Levemir 14 units BID, Novolog 0-15 units TID with meals, Novolog 0-5 units QHS  Inpatient Diabetes Program Recommendations:   Insulin - Basal: Patient received Levemir 14 units one time on 02/22/18 (at 21:11) and fasting glucose 115 mg/dl this morning. Please consider decreasing Levemir to 10 units QHS.  HgbA1C: A1C 9.4% on 02/14/18 indicating an average glucose of 223 mg/dl over the past 2-3 months. Diabetes Coordinator spoke with patient at length on 02/15/18.  Thanks, Barnie Alderman, RN, MSN, CDE Diabetes Coordinator Inpatient Diabetes Program [phone] (Team Pager from 8am to 5pm)

## 2018-02-23 NOTE — Op Note (Signed)
OPERATIVE NOTE    PRE-OPERATIVE DIAGNOSIS: 1. ESRD   POST-OPERATIVE DIAGNOSIS: same as above  PROCEDURE: 1. Ultrasound guidance for vascular access to the right internal jugular vein 2. Fluoroscopic guidance for placement of catheter 3. Placement of a 19 cm tip to cuff tunneled hemodialysis catheter via the right internal jugular vein  SURGEON: Leotis Pain, MD  ANESTHESIA:  Local with Moderate conscious sedation for approximately 15 minutes using 2 mg of Versed and 50 mcg of Fentanyl  ESTIMATED BLOOD LOSS: 5 cc  FLUORO TIME: less than one minute  CONTRAST: none  FINDING(S): 1.  Patent right internal jugular vein  SPECIMEN(S):  None  INDICATIONS:   Cathy Cline is a 62 y.o.female who presents with renal failure.  The patient needs long term dialysis access for their ESRD, and a Permcath is necessary.  Risks and benefits are discussed and informed consent is obtained.    DESCRIPTION: After obtaining full informed written consent, the patient was brought back to the vascular suited. The patient's right neck and chest were sterilely prepped and draped in a sterile surgical field was created. Moderate conscious sedation was administered during a face to face encounter with the patient throughout the procedure with my supervision of the RN administering medicines and monitoring the patient's vital signs, pulse oximetry, telemetry and mental status throughout from the start of the procedure until the patient was taken to the recovery room.  The right internal jugular vein was visualized with ultrasound and found to be patent. It was then accessed under direct ultrasound guidance and a permanent image was recorded. A wire was placed. After skin nick and dilatation, the peel-away sheath was placed over the wire. I then turned my attention to an area under the clavicle. Approximately 1-2 fingerbreadths below the clavicle a small counterincision was created and tunneled from the  subclavicular incision to the access site. Using fluoroscopic guidance, a 19 centimeter tip to cuff tunneled hemodialysis catheter was selected, and tunneled from the subclavicular incision to the access site. It was then placed through the peel-away sheath and the peel-away sheath was removed. Using fluoroscopic guidance the catheter tips were parked in the right atrium. The appropriate distal connectors were placed. It withdrew blood well and flushed easily with heparinized saline and a concentrated heparin solution was then placed. It was secured to the chest wall with 2 Prolene sutures. The access incision was closed single 4-0 Monocryl. A 4-0 Monocryl pursestring suture was placed around the exit site. Sterile dressings were placed. The patient tolerated the procedure well and was taken to the recovery room in stable condition.  COMPLICATIONS: None  CONDITION: Stable  Leotis Pain, MD 02/23/2018 11:34 AM   This note was created with Dragon Medical transcription system. Any errors in dictation are purely unintentional.

## 2018-02-23 NOTE — H&P (Signed)
[facility] History & Physical Update  The patient was interviewed and re-examined.  The patient's previous History and Physical has been reviewed and is unchanged.  There is no change in the plan of care. We plan to proceed with the scheduled procedure.  Leotis Pain, MD  02/23/2018, 10:12 AM

## 2018-02-24 LAB — GLUCOSE, CAPILLARY: Glucose-Capillary: 110 mg/dL — ABNORMAL HIGH (ref 70–99)

## 2018-02-24 MED ORDER — AMLODIPINE BESYLATE 5 MG PO TABS: 5.0000 mg | ORAL_TABLET | Freq: Every day | ORAL | 0 refills | Status: AC

## 2018-02-24 MED ORDER — RENA-VITE PO TABS: 1.0000 | ORAL_TABLET | Freq: Every day | ORAL | 0 refills | Status: AC

## 2018-02-24 MED ORDER — AMOXICILLIN-POT CLAVULANATE 500-125 MG PO TABS: 1.0000 | ORAL_TABLET | Freq: Every day | ORAL | 0 refills | Status: DC

## 2018-02-24 MED ORDER — NEPRO/CARBSTEADY PO LIQD: 237.0000 mL | Freq: Two times a day (BID) | ORAL | 0 refills | Status: DC

## 2018-02-24 MED ORDER — RENA-VITE PO TABS: 1.0000 | ORAL_TABLET | Freq: Every day | ORAL | 0 refills | Status: DC

## 2018-02-24 MED ORDER — AMOXICILLIN-POT CLAVULANATE 500-125 MG PO TABS: 1.0000 | ORAL_TABLET | Freq: Every day | ORAL | 0 refills | Status: AC

## 2018-02-24 MED ORDER — NEPRO/CARBSTEADY PO LIQD: 237.0000 mL | Freq: Two times a day (BID) | ORAL | 0 refills | Status: AC

## 2018-02-24 MED ORDER — DOCUSATE SODIUM 100 MG PO CAPS: 100.0000 mg | ORAL_CAPSULE | Freq: Two times a day (BID) | ORAL | 0 refills | Status: DC

## 2018-02-24 NOTE — Progress Notes (Signed)
Post dialysis assessment 

## 2018-02-24 NOTE — Progress Notes (Signed)
Pt. tolarted tx. Well, no c/o post , was discharged in stable condition.   02/24/18 1435  Post-Hemodialysis Assessment  Rinseback Volume (mL) 250 mL  KECN 52.6 V  Dialyzer Clearance Lightly streaked  Duration of HD Treatment -hour(s) 3 hour(s)  Hemodialysis Intake (mL) 0 mL  UF Total -Machine (mL) 1000 mL  Net UF (mL) 1000 mL  Tolerated HD Treatment Yes  Post-Hemodialysis Comments pt. stable, no c/o

## 2018-02-24 NOTE — Progress Notes (Signed)
Pt. rersting comfortably, denies c/o .   02/24/18 1315  Vital Signs  BP Location Right Leg  BP Method Automatic  Patient Position (if appropriate) Lying  During Hemodialysis Assessment  Blood Flow Rate (mL/min) 300 mL/min  Arterial Pressure (mmHg) 120 mmHg  Venous Pressure (mmHg) 80 mmHg  Transmembrane Pressure (mmHg) 50 mmHg  Ultrafiltration Rate (mL/min) 330 mL/min  Dialysate Flow Rate (mL/min) 600 ml/min  Conductivity: Machine  13.7  HD Safety Checks Performed Yes  Intra-Hemodialysis Comments Progressing as prescribed

## 2018-02-24 NOTE — Progress Notes (Signed)
Resting comfortably.

## 2018-02-24 NOTE — Progress Notes (Signed)
Resting comfortably, denies complains.   02/24/18 1215  Vital Signs  BP Location Right Arm  BP Method Automatic  Patient Position (if appropriate) Lying  During Hemodialysis Assessment  Blood Flow Rate (mL/min) 300 mL/min  Arterial Pressure (mmHg) 110 mmHg  Venous Pressure (mmHg) 80 mmHg  Transmembrane Pressure (mmHg) 50 mmHg  Ultrafiltration Rate (mL/min) 330 mL/min  Dialysate Flow Rate (mL/min) 600 ml/min  Conductivity: Machine  14  HD Safety Checks Performed Yes  Intra-Hemodialysis Comments Progressing as prescribed

## 2018-02-24 NOTE — Progress Notes (Signed)
Pt. Stable , no c/o

## 2018-02-24 NOTE — Progress Notes (Signed)
02/24/2018 15:15  Received report from hemodialysis RN.  Patient received full treatment up in recliner and 1L fluid was removed.  Informed that patient will be returning shortly.  [name], RN

## 2018-02-24 NOTE — Progress Notes (Signed)
Resting comfortably, denies   02/24/18 1300  Vital Signs  BP Location Right Arm  BP Method Automatic  Patient Position (if appropriate) Lying  During Hemodialysis Assessment  Blood Flow Rate (mL/min) 300 mL/min  Arterial Pressure (mmHg) 120 mmHg  Venous Pressure (mmHg) 80 mmHg  Transmembrane Pressure (mmHg) 50 mmHg  Ultrafiltration Rate (mL/min) 330 mL/min  Dialysate Flow Rate (mL/min) 600 ml/min  Conductivity: Machine  13.8  HD Safety Checks Performed Yes  Intra-Hemodialysis Comments Progressing as prescribed   c/o

## 2018-02-24 NOTE — Progress Notes (Signed)
[facility] Daily Progress Note    Vascular Surgery Communication Note  Subjective: 1 Day Post-Op: Ultrasound guidance for vascular access to the right internal jugular vein, Fluoroscopic guidance for placement of catheter, Placement of a 19 cm tip to cuff tunneled hemodialysis catheter via the right internal jugular vein  Permcath placed. Patient to follow up as outpatient to undergo vein mapping if a more permanent placement is needed. Will place follow up instructions. Vascular Surgery to sign off at this time. Please reconsult if needed. Thank you.   Discussed with Dr. Ellis Parents Timberlyn Pickford PA-C 02/24/2018 11:13 AM

## 2018-02-24 NOTE — Progress Notes (Signed)
02/24/2018 7:10 PM  Dr. Estanislado Pandy e-scribed preciptions to patient pharmacy on file.  Pharmacy was closed at time of discharge.  Dr. Margaretmary Eddy printed on behalf of Dr. Estanislado Pandy.  [name], RN

## 2018-02-24 NOTE — Progress Notes (Signed)
Pt. Resting comfortable

## 2018-02-24 NOTE — Progress Notes (Signed)
Pre dialysis assessment 

## 2018-02-24 NOTE — Progress Notes (Signed)
Sleeping comfortably    02/24/18 1130  Vital Signs  Pulse Rate 83  Pulse Rate Source Dinamap  Resp 15  BP Location Right Arm  BP Method Automatic  Patient Position (if appropriate) Lying  During Hemodialysis Assessment  Blood Flow Rate (mL/min) 300 mL/min  Arterial Pressure (mmHg) 110 mmHg  Venous Pressure (mmHg) 70 mmHg  Transmembrane Pressure (mmHg) 60 mmHg  Ultrafiltration Rate (mL/min) 330 mL/min  Dialysate Flow Rate (mL/min) 600 ml/min  Conductivity: Machine  13.8  HD Safety Checks Performed Yes  Intra-Hemodialysis Comments Progressing as prescribed

## 2018-02-24 NOTE — Progress Notes (Signed)
Pt. resting   02/24/18 1203  Vital Signs  BP 115/69  BP Location Right Arm  BP Method Automatic  Patient Position (if appropriate) Lying  During Hemodialysis Assessment  Blood Flow Rate (mL/min) 300 mL/min  Arterial Pressure (mmHg) 120 mmHg  Venous Pressure (mmHg) 80 mmHg  Transmembrane Pressure (mmHg) 60 mmHg  Ultrafiltration Rate (mL/min) 330 mL/min  Dialysate Flow Rate (mL/min) 600 ml/min  Conductivity: Machine  13.6  HD Safety Checks Performed Yes  Intra-Hemodialysis Comments Progressing as prescribed   comfortably , no c/o

## 2018-02-24 NOTE — Progress Notes (Signed)
°   02/24/18 1119  During Hemodialysis Assessment  Blood Flow Rate (mL/min) 300 mL/min  Arterial Pressure (mmHg) 110 mmHg  Venous Pressure (mmHg) 70 mmHg  Transmembrane Pressure (mmHg) 60 mmHg  Ultrafiltration Rate (mL/min) 330 mL/min  Dialysate Flow Rate (mL/min) 600 ml/min  Conductivity: Machine  13.9  HD Safety Checks Performed Yes  Dialysis Fluid Bolus Normal Saline  Bolus Amount (mL) 250 mL  Pt. On 2l/min oxygen via n/c , not on distress, tx started with no problem

## 2018-02-24 NOTE — Progress Notes (Signed)
Pt. Denies c/o    02/24/18 1145  Vital Signs  BP Location Right Arm  BP Method Automatic  Patient Position (if appropriate) Lying  During Hemodialysis Assessment  Blood Flow Rate (mL/min) 300 mL/min  Arterial Pressure (mmHg) 110 mmHg  Venous Pressure (mmHg) 80 mmHg  Transmembrane Pressure (mmHg) 60 mmHg  Ultrafiltration Rate (mL/min) 330 mL/min  Dialysate Flow Rate (mL/min) 600 ml/min  Conductivity: Machine  13.7  HD Safety Checks Performed Yes  Intra-Hemodialysis Comments Progressing as prescribed

## 2018-02-24 NOTE — Progress Notes (Signed)
°   02/24/18 1330  Vital Signs  BP Location Right Arm  BP Method Automatic  Patient Position (if appropriate) Lying  During Hemodialysis Assessment  Blood Flow Rate (mL/min) 300 mL/min  Arterial Pressure (mmHg) 120 mmHg  Venous Pressure (mmHg) 80 mmHg  Transmembrane Pressure (mmHg) 50 mmHg  Ultrafiltration Rate (mL/min) 330 mL/min  Dialysate Flow Rate (mL/min) 600 ml/min  Conductivity: Machine  13.7  HD Safety Checks Performed Yes  Intra-Hemodialysis Comments Progressing as prescribed

## 2018-02-24 NOTE — Progress Notes (Signed)
Pt. Tolerated tx well, discharged from acute room with no problems   02/24/18 1425  Vital Signs  BP 105/76  BP Location Right Arm  BP Method Automatic  Patient Position (if appropriate) Lying  During Hemodialysis Assessment  Blood Flow Rate (mL/min) 300 mL/min  Arterial Pressure (mmHg) 120 mmHg  Venous Pressure (mmHg) 90 mmHg  Transmembrane Pressure (mmHg) 50 mmHg  Ultrafiltration Rate (mL/min) 330 mL/min  Dialysate Flow Rate (mL/min) 600 ml/min  Conductivity: Machine  13.6  HD Safety Checks Performed Yes  Intra-Hemodialysis Comments Tolerated well;Tx completed

## 2018-02-24 NOTE — Progress Notes (Signed)
Pt watching TV.   02/24/18 1400  Vital Signs  BP Location Right Arm  BP Method Automatic  Patient Position (if appropriate) Lying  During Hemodialysis Assessment  Blood Flow Rate (mL/min) 300 mL/min  Arterial Pressure (mmHg) 120 mmHg  Venous Pressure (mmHg) 80 mmHg  Transmembrane Pressure (mmHg) 50 mmHg  Ultrafiltration Rate (mL/min) 330 mL/min  Dialysate Flow Rate (mL/min) 600 ml/min  Conductivity: Machine  13.7  HD Safety Checks Performed Yes  Intra-Hemodialysis Comments Progressing as prescribed

## 2018-02-24 NOTE — Progress Notes (Signed)
[facility] [place] NAME: Cathy Cline    MR#:  [id number]  DATE OF BIRTH:  07/11/1955  SUBJECTIVE:  CHIEF COMPLAINT:   Chief Complaint  Patient presents with  . Abnormal Lab  Patient seen and evaluated today Conversation done with the help of family members Family is at bedside No abdominal pain No fever Completely weaned off oxygen Permacath placed  REVIEW OF SYSTEMS:    ROS  CONSTITUTIONAL: No documented fever. Has fatigue, weakness. No weight gain, no weight loss.  EYES: No blurry or double vision.  ENT: No tinnitus. No postnasal drip. No redness of the oropharynx.  RESPIRATORY: No cough, no wheeze, no hemoptysis. Has dyspnea.  CARDIOVASCULAR: No chest pain. No orthopnea. No palpitations. No syncope.  GASTROINTESTINAL: Decreased nausea, decreased vomiting , no diarrhea.  Decreased abdominal pain. No melena or hematochezia.  GENITOURINARY: No dysuria or hematuria.  ENDOCRINE: No polyuria or nocturia. No heat or cold intolerance.  HEMATOLOGY: No anemia. No bruising. No bleeding.  INTEGUMENTARY: No rashes. No lesions.  MUSCULOSKELETAL: No arthritis. No swelling. No gout.  NEUROLOGIC: No numbness, tingling, or ataxia. No seizure-type activity.  PSYCHIATRIC: No anxiety. No insomnia. No ADD.   DRUG ALLERGIES:  No Known Allergies  VITALS:  Blood pressure 104/74, pulse 80, temperature 97.9 F (36.6 C), temperature source Oral, resp. rate 15, height 4\' 11"  (1.499 m), weight 60.4 kg, SpO2 98 %.  PHYSICAL EXAMINATION:   Physical Exam    GENERAL:  62 y.o.-year-old patient lying in the bed with no acute distress.  EYES: Pupils equal, round, reactive to light and accommodation. No scleral icterus. Extraocular muscles intact.  HEENT: Head atraumatic, normocephalic. Oropharynx dry and nasopharynx clear.  NECK:  Supple, no jugular venous distention. No thyroid enlargement, no tenderness.  LUNGS: Improved breath sounds  bilaterally, bilateral rhonchi improved. No use of accessory muscles of respiration.  CARDIOVASCULAR: S1, S2 tachycardia noted. No murmurs, rubs, or gallops.  ABDOMEN: Soft , no tenderness, nondistended. Bowel sounds present. No organomegaly or mass.  EXTREMITIES: No cyanosis, clubbing or edema b/l.    NEUROLOGIC: Cranial nerves II through XII are intact. No focal Motor or sensory deficits b/l.   PSYCHIATRIC: The patient is alert and oriented x 3.  SKIN: No obvious rash, lesion, or ulcer.   LABORATORY PANEL:   CBC Recent Labs  Lab 02/23/18 0421  WBC 16.8*  HGB 8.3*  HCT 26.2*  PLT 104*   ------------------------------------------------------------------------------------------------------------------ Chemistries  Recent Labs  Lab 02/19/18 0417  02/20/18 0416  02/23/18 0421  NA 138   < > 139   < > 137  K 3.3*   < > 3.5   < > 3.7  CL 97*   < > 97*   < > 99  CO2 30   < > 28   < > 30  GLUCOSE 188*   < > 129*   < > 150*  BUN 60*   < > 81*   < > 34*  CREATININE 3.47*   < > 4.03*   < > 2.84*  CALCIUM 8.2*   < > 8.1*   < > 7.6*  MG 2.1  --   --   --   --   AST 149*  --  78*  --   --   ALT 118*  --  78*  --   --   ALKPHOS 212*  --  159*  --   --   BILITOT 0.5  --  0.7  --   --    < > = values in this interval not displayed.   ------------------------------------------------------------------------------------------------------------------  Cardiac Enzymes No results for input(s): TROPONINI in the last 168 hours. ------------------------------------------------------------------------------------------------------------------  RADIOLOGY:  No results found.   ASSESSMENT AND PLAN:   62 year old female patient with with history of hypertension, hyperlipidemia, type 2 diabetes mellitus currently in ICU for sepsis and renal failure  -Acute kidney injury and renal failure secondary to ATN from sepsis Needs dialysis long-term and as outpatient Vascular surgery has put perma  cath Awaiting insurance approval for out patient dialysis Appreciate vascular surgery follow-up Nephrology follow-up appreciated  -Status post septic shock  -Sepsis secondary to pyelonephritis from E. coli improved Patient switched to oral antibiotics based on culture and sensitivity  -Hyperglycemia improved Weaned off insulin drip Probably secondary to steroids given before dialysis sessions secondary to allergic reaction  -Metabolic acidosis improved  -Acute pulmonary edema and volume overload improved  -E. coli urinary tract infection improved Oral antibiotic to continue  -Status post respiratory failure Off BiPAP Continue oxygen via nasal cannula  -Acute hyperkalemia resolved  -Cholecystitis improved Follow-up LFTs No surgical intervention as of now as per surgical consultant HIDA scan suggestive of nonvisualization of gallbladder consistent with cholecystitis In view of sepsis, renal failure and respiratory failure no surgery recommended at this point Continue antibiotic therapy Percutaneous cholecystostomy tube also not recommended at this time  -Leukocytosis secondary to steroids better No evidence of any fever, infection  -Anemia appears chronic Secondary to renovascular disease Monitor hemoglobin hematocrit  -Disposition Awaiting insurance authorization for outpatient dialysis  All the records are reviewed and case discussed with Care Management/Social Worker. Management plans discussed with the patient, family and they are in agreement.  CODE STATUS: Full code  DVT Prophylaxis: SCDs  TOTAL TIME TAKING CARE OF THIS PATIENT: 24 minutes.   POSSIBLE D/C IN  1 to 2 DAYS, DEPENDING ON CLINICAL CONDITION.  Saundra Shelling M.D on 02/24/2018 at 2:20 PM  Between 7am to 6pm - Pager - [phone]  After 6pm go to [email or web address] - password [email or web address] [facility]  Office  [phone]  CC: Primary care physician; Petra Kuba,  MD  Note: This dictation was prepared with Dragon dictation along with smaller phrase technology. Any transcriptional errors that result from this process are unintentional.

## 2018-02-24 NOTE — Progress Notes (Signed)
02/24/2018 19:30  Cathy Cline to be D/C'd Home per MD order.  Discussed prescriptions and follow up appointments with the patient. Prescriptions given to patient, medication list explained in detail. Pt verbalized understanding.  Allergies as of 02/24/2018   No Known Allergies     Medication List    STOP taking these medications   atorvastatin 40 MG tablet Commonly known as:  LIPITOR   losartan-hydrochlorothiazide 100-25 MG tablet Commonly known as:  HYZAAR   metFORMIN 500 MG 24 hr tablet Commonly known as:  GLUCOPHAGE-XR     TAKE these medications   amLODipine 5 MG tablet Commonly known as:  NORVASC Take 1 tablet (5 mg total) by mouth daily. What changed:    medication strength  how much to take   amoxicillin-clavulanate 500-125 MG tablet Commonly known as:  AUGMENTIN Take 1 tablet (500 mg total) by mouth at bedtime for 4 days.   aspirin 81 MG tablet Take 81 mg by mouth daily.   cetirizine 10 MG tablet Commonly known as:  ZYRTEC Take 10 mg by mouth at bedtime.   docusate sodium 100 MG capsule Commonly known as:  COLACE Take 1 capsule (100 mg total) by mouth 2 (two) times daily.   feeding supplement (NEPRO CARB STEADY) Liqd Take 237 mLs by mouth 2 (two) times daily between meals for 15 days.   insulin detemir 100 UNIT/ML injection Commonly known as:  LEVEMIR Inject 20-25 Units into the skin See admin instructions. Inject 25u under the skin every morning and inject 20u under the skin ever evening   metoprolol tartrate 25 MG tablet Commonly known as:  LOPRESSOR Take 25 mg by mouth 2 (two) times daily.   multivitamin Tabs tablet Take 1 tablet by mouth at bedtime.            Durable Medical Equipment  (From admission, onward)         Start     Ordered   02/24/18 1633  For home use only DME Walker rolling  Once    Question:  Patient needs a walker to treat with the following condition  Answer:  Gait instability   02/24/18 1632           Vitals:   02/24/18 1435 02/24/18 1548  BP: 97/69 (!) 172/67  Pulse: 95 93  Resp: (!) 22 16  Temp: (!) 96.6 F (35.9 C) 98.3 F (36.8 C)  SpO2:  90%    Skin clean, dry and intact without evidence of skin break down, no evidence of skin tears noted. IV catheter discontinued intact. Site without signs and symptoms of complications. Dressing and pressure applied. Pt denies pain at this time. No complaints noted.  An After Visit Summary was printed and given to the patient. Patient escorted via [facility], and D/C home via private auto.  Cathy Cline

## 2018-02-24 NOTE — Progress Notes (Signed)
[facility], [place] 02/24/18  Subjective:  Patient remains dialysis dependent at the moment. Right IJ permacath placed. Appreciate vascular surgery assistance.  Objective:  Vital signs in last 24 hours:  Temp:  [97.8 F (36.6 C)-98.8 F (37.1 C)] 97.9 F (36.6 C) (12/06 1100) Pulse Rate:  [76-96] 84 (12/06 1100) Resp:  [16-20] 18 (12/06 1100) BP: (94-183)/(48-69) 125/69 (12/06 1100) SpO2:  [93 %-98 %] 98 % (12/06 1100) Weight:  [60.4 kg] 60.4 kg (12/06 0610)  Weight change: 0.017 kg Filed Weights   02/23/18 0454 02/23/18 0819 02/24/18 0610  Weight: 55.9 kg 55.9 kg 60.4 kg    Intake/Output:    Intake/Output Summary (Last 24 hours) at 02/24/2018 1155 Last data filed at 02/24/2018 1002 Gross per 24 hour  Intake 580 ml  Output 0 ml  Net 580 ml     Physical Exam: General:  No acute distress  HEENT  OM moist hearing intact  Neck  supple  Pulm/lungs  CTAB,  Normal effort  CVS/Heart  regular, S1S2  Abdomen:   Mild tenderness, no rebound, BS present  Extremities:  no edema  Neurologic:  Alert, follows commands, conversant  Skin:  Normal turgor, no acute rashes  Access:   L IJ temp HD catheter, R IJ PC       Basic Metabolic Panel:  Recent Labs  Lab 02/17/18 2028  02/18/18 0845 02/18/18 1643 02/19/18 0417 02/19/18 1603 02/20/18 0416 02/21/18 1140 02/22/18 0500 02/22/18 1250 02/23/18 0421  NA 139   < >  --  137 138 136 139 136 135  --  137  K 4.4   < >  --  3.9 3.3* 3.6 3.5 4.4 4.5  --  3.7  CL 91*   < >  --  93* 97* 94* 97* 96* 97*  --  99  CO2 28   < >  --  29 30 28 28 26 27   --  30  GLUCOSE 193*   < >  --  335* 188* 217* 129* 187* 289*  --  150*  BUN 59*   < >  --  46* 60* 75* 81* 60* 72*  --  34*  CREATININE 4.12*   < >  --  3.12* 3.47* 3.81* 4.03* 3.53* 4.04*  --  2.84*  CALCIUM 7.6*   < >  --  7.9* 8.2* 8.2* 8.1* 7.9* 7.8*  --  7.6*  MG 2.0  --  2.1 2.0 2.1  --   --   --   --   --   --   PHOS 5.0*  --  6.5* 4.2 4.0  --    --   --   --  4.5  --    < > = values in this interval not displayed.     CBC: Recent Labs  Lab 02/18/18 0354 02/19/18 0417 02/20/18 0416 02/22/18 0500 02/23/18 0421  WBC 19.0* 25.1* 20.4* 21.6* 16.8*  NEUTROABS 17.9* 23.5* 17.9*  --   --   HGB 8.9* 9.1* 9.4* 8.0* 8.3*  HCT 26.8* 27.6* 29.1* 25.3* 26.2*  MCV 81.0 80.9 82.2 83.5 84.0  PLT 119* 88* 76* 77* 104*      Lab Results  Component Value Date   HEPBSAG Negative 02/21/2018   HEPBSAB Reactive 02/21/2018      Microbiology:  Recent Results (from the past 240 hour(s))  Urine culture     Status: Abnormal   Collection Time: 02/14/18  6:44 PM  Result Value  Ref Range Status   Specimen Description   Final    URINE, RANDOM Performed at [facility], [address] [place], [place] [address]    Special Requests   Final    NONE Performed at [facility], [address] [place], [place] [address]    Culture >=100,000 COLONIES/mL ESCHERICHIA COLI (A)  Final   Report Status 02/17/2018 FINAL  Final   Organism ID, Bacteria ESCHERICHIA COLI (A)  Final      Susceptibility   Escherichia coli - MIC*    AMPICILLIN <=2 SENSITIVE Sensitive     CEFAZOLIN <=4 SENSITIVE Sensitive     CEFTRIAXONE <=1 SENSITIVE Sensitive     CIPROFLOXACIN <=0.25 SENSITIVE Sensitive     GENTAMICIN <=1 SENSITIVE Sensitive     IMIPENEM <=0.25 SENSITIVE Sensitive     NITROFURANTOIN <=16 SENSITIVE Sensitive     TRIMETH/SULFA <=20 SENSITIVE Sensitive     AMPICILLIN/SULBACTAM <=2 SENSITIVE Sensitive     PIP/TAZO <=4 SENSITIVE Sensitive     Extended ESBL NEGATIVE Sensitive     * >=100,000 COLONIES/mL ESCHERICHIA COLI  MRSA PCR Screening     Status: None   Collection Time: 02/15/18  5:13 PM  Result Value Ref Range Status   MRSA by PCR NEGATIVE NEGATIVE Final    Comment:        The GeneXpert MRSA Assay (FDA approved for NASAL specimens only), is one component of a comprehensive MRSA colonization surveillance program. It is  not intended to diagnose MRSA infection nor to guide or monitor treatment for MRSA infections. Performed at [facility], [address] [place], [place] [address]   CULTURE, BLOOD (ROUTINE X 2) w Reflex to ID Panel     Status: None   Collection Time: 02/15/18  7:10 PM  Result Value Ref Range Status   Specimen Description BLOOD LEFT ANTECUBITAL  Final   Special Requests   Final    BOTTLES DRAWN AEROBIC AND ANAEROBIC Blood Culture adequate volume   Culture   Final    NO GROWTH 5 DAYS Performed at [facility], [address] [place], [place] [address]    Report Status 02/20/2018 FINAL  Final  CULTURE, BLOOD (ROUTINE X 2) w Reflex to ID Panel     Status: None   Collection Time: 02/15/18  7:25 PM  Result Value Ref Range Status   Specimen Description BLOOD BLOOD RIGHT HAND  Final   Special Requests   Final    BOTTLES DRAWN AEROBIC AND ANAEROBIC Blood Culture adequate volume   Culture   Final    NO GROWTH 5 DAYS Performed at [facility], [address], [place], [place] [address]    Report Status 02/20/2018 FINAL  Final  Urine Culture     Status: None   Collection Time: 02/15/18  8:10 PM  Result Value Ref Range Status   Specimen Description   Final    URINE, CATHETERIZED Performed at [facility], [address] [place], [place] [address]    Special Requests   Final    NONE Performed at [facility], [address], [place], [place] [address]    Culture   Final    NO GROWTH Performed at [facility], [place] [address] [employer] [place],  [address]    Report Status 02/17/2018 FINAL  Final  C difficile quick scan w PCR reflex     Status: None   Collection Time: 02/16/18  5:20 AM  Result Value Ref  Range Status   C Diff antigen NEGATIVE NEGATIVE Final   C Diff toxin NEGATIVE NEGATIVE Final   C Diff interpretation No C. difficile detected.  Final    Comment: Performed at [facility], [address]  [place], [place] [address]    Coagulation Studies: No results for input(s): LABPROT, INR in the last 72 hours.  Urinalysis: No results for input(s): COLORURINE, LABSPEC, PHURINE, GLUCOSEU, HGBUR, BILIRUBINUR, KETONESUR, PROTEINUR, UROBILINOGEN, NITRITE, LEUKOCYTESUR in the last 72 hours.  Invalid input(s): APPERANCEUR    Imaging: No results found.   Medications:   . sodium chloride 1,000 mL (02/23/18 0857)   . amLODipine  5 mg Oral Daily  . amoxicillin-clavulanate  1 tablet Oral QHS  . aspirin EC  81 mg Oral Daily  . docusate sodium  100 mg Oral BID  . feeding supplement (NEPRO CARB STEADY)  237 mL Oral BID BM  . insulin aspart  0-15 Units Subcutaneous TID WC  . insulin aspart  0-5 Units Subcutaneous QHS  . insulin detemir  10 Units Subcutaneous QHS  . loratadine  10 mg Oral Daily  . mouth rinse  15 mL Mouth Rinse q12n4p  . metoprolol tartrate  25 mg Oral BID  . multivitamin  1 tablet Oral QHS  . multivitamin with minerals  1 tablet Oral Daily  . pantoprazole  40 mg Oral Daily  . senna  1 tablet Oral QHS  . sodium chloride flush  10-40 mL Intracatheter Q12H   sodium chloride, acetaminophen **OR** acetaminophen, ipratropium-albuterol, labetalol, lactulose, ondansetron **OR** ondansetron (ZOFRAN) IV, oxyCODONE-acetaminophen, sodium chloride flush  Assessment/ Plan:  62 y.o.Hispanic female with diabetes, hypertension, coronary atherosclerosis, h/o MI, h/o coronary angiography 2017, followed by Dr Nehemiah Massed was admitted on 02/14/2018 with Acute pyelonephritis.   1.  Acute renal failure, ATN from severe sepsis.  No recent creatinine.  It was 0.80 from July 2015 2.  Hyperkalemia, now hypokalemia 3.  Acute pyelonephritis, sepsis (E. coli, urinary source) 4.  Metabolic Acidosis 5.  Acute pulmonary edema and volume overload 6.  DM-2 with CKD. HbA1c 9.4 % on 02/14/2018 7.  CKD, unspec. H/o proteinuria since 2015   ? If patient had delayed dialyzer reaction. Improved with Solumedrol  and iv benadryl Patient remains oliguric.   Plan: Overall renal function remains diminished.  No urine output recorded yesterday.  We plan for hemodialysis today.  Discontinue left internal jugular temporary dialysis catheter as right IJ PermCath has been placed.  Outpatient dialysis center placement is still pending at the moment.  Further plan as patient progresses.   LOS: 10 Jahmil Macleod 12/6/201911:55 AM  [place], [place]  Note: This note was prepared with Dragon dictation. Any transcription errors are unintentional

## 2018-02-24 NOTE — Care Management (Signed)
Patient to discharge today.  Per Cathy Cline HD liaison outpatient schedule is as follows [facility] TTS 11:45 and can start on Tuesday.  MD notified.  HD liaison has spoken with family, and confirmed transportion.   Per bedside RN Cathy Cline patient has tolerated sitting for HD, and maintains saturations on RA with exertion.   Patient has been ambulating around steady around the nursing station with a walker.  No home health PT indicated per nursing. RW ordered for discharge.  To be delivered to room.

## 2018-02-25 LAB — GLUCOSE, CAPILLARY: Glucose-Capillary: 136 mg/dL — ABNORMAL HIGH (ref 70–99)

## 2018-02-26 LAB — ABO/RH: ABO/RH(D): A POS

## 2018-03-01 NOTE — Discharge Summary (Signed)
[name] NAME: Cathy Cline    MR#:  [id number]  DATE OF BIRTH:  1955/12/27  DATE OF ADMISSION:  02/14/2018 ADMITTING PHYSICIAN: Harrie Foreman, MD  DATE OF DISCHARGE: 02/24/2018  8:58 PM  PRIMARY CARE PHYSICIAN: Petra Kuba, MD   ADMISSION DIAGNOSIS:  Pyelonephritis [N12] Acute renal failure, unspecified acute renal failure type ([place]) [N17.9] sepsis DISCHARGE DIAGNOSIS:  septic shock Acute Renal Failure Acute Respiratory failure Acute pyelonephritis Acute cholecystitis Metabolic acidosis Hyperglycemia Ecoli UTI Hypoxia  SECONDARY DIAGNOSIS:   Past Medical History:  Diagnosis Date  . Carotid stenosis   . Diabetes mellitus without complication ([place])   . Hypercholesterolemia   . Hypertension   . Myocardial infarction ([place])   . Tachycardia      ADMITTING HISTORY The patient with past medical history of hypertension, CAD and diabetes presents to the emergency department complaining of abdominal pain.  She was also told to be evaluated in the emergency department due to abnormal outpatient labs.  The patient reports nonbloody nonbilious nausea and vomiting.  She was found to have severe acute kidney injury.  CT of her abdomen showed perinephric stranding consistent with pyelonephritis.  The patient was given IV antibiotics prior to the emergency department staff calling the hospitalist service for admission.  HOSPITAL COURSE:  Patient was admitted to medical floor initially. Patient was started on broad spectrum antibiotics and iv fluids. Metformin was held in view of renal failure. Procalcitonin was followed up.Her renal failure worsened and and patient became more acidoitc and septic. She was moved to ICU. Neohrology and intensivist consultation was done.Patient was dialysed for renal failure from acute tubular necrosis from sepsis. She was also put on bipap for respiratory failure. Surgery consult doen  for cholecystitis who did not recommend any surgical intervention or any cholecystostomy procedure.LFT were closely followed up. Antibiotics were changed to IV rocephin based on culture and sensitivities.Ecoli noted in urine culture. Sepsis improved and hypoxia resolved. Acidosis improved. Patient moved to medical floor.Patient was worked up with Merlin stone protocol, renal ultrasound, HIDA scan, CT abdomen during hospitalization. She was switched to oral antibiotics and weaned off oxygen completely.She was also treated with insulin drip for elevated blood sugars in ICU. Vascular surgery was consulted and patient had perma cath placed successfully. Acute pulmonary edema and volume over load resolved with dialysis.Hyperkalemia from renal failure was also handled during hospitalization.Leucocytosis improved with resolution of sepsis, UTI infection.  Patient will be discharged to home with homw health services.  CONSULTS OBTAINED:  Treatment Team:  Murlean Iba, MD Abbie Sons, MD  DRUG ALLERGIES:  No Known Allergies  DISCHARGE MEDICATIONS:   Allergies as of 02/24/2018   No Known Allergies     Medication List    STOP taking these medications   atorvastatin 40 MG tablet Commonly known as:  LIPITOR   losartan-hydrochlorothiazide 100-25 MG tablet Commonly known as:  HYZAAR   metFORMIN 500 MG 24 hr tablet Commonly known as:  GLUCOPHAGE-XR     TAKE these medications   amLODipine 5 MG tablet Commonly known as:  NORVASC Take 1 tablet (5 mg total) by mouth daily. What changed:    medication strength  how much to take   aspirin 81 MG tablet Take 81 mg by mouth daily.   cetirizine 10 MG tablet Commonly known as:  ZYRTEC Take 10 mg by mouth at bedtime.   docusate sodium 100 MG capsule Commonly known as:  COLACE  Take 1 capsule (100 mg total) by mouth 2 (two) times daily.   feeding supplement (NEPRO CARB STEADY) Liqd Take 237 mLs by mouth 2 (two) times daily between meals  for 15 days.   insulin detemir 100 UNIT/ML injection Commonly known as:  LEVEMIR Inject 20-25 Units into the skin See admin instructions. Inject 25u under the skin every morning and inject 20u under the skin ever evening   metoprolol tartrate 25 MG tablet Commonly known as:  LOPRESSOR Take 25 mg by mouth 2 (two) times daily.   multivitamin Tabs tablet Take 1 tablet by mouth at bedtime.     ASK your doctor about these medications   amoxicillin-clavulanate 500-125 MG tablet Commonly known as:  AUGMENTIN Take 1 tablet (500 mg total) by mouth at bedtime for 4 days. Ask about: Should I take this medication?       Today  Patient seen on day of discharge No shortness of breath No fever Tolerating diet well  VITAL SIGNS:  Blood pressure (!) 172/67, pulse 93, temperature 98.3 F (36.8 C), temperature source Oral, resp. rate 16, height 4\' 11"  (1.499 m), weight 60.4 kg, SpO2 90 %.  I/O:  No intake or output data in the 24 hours ending 03/01/18 1538  PHYSICAL EXAMINATION:  Physical Exam  GENERAL:  62 y.o.-year-old patient lying in the bed with no acute distress.  LUNGS: Normal breath sounds bilaterally, no wheezing, rales,rhonchi or crepitation. No use of accessory muscles of respiration.  CARDIOVASCULAR: S1, S2 normal. No murmurs, rubs, or gallops.  ABDOMEN: Soft, non-tender, non-distended. Bowel sounds present. No organomegaly or mass.  NEUROLOGIC: Moves all 4 extremities. PSYCHIATRIC: The patient is alert and oriented x 3.  SKIN: No obvious rash, lesion, or ulcer.   DATA REVIEW:   CBC Recent Labs  Lab 02/23/18 0421  WBC 16.8*  HGB 8.3*  HCT 26.2*  PLT 104*    Chemistries  Recent Labs  Lab 02/23/18 0421  NA 137  K 3.7  CL 99  CO2 30  GLUCOSE 150*  BUN 34*  CREATININE 2.84*  CALCIUM 7.6*    Cardiac Enzymes No results for input(s): TROPONINI in the last 168 hours.  Microbiology Results  Results for orders placed or performed during the hospital  encounter of 02/14/18  Urine culture     Status: Abnormal   Collection Time: 02/14/18  6:44 PM  Result Value Ref Range Status   Specimen Description   Final    URINE, RANDOM Performed at [facility], [address] [place], [place] [address]    Special Requests   Final    NONE Performed at [facility], [address] [place], [place] [address]    Culture >=100,000 COLONIES/mL ESCHERICHIA COLI (A)  Final   Report Status 02/17/2018 FINAL  Final   Organism ID, Bacteria ESCHERICHIA COLI (A)  Final      Susceptibility   Escherichia coli - MIC*    AMPICILLIN <=2 SENSITIVE Sensitive     CEFAZOLIN <=4 SENSITIVE Sensitive     CEFTRIAXONE <=1 SENSITIVE Sensitive     CIPROFLOXACIN <=0.25 SENSITIVE Sensitive     GENTAMICIN <=1 SENSITIVE Sensitive     IMIPENEM <=0.25 SENSITIVE Sensitive     NITROFURANTOIN <=16 SENSITIVE Sensitive     TRIMETH/SULFA <=20 SENSITIVE Sensitive     AMPICILLIN/SULBACTAM <=2 SENSITIVE Sensitive     PIP/TAZO <=4 SENSITIVE Sensitive     Extended ESBL NEGATIVE Sensitive     * >=100,000 COLONIES/mL ESCHERICHIA COLI  MRSA  PCR Screening     Status: None   Collection Time: 02/15/18  5:13 PM  Result Value Ref Range Status   MRSA by PCR NEGATIVE NEGATIVE Final    Comment:        The GeneXpert MRSA Assay (FDA approved for NASAL specimens only), is one component of a comprehensive MRSA colonization surveillance program. It is not intended to diagnose MRSA infection nor to guide or monitor treatment for MRSA infections. Performed at [facility], [address] [place], [place] [address]   CULTURE, BLOOD (ROUTINE X 2) w Reflex to ID Panel     Status: None   Collection Time: 02/15/18  7:10 PM  Result Value Ref Range Status   Specimen Description BLOOD LEFT ANTECUBITAL  Final   Special Requests   Final    BOTTLES DRAWN AEROBIC AND ANAEROBIC Blood Culture adequate volume   Culture   Final    NO GROWTH 5 DAYS Performed at [facility], [address] [place], [place] [address]    Report Status 02/20/2018 FINAL  Final  CULTURE, BLOOD (ROUTINE X 2) w Reflex to ID Panel     Status: None   Collection Time: 02/15/18  7:25 PM  Result Value Ref Range Status   Specimen Description BLOOD BLOOD RIGHT HAND  Final   Special Requests   Final    BOTTLES DRAWN AEROBIC AND ANAEROBIC Blood Culture adequate volume   Culture   Final    NO GROWTH 5 DAYS Performed at [facility], [address], [place], [place] [address]    Report Status 02/20/2018 FINAL  Final  Urine Culture     Status: None   Collection Time: 02/15/18  8:10 PM  Result Value Ref Range Status   Specimen Description   Final    URINE, CATHETERIZED Performed at [facility], [address], [place], [place] [address]    Special Requests   Final    NONE Performed at [facility], [address], [place], [place] [address]    Culture   Final    NO GROWTH Performed at [facility], [place] [address], [place], [place] [address]    Report Status 02/17/2018 FINAL  Final  C difficile quick scan w PCR reflex     Status: None   Collection Time: 02/16/18  5:20 AM  Result Value Ref Range Status   C Diff antigen NEGATIVE NEGATIVE Final   C Diff toxin NEGATIVE NEGATIVE Final   C Diff interpretation No C. difficile detected.  Final    Comment: Performed at [facility], [address] [place],  [address]    RADIOLOGY:  No results found.  Follow up with PCP in 1 week.  Management plans discussed with the patient, family and they are in agreement.  CODE STATUS: Full code Code Status History    Date Active Date Inactive Code Status Order ID Comments User Context   02/14/2018 2203 02/25/2018 0004 Full Code [id number]  Harrie Foreman, MD Inpatient      TOTAL TIME TAKING CARE OF THIS PATIENT ON DAY OF DISCHARGE: more than 35 minutes.   Saundra Shelling M.D on 03/01/2018 at 3:38 PM  Between 7am to 6pm  - Pager - [phone]  After 6pm go to [email or web address] - password [email or web address] [facility]  Office  [phone]  CC: Primary care physician; Petra Kuba, MD  Note: This dictation was prepared with Dragon dictation along with smaller  Company secretary. Any transcriptional errors that result from this process are unintentional.

## 2018-03-02 ENCOUNTER — Emergency Department: Payer: 59

## 2018-03-02 ENCOUNTER — Inpatient Hospital Stay
Admission: EM | Admit: 2018-03-02 | Discharge: 2018-03-04 | DRG: 871 | Disposition: A | Discharge status: Home / Self Care | Payer: 59 | Attending: Internal Medicine | Admitting: Internal Medicine

## 2018-03-02 DIAGNOSIS — N179 Acute kidney failure, unspecified: Secondary | ICD-10-CM | POA: Diagnosis present

## 2018-03-02 DIAGNOSIS — I252 Old myocardial infarction: Secondary | ICD-10-CM | POA: Diagnosis not present

## 2018-03-02 DIAGNOSIS — J810 Acute pulmonary edema: Secondary | ICD-10-CM

## 2018-03-02 DIAGNOSIS — E1122 Type 2 diabetes mellitus with diabetic chronic kidney disease: Secondary | ICD-10-CM | POA: Diagnosis present

## 2018-03-02 DIAGNOSIS — Z7982 Long term (current) use of aspirin: Secondary | ICD-10-CM | POA: Diagnosis not present

## 2018-03-02 DIAGNOSIS — E8770 Fluid overload, unspecified: Secondary | ICD-10-CM | POA: Diagnosis present

## 2018-03-02 DIAGNOSIS — J189 Pneumonia, unspecified organism: Secondary | ICD-10-CM | POA: Diagnosis present

## 2018-03-02 DIAGNOSIS — I120 Hypertensive chronic kidney disease with stage 5 chronic kidney disease or end stage renal disease: Secondary | ICD-10-CM | POA: Diagnosis present

## 2018-03-02 DIAGNOSIS — N186 End stage renal disease: Secondary | ICD-10-CM | POA: Diagnosis present

## 2018-03-02 DIAGNOSIS — D631 Anemia in chronic kidney disease: Secondary | ICD-10-CM | POA: Diagnosis present

## 2018-03-02 DIAGNOSIS — D638 Anemia in other chronic diseases classified elsewhere: Secondary | ICD-10-CM

## 2018-03-02 DIAGNOSIS — I959 Hypotension, unspecified: Secondary | ICD-10-CM | POA: Diagnosis present

## 2018-03-02 DIAGNOSIS — E7800 Pure hypercholesterolemia, unspecified: Secondary | ICD-10-CM | POA: Diagnosis present

## 2018-03-02 DIAGNOSIS — Z79899 Other long term (current) drug therapy: Secondary | ICD-10-CM

## 2018-03-02 DIAGNOSIS — Z992 Dependence on renal dialysis: Secondary | ICD-10-CM

## 2018-03-02 DIAGNOSIS — Z794 Long term (current) use of insulin: Secondary | ICD-10-CM | POA: Diagnosis not present

## 2018-03-02 DIAGNOSIS — J9600 Acute respiratory failure, unspecified whether with hypoxia or hypercapnia: Secondary | ICD-10-CM

## 2018-03-02 DIAGNOSIS — N2581 Secondary hyperparathyroidism of renal origin: Secondary | ICD-10-CM | POA: Diagnosis present

## 2018-03-02 DIAGNOSIS — I10 Essential (primary) hypertension: Secondary | ICD-10-CM | POA: Diagnosis present

## 2018-03-02 DIAGNOSIS — J9601 Acute respiratory failure with hypoxia: Secondary | ICD-10-CM | POA: Diagnosis present

## 2018-03-02 DIAGNOSIS — A419 Sepsis, unspecified organism: Secondary | ICD-10-CM | POA: Diagnosis not present

## 2018-03-02 DIAGNOSIS — R0603 Acute respiratory distress: Secondary | ICD-10-CM

## 2018-03-02 DIAGNOSIS — I2510 Atherosclerotic heart disease of native coronary artery without angina pectoris: Secondary | ICD-10-CM | POA: Diagnosis present

## 2018-03-02 DIAGNOSIS — E119 Type 2 diabetes mellitus without complications: Secondary | ICD-10-CM

## 2018-03-02 DIAGNOSIS — E785 Hyperlipidemia, unspecified: Secondary | ICD-10-CM | POA: Diagnosis present

## 2018-03-02 HISTORY — DX: End stage renal disease: N18.6

## 2018-03-02 HISTORY — DX: End stage renal disease: Z99.2

## 2018-03-02 LAB — COMPREHENSIVE METABOLIC PANEL
ALT: 23 U/L (ref 0–44)
AST: 38 U/L (ref 15–41)
Albumin: 2.7 g/dL — ABNORMAL LOW (ref 3.5–5.0)
Alkaline Phosphatase: 104 U/L (ref 38–126)
Anion gap: 14 (ref 5–15)
BUN: 22 mg/dL (ref 8–23)
CO2: 18 mmol/L — AB (ref 22–32)
CREATININE: 2.28 mg/dL — AB (ref 0.44–1.00)
Calcium: 8 mg/dL — ABNORMAL LOW (ref 8.9–10.3)
Chloride: 102 mmol/L (ref 98–111)
GFR calc Af Amer: 26 mL/min — ABNORMAL LOW (ref >60)
GFR calc non Af Amer: 22 mL/min — ABNORMAL LOW (ref >60)
Glucose, Bld: 378 mg/dL — ABNORMAL HIGH (ref 70–99)
Potassium: 4.3 mmol/L (ref 3.5–5.1)
Sodium: 134 mmol/L — ABNORMAL LOW (ref 135–145)
TOTAL PROTEIN: 6.9 g/dL (ref 6.5–8.1)
Total Bilirubin: 0.5 mg/dL (ref 0.3–1.2)

## 2018-03-02 LAB — CREATININE, SERUM
Creatinine, Ser: 2.33 mg/dL — ABNORMAL HIGH (ref 0.44–1.00)
GFR calc non Af Amer: 22 mL/min — ABNORMAL LOW (ref >60)
GFR, EST AFRICAN AMERICAN: 25 mL/min — AB (ref >60)

## 2018-03-02 LAB — LACTIC ACID, PLASMA
LACTIC ACID, VENOUS: 1 mmol/L (ref 0.5–1.9)
Lactic Acid, Venous: 3.6 mmol/L (ref 0.5–1.9)

## 2018-03-02 LAB — CBC
HCT: 20.9 % — ABNORMAL LOW (ref 36.0–46.0)
HCT: 19 % — ABNORMAL LOW (ref 36.0–46.0)
HEMOGLOBIN: 6.4 g/dL — AB (ref 12.0–15.0)
Hemoglobin: 6 g/dL — ABNORMAL LOW (ref 12.0–15.0)
MCH: 26.8 pg (ref 26.0–34.0)
MCH: 27.3 pg (ref 26.0–34.0)
MCHC: 30.6 g/dL (ref 30.0–36.0)
MCHC: 31.6 g/dL (ref 30.0–36.0)
MCV: 87.4 fL (ref 80.0–100.0)
MCV: 86.4 fL (ref 80.0–100.0)
Platelets: 521 10*3/uL — ABNORMAL HIGH (ref 150–400)
Platelets: 422 10*3/uL — ABNORMAL HIGH (ref 150–400)
RBC: 2.39 MIL/uL — ABNORMAL LOW (ref 3.87–5.11)
RBC: 2.2 MIL/uL — ABNORMAL LOW (ref 3.87–5.11)
RDW: 18.4 % — AB (ref 11.5–15.5)
RDW: 18.5 % — ABNORMAL HIGH (ref 11.5–15.5)
WBC: 10.7 10*3/uL — ABNORMAL HIGH (ref 4.0–10.5)
WBC: 11.1 10*3/uL — ABNORMAL HIGH (ref 4.0–10.5)
nRBC: 0 % (ref 0.0–0.2)
nRBC: 0 % (ref 0.0–0.2)

## 2018-03-02 LAB — GLUCOSE, CAPILLARY: Glucose-Capillary: 355 mg/dL — ABNORMAL HIGH (ref 70–99)

## 2018-03-02 LAB — TROPONIN I: TROPONIN I: 0.04 ng/mL — AB (ref <0.03)

## 2018-03-02 LAB — PROCALCITONIN: Procalcitonin: 0.94 ng/mL

## 2018-03-02 MED ORDER — ONDANSETRON HCL 4 MG PO TABS: 4.0000 mg | ORAL_TABLET | Freq: Four times a day (QID) | ORAL | Status: DC | PRN

## 2018-03-02 MED ORDER — SODIUM CHLORIDE 0.9 % IV SOLN
2.0000 g | Freq: Once | INTRAVENOUS | Status: AC
  Administered 2018-03-02: 2 g via INTRAVENOUS
  Filled 2018-03-02: qty 2

## 2018-03-02 MED ORDER — CHLORHEXIDINE GLUCONATE CLOTH 2 % EX PADS
6.0000 | MEDICATED_PAD | Freq: Every day | CUTANEOUS | Status: DC
  Administered 2018-03-04: 6 via TOPICAL
  Filled 2018-03-02: qty 6

## 2018-03-02 MED ORDER — INSULIN DETEMIR 100 UNIT/ML ~~LOC~~ SOLN
10.0000 [IU] | Freq: Every day | SUBCUTANEOUS | Status: DC
  Administered 2018-03-02 – 2018-03-03 (×2): 10 [IU] via SUBCUTANEOUS
  Filled 2018-03-02 (×4): qty 0.1

## 2018-03-02 MED ORDER — NOREPINEPHRINE 4 MG/250ML-% IV SOLN
INTRAVENOUS | Status: AC
  Filled 2018-03-02: qty 250

## 2018-03-02 MED ORDER — NOREPINEPHRINE 4 MG/250ML-% IV SOLN
0.0000 ug/min | INTRAVENOUS | Status: DC
  Administered 2018-03-02: 5 ug/min via INTRAVENOUS
  Filled 2018-03-02: qty 250

## 2018-03-02 MED ORDER — ONDANSETRON HCL 4 MG/2ML IJ SOLN: 4.0000 mg | Freq: Four times a day (QID) | INTRAMUSCULAR | Status: DC | PRN

## 2018-03-02 MED ORDER — INSULIN ASPART 100 UNIT/ML ~~LOC~~ SOLN
0.0000 [IU] | SUBCUTANEOUS | Status: DC
  Administered 2018-03-02: 11 [IU] via SUBCUTANEOUS
  Administered 2018-03-03: 5 [IU] via SUBCUTANEOUS
  Administered 2018-03-03 (×2): 3 [IU] via SUBCUTANEOUS
  Administered 2018-03-04: 5 [IU] via SUBCUTANEOUS
  Filled 2018-03-02 (×5): qty 1

## 2018-03-02 MED ORDER — SODIUM CHLORIDE 0.9 % IV SOLN
1.0000 g | INTRAVENOUS | Status: DC
  Administered 2018-03-03: 1 g via INTRAVENOUS
  Filled 2018-03-02 (×3): qty 1

## 2018-03-02 MED ORDER — ACETAMINOPHEN 650 MG RE SUPP: 650.0000 mg | Freq: Four times a day (QID) | RECTAL | Status: DC | PRN

## 2018-03-02 MED ORDER — VANCOMYCIN HCL IN DEXTROSE 500-5 MG/100ML-% IV SOLN: 500.0000 mg | INTRAVENOUS | Status: DC

## 2018-03-02 MED ORDER — SODIUM CHLORIDE 0.9% IV SOLUTION: Freq: Once | INTRAVENOUS | Status: DC

## 2018-03-02 MED ORDER — VANCOMYCIN HCL IN DEXTROSE 1-5 GM/200ML-% IV SOLN
1000.0000 mg | Freq: Once | INTRAVENOUS | Status: AC
  Administered 2018-03-02: 1000 mg via INTRAVENOUS
  Filled 2018-03-02: qty 200

## 2018-03-02 MED ORDER — ACETAMINOPHEN 325 MG PO TABS: 650.0000 mg | ORAL_TABLET | Freq: Four times a day (QID) | ORAL | Status: DC | PRN

## 2018-03-02 MED ORDER — HEPARIN SODIUM (PORCINE) 5000 UNIT/ML IJ SOLN: 5000.0000 [IU] | Freq: Three times a day (TID) | INTRAMUSCULAR | Status: DC

## 2018-03-02 NOTE — Consult Note (Signed)
Name: Cathy Cline MRN: [id number] DOB: 1955-07-06    ADMISSION DATE:  03/02/2018 CONSULTATION DATE: 03/02/2018  REFERRING MD : Dr. Jannifer Franklin   CHIEF COMPLAINT: Shortness of Breath   BRIEF PATIENT DESCRIPTION:  62 yo female with CKD on hemodialysis admitted with acute respiratory failure secondary to pulmonary edema vs. pneumonia requiring Bipap, septic shock secondary to possible pneumonia, hemoptysis, and anemia   SIGNIFICANT EVENTS  12/12-Pt admitted to ICU on Bipap   HISTORY OF PRESENT ILLNESS:   This is a 62 yo female with a PMH of Tachycardia, Myocardial Infarction, HTN, Hypercholesteremia, CKD on Hemodialysis, Type II Diabetes Mellitus, and Carotid Stenosis. She presented to [facility] ER on 12/12 with intermittent cough with scant amount of blood and shortness of breath.  Her most recent complete HD session was today 12/12, however she only completed a partial session on 12/10.  Per ER notes she denied fever or flu like symptoms.  She was recently discharged from [facility] on 12/06 following treatment of acute on chronic renal failure requiring dialysis, acute cholecystitis, and acute pyelonephritis.  During current ER visit lab results revealed wbc 10.7, hgb 6.4, and platelets 521.  She does take tylenol occasionally but denies signs of bleeding, black tarry stools, NSAID or herbal remedy use.  CXR revealed cardiomegaly, moderate pulmonary edema, and small pleural effusions.  In the ER she was hypoxic with O2 sats in the 60's, she was initially placed on NRB without improvement requiring Bipap.  She was hypotensive and tachycardia concerning for sepsis secondary to possible pneumonia, she received iv abx and levophed gtt ordered.  ER physician contacted nephrology will plan for additional HD session.  She was subsequently admitted to ICU for additional workup and treatment.    PAST MEDICAL HISTORY :   has a past medical history of Carotid stenosis, Diabetes mellitus without complication  ([place]), ESRD on dialysis ([place]), Hypercholesterolemia, Hypertension, Myocardial infarction ([place]), and Tachycardia.  has a past surgical history that includes Cardiac catheterization; Tubal ligation; Knee surgery; Colonoscopy with propofol (N/A, 10/02/2015); and DIALYSIS/PERMA CATHETER INSERTION (N/A, 02/23/2018). Prior to Admission medications   Medication Sig Start Date End Date Taking? Authorizing Provider  amLODipine (NORVASC) 5 MG tablet Take 1 tablet (5 mg total) by mouth daily. 02/24/18 03/26/18  Saundra Shelling, MD  aspirin 81 MG tablet Take 81 mg by mouth daily.    [provider]  cetirizine (ZYRTEC) 10 MG tablet Take 10 mg by mouth at bedtime.    [provider]  docusate sodium (COLACE) 100 MG capsule Take 1 capsule (100 mg total) by mouth 2 (two) times daily. 02/24/18   Saundra Shelling, MD  insulin detemir (LEVEMIR) 100 UNIT/ML injection Inject 20-25 Units into the skin See admin instructions. Inject 25u under the skin every morning and inject 20u under the skin ever evening    [provider]  metoprolol tartrate (LOPRESSOR) 25 MG tablet Take 25 mg by mouth 2 (two) times daily.    [provider]  multivitamin (RENA-VIT) TABS tablet Take 1 tablet by mouth at bedtime. 02/24/18 03/26/18  Nicholes Mango, MD  Nutritional Supplements (FEEDING SUPPLEMENT, NEPRO CARB STEADY,) LIQD Take 237 mLs by mouth 2 (two) times daily between meals for 15 days. 02/24/18 03/11/18  Nicholes Mango, MD   No Known Allergies  FAMILY HISTORY:  family history is not on file. SOCIAL HISTORY:  reports that she has never smoked. She has never used smokeless tobacco. She reports that she does not drink alcohol or use drugs.  REVIEW OF SYSTEMS: Positives in BOLD  Constitutional: Negative for fever, chills, weight loss, malaise/fatigue and diaphoresis.  HENT: Negative for hearing loss, ear pain, nosebleeds, congestion, sore throat, neck pain, tinnitus and ear discharge.   Eyes: Negative for  blurred vision, double vision, photophobia, pain, discharge and redness.  Respiratory: intermittent cough with scant amount of blood, hemoptysis, sputum production, shortness of breath, wheezing and stridor.   Cardiovascular: Negative for chest pain, palpitations, orthopnea, claudication, leg swelling and PND.  Gastrointestinal: Negative for heartburn, nausea, vomiting, abdominal pain, diarrhea, constipation, blood in stool and melena.  Genitourinary: Negative for dysuria, urgency, frequency, hematuria and flank pain.  Musculoskeletal: Negative for myalgias, back pain, joint pain and falls.  Skin: Negative for itching and rash.  Neurological: Negative for dizziness, tingling, tremors, sensory change, speech change, focal weakness, seizures, loss of consciousness, weakness and headaches.  Endo/Heme/Allergies: Negative for environmental allergies and polydipsia. Does not bruise/bleed easily.  SUBJECTIVE:  No complaints at this time  VITAL SIGNS: Temp:  [98.1 F (36.7 C)] 98.1 F (36.7 C) (12/12 1959) Pulse Rate:  [111-126] 111 (12/12 2049) Resp:  [22-26] 24 (12/12 2049) BP: (80-124)/(66-79) 109/73 (12/12 2049) SpO2:  [68 %-100 %] 98 % (12/12 2049) Weight:  [53 kg-61 kg] 53 kg (12/12 2036)  PHYSICAL EXAMINATION: General: acutely ill appearing female, mildly tachypneic on Bipap  Neuro: alert and oriented, follows commands  HEENT: supple, no JVD  Cardiovascular: sinus tachycardia, no R/G, right IJ permcath dressing dry and intact  Lungs: crackles bilateral bases, slightly tachypneic Abdomen: +BS x4, soft, non tender, non distended  Musculoskeletal: 2+ bilateral lower extremt Skin: intact no rashes or lesions present   No results for input(s): NA, K, CL, CO2, BUN, CREATININE, GLUCOSE in the last 168 hours. Recent Labs  Lab 03/02/18 2007  HGB 6.4*  HCT 20.9*  WBC 10.7*  PLT 521*   Dg Chest Portable 1 View  Result Date: 03/02/2018 CLINICAL DATA:  Hemoptysis and shortness of  breath EXAM: PORTABLE CHEST 1 VIEW COMPARISON:  02/20/2018 FINDINGS: Left IJ approach central venous catheter has been removed in there is now a right IJ approach hemodialysis catheter. There is moderate interstitial pulmonary edema with small bilateral pleural effusions and basilar atelectasis. Moderate cardiomegaly. IMPRESSION: Cardiomegaly, small pleural effusions and moderate pulmonary edema. Electronically Signed   By: Ulyses Jarred M.D.   On: 03/02/2018 20:24    ASSESSMENT / PLAN:  Acute respiratory failure secondary to pulmonary edema vs. pneumonia  Prn Bipap for dyspnea and/or hypoxia  Prn bronchodilator therapy Repeat CXR in am   CKD newly started on outpatient hemodialysis   Nephrology consulted appreciate input-HD per recommendations plans for additional HD session 12/12 Trend BMP Replace electrolytes as indicated  Monitor UOP Avoid nephrotoxic medications   Hypotension secondary to sepsis Hx: Myocardial Infarction, HTN, Hypercholesteremia, Carotid Stenosis, and Tachycardia Continuous telemetry monitoring  Prn levophed gtt maintain map >65  Hold outpatient antihypertensives for now Continue outpatient aspirin   Sepsis secondary to possible pneumonia Trend WBC and monitor fever curve  Trend PCT and lactic acid Follow cultures  Continue iv cefepime and vancomycin, will discontinue vancomycin if MRSA PCR negative   Acute on chronic anemia  Trend CBC  VTE px: SCD's, no chemical prophylaxis  Monitor for s/sx of bleeding Will transfuse 1 unit pRBC's during HD session   Type II Diabetes Mellitus  CBG's q4hrs SSI and scheduled levemir   Marda Stalker, [place] Pager [phone] (please enter 7 digits) [place] Pager [phone] (  please enter 7 digits)

## 2018-03-02 NOTE — Progress Notes (Signed)
eLink Physician-Brief Progress Note Patient Name: Cathy Cline DOB: 07-29-1955 MRN: [id number]   Date of Service  03/02/2018  HPI/Events of Note  ESRD recently initiated on HD and is compliant, now with respiratory failure requiring NIV. CXR congestion vs pneumonia, hypotensive , anemic  eICU Interventions  Agree with empiric antibiotics. Transfuse during dialysis.     Intervention Category Major Interventions: Respiratory failure - evaluation and management Intermediate Interventions: Bleeding - evaluation and treatment with blood products;Hypervolemia - evaluation and management;Infection - evaluation and management Evaluation Type: New Patient Evaluation  Judd Lien 03/02/2018, 11:27 PM

## 2018-03-02 NOTE — Progress Notes (Signed)
Pt transported to ICU on Bipap without incident. Pt remains on Bipap and tol well at this time. Report given to ICU RT.

## 2018-03-02 NOTE — ED Notes (Signed)
Pt tolerating Bipap well, resting comfortably at this time, family remains at bedside. Call light within reach.

## 2018-03-02 NOTE — ED Provider Notes (Signed)
[facility] Emergency Department Provider Note       Time seen: ----------------------------------------- 8:40 PM on 03/02/2018 -----------------------------------------   I have reviewed the triage vital signs and the nursing notes.  HISTORY   Chief Complaint Shortness of Breath and Hemoptysis    HPI Cathy Cline is a 62 y.o. female with a history of carotid stenosis, diabetes, end-stage renal disease on dialysis, hyperlipidemia, hypertension, MI who presents to the ED for hemoptysis with shortness of breath.  Patient had dialysis today, reportedly dialysis went normally.  She goes to Tuesday, Thursday and Saturday dialysis.  She only had a partial treatment on Tuesday but she had a full treatment today.  She has not had any fever or flulike symptoms.  Past Medical History:  Diagnosis Date  . Carotid stenosis   . Diabetes mellitus without complication ([place])   . ESRD on dialysis ([place])   . Hypercholesterolemia   . Hypertension   . Myocardial infarction ([place])   . Tachycardia     Patient Active Problem List   Diagnosis Date Noted  . Cholecystitis, acute   . Elevated LFTs   . Acute renal failure ([place])   . Acute respiratory failure ([place])   . Sepsis ([place]) 02/15/2018  . Pyelonephritis 02/14/2018    Past Surgical History:  Procedure Laterality Date  . CARDIAC CATHETERIZATION    . COLONOSCOPY WITH PROPOFOL N/A 10/02/2015   Procedure: COLONOSCOPY WITH PROPOFOL;  Surgeon: Manya Silvas, MD;  Location: [facility] ENDOSCOPY;  Service: Endoscopy;  Laterality: N/A;  . DIALYSIS/PERMA CATHETER INSERTION N/A 02/23/2018   Procedure: DIALYSIS/PERMA CATHETER INSERTION;  Surgeon: Algernon Huxley, MD;  Location: [facility] CV LAB;  Service: Cardiovascular;  Laterality: N/A;  . KNEE SURGERY    . TUBAL LIGATION      Allergies Patient has no known allergies.  Social History Social History   Tobacco Use  . Smoking status: Never Smoker  . Smokeless  tobacco: Never Used  Substance Use Topics  . Alcohol use: No  . Drug use: No   Review of Systems Constitutional: Negative for fever. Cardiovascular: Negative for chest pain. Respiratory: Positive for shortness of breath and hemoptysis Gastrointestinal: Negative for abdominal pain, vomiting and diarrhea. Musculoskeletal: Negative for back pain.  Positive for edema Skin: Negative for rash. Neurological: Negative for headaches, positive for weakness  All systems negative/normal/unremarkable except as stated in the HPI  ____________________________________________   PHYSICAL EXAM:  VITAL SIGNS: ED Triage Vitals  Enc Vitals Group     BP 03/02/18 1959 (!) 80/66     Pulse Rate 03/02/18 1959 (!) 126     Resp 03/02/18 1959 (!) 26     Temp 03/02/18 1959 98.1 F (36.7 C)     Temp Source 03/02/18 1959 Oral     SpO2 03/02/18 1959 (!) 68 %     Weight 03/02/18 1953 134 lb 7.7 oz (61 kg)     Height --      Head Circumference --      Peak Flow --      Pain Score 03/02/18 1953 0     Pain Loc --      Pain Edu? --      Excl. in [place]? --    Constitutional: Alert and oriented.  Moderate distress, ill-appearing Eyes: Conjunctivae are normal. Normal extraocular movements. ENT   Head: Normocephalic and atraumatic.   Nose: No congestion/rhinnorhea.   Mouth/Throat: Mucous membranes are moist.   Neck: No stridor. Cardiovascular: Normal rate, regular  rhythm. No murmurs, rubs, or gallops. Respiratory: Tachypnea with rales bilaterally Gastrointestinal: Soft and nontender. Normal bowel sounds Musculoskeletal: Nontender with normal range of motion in extremities. No lower extremity tenderness nor edema. Neurologic:  Normal speech and language. No gross focal neurologic deficits are appreciated.  Skin:  Skin is warm, dry and intact. No rash noted. Psychiatric: Mood and affect are normal. Speech and behavior are normal.  ____________________________________________  EKG: Interpreted by  me.  Sinus tachycardia with rate of 126 bpm, LVH with repolarization abnormality, normal QT  ____________________________________________  ED COURSE:  As part of my medical decision making, I reviewed the following data within the [place] History obtained from family if available, nursing notes, old chart and ekg, as well as notes from prior ED visits. Patient presented for respiratory distress, we will assess with labs and imaging as indicated at this time.   Procedures ____________________________________________   LABS (pertinent positives/negatives)  Labs Reviewed  CBC - Abnormal; Notable for the following components:      Result Value   WBC 10.7 (*)    RBC 2.39 (*)    Hemoglobin 6.4 (*)    HCT 20.9 (*)    RDW 18.4 (*)    Platelets 521 (*)    All other components within normal limits  CULTURE, BLOOD (ROUTINE X 2)  CULTURE, BLOOD (ROUTINE X 2)  TROPONIN I  LACTIC ACID, PLASMA  LACTIC ACID, PLASMA  COMPREHENSIVE METABOLIC PANEL  URINALYSIS, COMPLETE (UACMP) WITH MICROSCOPIC   CRITICAL CARE Performed by: Laurence Aly   Total critical care time: 30 minutes  Critical care time was exclusive of separately billable procedures and treating other patients.  Critical care was necessary to treat or prevent imminent or life-threatening deterioration.  Critical care was time spent personally by me on the following activities: development of treatment plan with patient and/or surrogate as well as nursing, discussions with consultants, evaluation of patient's response to treatment, examination of patient, obtaining history from patient or surrogate, ordering and performing treatments and interventions, ordering and review of laboratory studies, ordering and review of radiographic studies, pulse oximetry and re-evaluation of patient's condition.  RADIOLOGY Images were viewed by me Chest x-ray IMPRESSION: Cardiomegaly, small pleural effusions and  moderate pulmonary edema. ____________________________________________  DIFFERENTIAL DIAGNOSIS   Flash pulmonary edema, hypertensive emergency, sepsis, pneumonia, ARDS  FINAL ASSESSMENT AND PLAN  Acute respiratory distress, end-stage renal disease on dialysis   Plan: The patient had presented for respiratory distress. Patient's labs revealed anemia worse than normal, she will require blood transfusion. Patient's imaging revealed diffuse edema, unclear why she would have such significant edema on the day she was dialyzed.  I did discuss with nephrology who will arrange for additional dialysis.  She is also received broad-spectrum antibiotics to cover for infection.  She remains in critical condition at this time on BiPAP.   Laurence Aly, MD   Note: This note was generated in part or whole with voice recognition software. Voice recognition is usually quite accurate but there are transcription errors that can and very often do occur. I apologize for any typographical errors that were not detected and corrected.     Earleen Newport, MD 03/02/18 2045

## 2018-03-02 NOTE — Progress Notes (Signed)
Pre HD Assesment    03/02/18 2250  Neurological  Level of Consciousness Alert  Orientation Level Oriented X4  Respiratory  Respiratory Pattern Labored  Chest Assessment Chest expansion symmetrical  Bilateral Breath Sounds Diminished  Cough Congested  Cardiac  Pulse Regular  Heart Sounds S1, S2  ECG Monitor Yes  Cardiac Rhythm ST  Vascular  R Radial Pulse +2  Edema Generalized  Psychosocial  Psychosocial (WDL) WDL

## 2018-03-02 NOTE — ED Notes (Signed)
Pt has a o2 saturation of 65% with good waveform on room air. Placed on NRB mask at 15 L. MD notified at at bedside. RT called to place pt on Bipap.

## 2018-03-02 NOTE — Progress Notes (Signed)
HD Tx started w/o complication   63/89/37 3428  Vital Signs  Temp 99.1 F (37.3 C)  Temp Source Axillary  Pulse Rate (!) 109  Pulse Rate Source Monitor  Resp (!) 25  BP 122/83  BP Location Left Arm  BP Method Automatic  Patient Position (if appropriate) Lying  Oxygen Therapy  SpO2 98 %  O2 Device Bi-PAP  Pulse Oximetry Type Continuous  Pain Assessment  Pain Scale 0-10  Pain Score 0  Dialysis Weight  Weight 54.3 kg  Type of Weight Pre-Dialysis  Time-Out for Hemodialysis  What Procedure? HD   Pt Identifiers(min of two) First/Last Name;MRN/Account#  Correct Site? Yes  Correct Side? Yes  Correct Procedure? Yes  Consents Verified? Yes  Rad Studies Available? N/A  Safety Precautions Reviewed? Yes  Engineer, civil (consulting) Number 5  Station Number  (Bedside ICU )  UF/Alarm Test Passed  Conductivity: Meter 14  Conductivity: Machine  13.9  pH 7.4  Reverse Osmosis wro 1  Normal Saline Lot Number [id number]  Dialyzer Lot Number [id number]  Disposable Set Lot Number [id number]  Machine Temperature 98.6 F (37 C)  Musician and Audible Yes  Pre Treatment Patient Checks  Vascular access used during treatment Catheter  Hepatitis B Surface Antigen Results Negative  Date Hepatitis B Surface Antigen Drawn 02/21/18  Hepatitis B Surface Antibody  (>10)  Date Hepatitis B Surface Antibody Drawn 02/21/18  Hemodialysis Consent Verified Yes  Hemodialysis Standing Orders Initiated Yes  ECG (Telemetry) Monitor On Yes  Prime Ordered Normal Saline  Length of  DialysisTreatment -hour(s) 3 Hour(s)  Dialysis Treatment Comments Na 140  Dialyzer Elisio 17H NR  Dialysate 3K, 2.5 Ca  Dialysis Anticoagulant None  Dialysate Flow Ordered 600  Blood Flow Rate Ordered 400 mL/min  Ultrafiltration Goal 3 Liters  Pre Treatment Labs Type & Cross;Type & Screen;CBC;Renal panel  Dialysis Blood Pressure Support Ordered Normal Saline  During Hemodialysis Assessment  Blood Flow Rate (mL/min) 400  mL/min  Arterial Pressure (mmHg) -170 mmHg  Venous Pressure (mmHg) 130 mmHg  Transmembrane Pressure (mmHg) 70 mmHg  Ultrafiltration Rate (mL/min) 1170 mL/min  Dialysate Flow Rate (mL/min) 600 ml/min  Conductivity: Machine  13.9  HD Safety Checks Performed Yes  Dialysis Fluid Bolus Normal Saline  Bolus Amount (mL) 250 mL  Intra-Hemodialysis Comments Tx initiated  Education / Care Plan  Dialysis Education Provided Yes  Documented Education in Care Plan Yes  Hemodialysis Catheter Left Internal jugular Triple-lumen  Placement Date/Time: 02/17/18 0630   Placed prior to admission: No  Time Out: Correct patient  Person Inserting Catheter: Dr. Soyla Murphy  Orientation: Left  Access Location: Internal jugular  Hemodialysis Catheter Type: Triple-lumen  Site Condition No complications  Blue Lumen Status Flushed  Red Lumen Status Flushed  Dressing Type Gauze/Drain sponge  Dressing Status Clean;Dry;Intact  Drainage Description None

## 2018-03-02 NOTE — ED Notes (Signed)
Pt brought to room from triage. Pt is spanish speaking, family at bedside, interpreter has been paged. Pt presents with labored breathing, bilateral LE edema, new dialysis pt x 1 week with porta cath to right chest. D/C'd from hospital Friday for similar s/s. Pt reports she felt fine yesterday, reports onset of SOB was after her dialysis tx today, reports no complications with dialysis, received full tx. Pt denies any fevers, denies CP, endorses productive cough with hemoptysis.

## 2018-03-02 NOTE — Progress Notes (Signed)
CODE SEPSIS - PHARMACY COMMUNICATION  **Broad Spectrum Antibiotics should be administered within 1 hour of Sepsis diagnosis**  Time Code Sepsis Called/Page Received: 20:16  Antibiotics Ordered: Vancomycin and Cefepime  Time of 1st antibiotic administration: Cefepime given at 20:41  Additional action taken by pharmacy:    If necessary, Name of Provider/Nurse Contacted:     Vira Blanco ,PharmD Clinical Pharmacist  03/02/2018  9:00 PM

## 2018-03-02 NOTE — ED Notes (Signed)
RT at bedside to place pt on Bipap.

## 2018-03-02 NOTE — ED Triage Notes (Signed)
Patient c/o hemoptysis, SOB. Patient is a dialysis treatment. Patient goes to dialysis T/TH/Sat. Patient had full treatment today, only partial treatment Tuesday.

## 2018-03-02 NOTE — ED Notes (Signed)
Pt transported via stretcher to ICU on BiPAP and cardiac monitoring with RN and RT.

## 2018-03-02 NOTE — H&P (Signed)
[place] [facility] [place] NAME: Cathy Cline    MR#:  [id number]  DATE OF BIRTH:  1955-06-28  DATE OF ADMISSION:  03/02/2018  PRIMARY CARE PHYSICIAN: Petra Kuba, MD   REQUESTING/REFERRING PHYSICIAN: Jimmye Norman, MD  CHIEF COMPLAINT:   Chief Complaint  Patient presents with  . Shortness of Breath  . Hemoptysis    HISTORY OF PRESENT ILLNESS:  Cathy Cline  is a 62 y.o. female who presents with chief complaint as above.  Patient presents after dialysis today with increasing shortness of breath.  Work-up here in the ED shows congestion and edema in her lungs.  Her blood pressure was also low.  She has an elevated white blood cell count.  It is unclear exactly what is going on with her, as a fluid overload or flash pulmonary edema picture would typically present with hypertension.  Patient does not know how much fluid was taken off during dialysis today, if any.  She required BiPAP here in the ED.  Hospitalist were called for admission, further evaluation and treatment  PAST MEDICAL HISTORY:   Past Medical History:  Diagnosis Date  . Carotid stenosis   . Diabetes mellitus without complication ([place])   . ESRD on dialysis ([place])   . Hypercholesterolemia   . Hypertension   . Myocardial infarction ([place])   . Tachycardia      PAST SURGICAL HISTORY:   Past Surgical History:  Procedure Laterality Date  . CARDIAC CATHETERIZATION    . COLONOSCOPY WITH PROPOFOL N/A 10/02/2015   Procedure: COLONOSCOPY WITH PROPOFOL;  Surgeon: Manya Silvas, MD;  Location[facility] ENDOSCOPY;  Service: Endoscopy;  Laterality: N/A;  . DIALYSIS/PERMA CATHETER INSERTION N/A 02/23/2018   Procedure: DIALYSIS/PERMA CATHETER INSERTION;  Surgeon: Algernon Huxley, MD;  Location: [facility] CV LAB;  Service: Cardiovascular;  Laterality: N/A;  . KNEE SURGERY    . TUBAL LIGATION       SOCIAL HISTORY:   Social History   Tobacco Use  . Smoking  status: Never Smoker  . Smokeless tobacco: Never Used  Substance Use Topics  . Alcohol use: No     FAMILY HISTORY:  Family history reviewed and is non-contributory   DRUG ALLERGIES:  No Known Allergies  MEDICATIONS AT HOME:   Prior to Admission medications   Medication Sig Start Date End Date Taking? Authorizing Provider  amLODipine (NORVASC) 5 MG tablet Take 1 tablet (5 mg total) by mouth daily. 02/24/18 03/26/18  Saundra Shelling, MD  aspirin 81 MG tablet Take 81 mg by mouth daily.    [provider]  cetirizine (ZYRTEC) 10 MG tablet Take 10 mg by mouth at bedtime.    [provider]  docusate sodium (COLACE) 100 MG capsule Take 1 capsule (100 mg total) by mouth 2 (two) times daily. 02/24/18   Saundra Shelling, MD  insulin detemir (LEVEMIR) 100 UNIT/ML injection Inject 20-25 Units into the skin See admin instructions. Inject 25u under the skin every morning and inject 20u under the skin ever evening    [provider]  metoprolol tartrate (LOPRESSOR) 25 MG tablet Take 25 mg by mouth 2 (two) times daily.    [provider]  multivitamin (RENA-VIT) TABS tablet Take 1 tablet by mouth at bedtime. 02/24/18 03/26/18  Nicholes Mango, MD  Nutritional Supplements (FEEDING SUPPLEMENT, NEPRO CARB STEADY,) LIQD Take 237 mLs by mouth 2 (two) times daily between meals for 15 days. 02/24/18 03/11/18  Nicholes Mango, MD  REVIEW OF SYSTEMS:  Review of Systems  Constitutional: Negative for chills, fever, malaise/fatigue and weight loss.  HENT: Negative for ear pain, hearing loss and tinnitus.   Eyes: Negative for blurred vision, double vision, pain and redness.  Respiratory: Positive for shortness of breath. Negative for cough and hemoptysis.   Cardiovascular: Negative for chest pain, palpitations, orthopnea and leg swelling.  Gastrointestinal: Negative for abdominal pain, constipation, diarrhea, nausea and vomiting.  Genitourinary: Negative for dysuria, frequency and  hematuria.  Musculoskeletal: Negative for back pain, joint pain and neck pain.  Skin:       No acne, rash, or lesions  Neurological: Negative for dizziness, tremors, focal weakness and weakness.  Endo/Heme/Allergies: Negative for polydipsia. Does not bruise/bleed easily.  Psychiatric/Behavioral: Negative for depression. The patient is not nervous/anxious and does not have insomnia.      VITAL SIGNS:   Vitals:   03/02/18 2015 03/02/18 2030 03/02/18 2036 03/02/18 2049  BP: 118/81 124/79  109/73  Pulse: (!) 117 (!) 112  (!) 111  Resp: (!) 33 (!) 22  (!) 24  Temp:      TempSrc:      SpO2: 100% 98%  98%  Weight:   53 kg    Wt Readings from Last 3 Encounters:  03/02/18 53 kg  02/24/18 60.4 kg  10/02/15 58.5 kg    PHYSICAL EXAMINATION:  Physical Exam  Vitals reviewed. Constitutional: She is oriented to person, place, and time. She appears well-developed and well-nourished. No distress.  HENT:  Head: Normocephalic and atraumatic.  Mouth/Throat: Oropharynx is clear and moist.  Eyes: Pupils are equal, round, and reactive to light. Conjunctivae and EOM are normal. No scleral icterus.  Neck: Normal range of motion. Neck supple. No JVD present. No thyromegaly present.  Cardiovascular: Normal rate, regular rhythm and intact distal pulses. Exam reveals no gallop and no friction rub.  No murmur heard. Respiratory: She is in respiratory distress. She has no wheezes. She has rales.  GI: Soft. Bowel sounds are normal. She exhibits no distension. There is no abdominal tenderness.  Musculoskeletal: Normal range of motion.        General: No edema.     Comments: No arthritis, no gout  Lymphadenopathy:    She has no cervical adenopathy.  Neurological: She is alert and oriented to person, place, and time. No cranial nerve deficit.  No dysarthria, no aphasia  Skin: Skin is warm and dry. No rash noted. No erythema.  Psychiatric: She has a normal mood and affect. Her behavior is normal. Judgment  and thought content normal.    LABORATORY PANEL:   CBC Recent Labs  Lab 03/02/18 2007  WBC 10.7*  HGB 6.4*  HCT 20.9*  PLT 521*   ------------------------------------------------------------------------------------------------------------------  Chemistries  No results for input(s): NA, K, CL, CO2, GLUCOSE, BUN, CREATININE, CALCIUM, MG, AST, ALT, ALKPHOS, BILITOT in the last 168 hours.  Invalid input(s): GFRCGP ------------------------------------------------------------------------------------------------------------------  Cardiac Enzymes Recent Labs  Lab 03/02/18 2007  TROPONINI 0.04*   ------------------------------------------------------------------------------------------------------------------  RADIOLOGY:  Dg Chest Portable 1 View  Result Date: 03/02/2018 CLINICAL DATA:  Hemoptysis and shortness of breath EXAM: PORTABLE CHEST 1 VIEW COMPARISON:  02/20/2018 FINDINGS: Left IJ approach central venous catheter has been removed in there is now a right IJ approach hemodialysis catheter. There is moderate interstitial pulmonary edema with small bilateral pleural effusions and basilar atelectasis. Moderate cardiomegaly. IMPRESSION: Cardiomegaly, small pleural effusions and moderate pulmonary edema. Electronically Signed   By: Ulyses Jarred M.D.   On:  03/02/2018 20:24    EKG:   Orders placed or performed during the hospital encounter of 03/02/18  . EKG 12-Lead  . EKG 12-Lead  . ED EKG within 10 minutes  . ED EKG within 10 minutes  . ED EKG 12-Lead  . ED EKG 12-Lead    IMPRESSION AND PLAN:  Principal Problem:   Acute respiratory failure with hypoxia (HCC) -currently on BiPAP with improved oxygenation.  This is secondary to fluid overload, though there is strong suspicion that there may be some infection underlying the fluid in her lungs.  See below for treatment Active Problems:   Sepsis ([place]) -IV antibiotics given, lactic acid was initially elevated, second check  was within normal limits.  Blood pressure was initially low, but then improved to low-end normotensive.  Cultures sent.  Suspicion for underlying pneumonia   ESRD on dialysis [facility]) -nephrology consult for dialysis   Diabetes ([place]) -sliding scale insulin coverage   HTN (hypertension) -hold antihypertensives for now   HLD (hyperlipidemia) -Home dose antilipid  Chart review performed and case discussed with ED provider. Labs, imaging and/or ECG reviewed by provider and discussed with patient/family. Management plans discussed with the patient and/or family.  DVT PROPHYLAXIS: SubQ heparin  GI PROPHYLAXIS:  None  ADMISSION STATUS: Inpatient     CODE STATUS: Full Code Status History    Date Active Date Inactive Code Status Order ID Comments User Context   02/14/2018 2203 02/25/2018 0004 Full Code [id number]  Harrie Foreman, MD Inpatient      TOTAL CRITICAL CARE TIME TAKING CARE OF THIS PATIENT: 50 minutes.   Jannifer Franklin, Raynie Steinhaus FIELDING 03/02/2018, 9:18 PM  [facility]  Office  [phone]  CC: Primary care physician; Petra Kuba, MD  Note:  This document was prepared using Dragon voice recognition software and may include unintentional dictation errors.

## 2018-03-02 NOTE — Progress Notes (Signed)
Pharmacy Antibiotic Note  Cathy Cline is a 63 y.o. female admitted on 03/02/2018 with sepsis.  Pharmacy has been consulted for Vancomycin and Cefepime dosing. Patient is ESRD on HD on a TTS schedule. Received Vancomycin 1g IV and Cefepime 1g IV in the ED.  Plan: Cefepime 1g IV q24h Vancomycin 500mg  IV after each HD session. Vancomycin trough prior to 3rd HD session.  Weight: 116 lb 13.5 oz (53 kg)  Temp (24hrs), Avg:98.1 F (36.7 C), Min:98.1 F (36.7 C), Max:98.1 F (36.7 C)  Recent Labs  Lab 03/02/18 2007 03/02/18 2034  WBC 10.7*  --   LATICACIDVEN  --  3.6*    Estimated Creatinine Clearance: 15.5 mL/min (A) (by C-G formula based on SCr of 2.84 mg/dL (H)).    No Known Allergies  Antimicrobials this admission: Vancomycin 12/10 >>  Cefepime 12/10 >>   Thank you for allowing pharmacy to be a part of this patient's care.  Paulina Fusi, PharmD, BCPS 03/02/2018 9:43 PM

## 2018-03-03 ENCOUNTER — Inpatient Hospital Stay: Payer: 59

## 2018-03-03 DIAGNOSIS — Z992 Dependence on renal dialysis: Secondary | ICD-10-CM

## 2018-03-03 DIAGNOSIS — D638 Anemia in other chronic diseases classified elsewhere: Secondary | ICD-10-CM

## 2018-03-03 DIAGNOSIS — N186 End stage renal disease: Secondary | ICD-10-CM

## 2018-03-03 LAB — CBC
HEMATOCRIT: 22.9 % — AB (ref 36.0–46.0)
Hemoglobin: 7.4 g/dL — ABNORMAL LOW (ref 12.0–15.0)
MCH: 27.9 pg (ref 26.0–34.0)
MCHC: 32.3 g/dL (ref 30.0–36.0)
MCV: 86.4 fL (ref 80.0–100.0)
Platelets: 359 10*3/uL (ref 150–400)
RBC: 2.65 MIL/uL — ABNORMAL LOW (ref 3.87–5.11)
RDW: 16.9 % — ABNORMAL HIGH (ref 11.5–15.5)
WBC: 10 10*3/uL (ref 4.0–10.5)
nRBC: 0 % (ref 0.0–0.2)

## 2018-03-03 LAB — IRON AND TIBC
IRON: 83 ug/dL (ref 28–170)
Saturation Ratios: 36 % — ABNORMAL HIGH (ref 10.4–31.8)
TIBC: 232 ug/dL — ABNORMAL LOW (ref 250–450)
UIBC: 149 ug/dL

## 2018-03-03 LAB — MRSA PCR SCREENING: MRSA by PCR: NEGATIVE

## 2018-03-03 LAB — BASIC METABOLIC PANEL
ANION GAP: 8 (ref 5–15)
BUN: 16 mg/dL (ref 8–23)
CO2: 27 mmol/L (ref 22–32)
Calcium: 8 mg/dL — ABNORMAL LOW (ref 8.9–10.3)
Chloride: 102 mmol/L (ref 98–111)
Creatinine, Ser: 1.72 mg/dL — ABNORMAL HIGH (ref 0.44–1.00)
GFR calc Af Amer: 37 mL/min — ABNORMAL LOW (ref >60)
GFR calc non Af Amer: 32 mL/min — ABNORMAL LOW (ref >60)
Glucose, Bld: 114 mg/dL — ABNORMAL HIGH (ref 70–99)
Potassium: 3.7 mmol/L (ref 3.5–5.1)
Sodium: 137 mmol/L (ref 135–145)

## 2018-03-03 LAB — GLUCOSE, CAPILLARY
GLUCOSE-CAPILLARY: 102 mg/dL — AB (ref 70–99)
Glucose-Capillary: 109 mg/dL — ABNORMAL HIGH (ref 70–99)
Glucose-Capillary: 198 mg/dL — ABNORMAL HIGH (ref 70–99)
Glucose-Capillary: 213 mg/dL — ABNORMAL HIGH (ref 70–99)
Glucose-Capillary: 209 mg/dL — ABNORMAL HIGH (ref 70–99)
Glucose-Capillary: 191 mg/dL — ABNORMAL HIGH (ref 70–99)

## 2018-03-03 LAB — PROCALCITONIN: Procalcitonin: 3.24 ng/mL

## 2018-03-03 LAB — FERRITIN: Ferritin: 369 ng/mL — ABNORMAL HIGH (ref 11–307)

## 2018-03-03 LAB — TROPONIN I
Troponin I: 0.21 ng/mL (ref <0.03)
Troponin I: 0.17 ng/mL (ref <0.03)

## 2018-03-03 LAB — PREPARE RBC (CROSSMATCH)

## 2018-03-03 MED ORDER — METOPROLOL TARTRATE 25 MG PO TABS
25.0000 mg | ORAL_TABLET | Freq: Two times a day (BID) | ORAL | Status: DC
  Administered 2018-03-03 (×2): 25 mg via ORAL
  Filled 2018-03-03 (×2): qty 1

## 2018-03-03 MED ORDER — AMLODIPINE BESYLATE 5 MG PO TABS: 5.0000 mg | ORAL_TABLET | Freq: Every day | ORAL | Status: DC

## 2018-03-03 MED ORDER — PANTOPRAZOLE SODIUM 40 MG IV SOLR
40.0000 mg | Freq: Two times a day (BID) | INTRAVENOUS | Status: DC
  Administered 2018-03-03: 40 mg via INTRAVENOUS
  Filled 2018-03-03: qty 40

## 2018-03-03 MED ORDER — PANTOPRAZOLE SODIUM 40 MG PO TBEC
40.0000 mg | DELAYED_RELEASE_TABLET | Freq: Two times a day (BID) | ORAL | Status: DC
  Administered 2018-03-03 – 2018-03-04 (×2): 40 mg via ORAL
  Filled 2018-03-03 (×2): qty 1

## 2018-03-03 MED ORDER — METOPROLOL TARTRATE 25 MG PO TABS: 25.0000 mg | ORAL_TABLET | Freq: Two times a day (BID) | ORAL | Status: DC

## 2018-03-03 MED ORDER — INSULIN DETEMIR 100 UNIT/ML ~~LOC~~ SOLN: 20.0000 [IU] | SUBCUTANEOUS | Status: DC

## 2018-03-03 MED ORDER — FUROSEMIDE 10 MG/ML IJ SOLN
80.0000 mg | Freq: Once | INTRAMUSCULAR | Status: AC
  Administered 2018-03-03: 80 mg via INTRAVENOUS
  Filled 2018-03-03: qty 8

## 2018-03-03 MED ORDER — AMLODIPINE BESYLATE 10 MG PO TABS
10.0000 mg | ORAL_TABLET | Freq: Every day | ORAL | Status: DC
  Administered 2018-03-03 – 2018-03-04 (×2): 10 mg via ORAL
  Filled 2018-03-03 (×2): qty 1

## 2018-03-03 NOTE — Progress Notes (Signed)
HD TX Completed, tolerated well, uf goal met, blood admin during tx 1 unit.    03/03/18 0200  Vital Signs  Pulse Rate 91  Pulse Rate Source Monitor  Resp 17  BP (!) 150/66 (B/P stabilizing )  BP Location Right Wrist  BP Method Automatic  Patient Position (if appropriate) Lying  Oxygen Therapy  SpO2 100 %  O2 Device Bi-PAP  Pulse Oximetry Type Continuous  During Hemodialysis Assessment  Blood Flow Rate (mL/min) 400 mL/min  Arterial Pressure (mmHg) -180 mmHg  Venous Pressure (mmHg) 130 mmHg  Transmembrane Pressure (mmHg) 70 mmHg  Ultrafiltration Rate (mL/min) 1170 mL/min  Dialysate Flow Rate (mL/min) 600 ml/min  Conductivity: Machine  13.9  HD Safety Checks Performed Yes  Intra-Hemodialysis Comments Tx completed;Tolerated well (Pt stable )

## 2018-03-03 NOTE — Progress Notes (Signed)
Pharmacy Antibiotic Note  Cathy Cline is a 62 y.o. female admitted on 03/02/2018 with sepsis.  Pharmacy was consulted for Vancomycin and Cefepime dosing. Patient is ESRD on HD on a TTS schedule. Received Vancomycin 1g IV and Cefepime 1g IV in the ED.  MRSA PCR (-)- Vancomycin discontinued 12/12  Plan: Continue Cefepime 1g IV q24h   Height: 4\' 11"  (149.9 cm) Weight: 114 lb 13.8 oz (52.1 kg) IBW/kg (Calculated) : 43.2  Temp (24hrs), Avg:98.7 F (37.1 C), Min:98.1 F (36.7 C), Max:99.3 F (37.4 C)  Recent Labs  Lab 03/02/18 2007 03/02/18 2034 03/02/18 2309 03/03/18 0629  WBC 10.7*  --  11.1* 10.0  CREATININE 2.28*  --  2.33* 1.72*  LATICACIDVEN  --  3.6* 1.0  --     Estimated Creatinine Clearance: 25.4 mL/min (A) (by C-G formula based on SCr of 1.72 mg/dL (H)).    No Known Allergies  Antimicrobials this admission: Vancomycin 12/10 >> 12/12 Cefepime 12/10 >>   Thank you for allowing pharmacy to be a part of this patient's care.  Pernell Dupre, PharmD, BCPS Clinical Pharmacist 03/03/2018 12:08 PM

## 2018-03-03 NOTE — Progress Notes (Signed)
[place] at [place] NAME: Cathy Cline    MR#:  [id number]  DATE OF BIRTH:  February 23, 1956  SUBJECTIVE:   Patient was on BiPAP and now on nasal cannula shortness of breath improving.  Patient had dialysis last night and yesterday as per normal routine   REVIEW OF SYSTEMS:    Review of Systems  Constitutional: Negative for fever, chills weight loss HENT: Negative for ear pain, nosebleeds, congestion, facial swelling, rhinorrhea, neck pain, neck stiffness and ear discharge.   Respiratory: Negative for cough, shortness of breath(IMPROVED), wheezing  Cardiovascular: Negative for chest pain, palpitations and leg swelling.  Gastrointestinal: Negative for heartburn, abdominal pain, vomiting, diarrhea or consitpation Genitourinary: Negative for dysuria, urgency, frequency, hematuria Musculoskeletal: Negative for back pain or joint pain Neurological: Negative for dizziness, seizures, syncope, focal weakness,  numbness and headaches.  Hematological: Does not bruise/bleed easily.  Psychiatric/Behavioral: Negative for hallucinations, confusion, dysphoric mood    Tolerating Diet: yes      DRUG ALLERGIES:  No Known Allergies  VITALS:  Blood pressure (!) 165/57, pulse (!) 101, temperature 98.6 F (37 C), temperature source Oral, resp. rate (!) 24, height 4\' 11"  (1.499 m), weight 52.1 kg, SpO2 93 %.  PHYSICAL EXAMINATION:  Constitutional: Appears well-developed and well-nourished. No distress. HENT: Normocephalic. Marland Kitchen Oropharynx is clear and moist.  Eyes: Conjunctivae and EOM are normal. PERRLA, no scleral icterus.  Neck: Normal ROM. Neck supple. No JVD. No tracheal deviation. CVS: RRR, S1/S2 +, no murmurs, no gallops, no carotid bruit.  Pulmonary: Effort and breath sounds normal, no stridor, rhonchi, wheezes, rales.  Abdominal: Soft. BS +,  no distension, tenderness, rebound or guarding.  Musculoskeletal: Normal range of motion. No edema  and no tenderness.  Neuro: Alert. CN 2-12 grossly intact. No focal deficits. Skin: Skin is warm and dry. No rash noted. Psychiatric: Normal mood and affect.      LABORATORY PANEL:   CBC Recent Labs  Lab 03/03/18 0629  WBC 10.0  HGB 7.4*  HCT 22.9*  PLT 359   ------------------------------------------------------------------------------------------------------------------  Chemistries  Recent Labs  Lab 03/02/18 2007  03/03/18 0629  NA 134*  --  137  K 4.3  --  3.7  CL 102  --  102  CO2 18*  --  27  GLUCOSE 378*  --  114*  BUN 22  --  16  CREATININE 2.28*   < > 1.72*  CALCIUM 8.0*  --  8.0*  AST 38  --   --   ALT 23  --   --   ALKPHOS 104  --   --   BILITOT 0.5  --   --    < > = values in this interval not displayed.   ------------------------------------------------------------------------------------------------------------------  Cardiac Enzymes Recent Labs  Lab 03/02/18 2007 03/03/18 0629  TROPONINI 0.04* 0.21*   ------------------------------------------------------------------------------------------------------------------  RADIOLOGY:  Dg Chest Port 1 View  Result Date: 03/03/2018 CLINICAL DATA:  Acute respiratory failure EXAM: PORTABLE CHEST 1 VIEW COMPARISON:  03/02/2018 FINDINGS: Cardiomegaly. Diffuse bilateral airspace opacities compatible with edema/CHF. Layering bilateral effusions. No real change since prior study. Right dialysis catheter remains in place, unchanged. IMPRESSION: Continued pulmonary edema/CHF with layering effusions. No real change. Electronically Signed   By: Rolm Baptise M.D.   On: 03/03/2018 10:04   Dg Chest Portable 1 View  Result Date: 03/02/2018 CLINICAL DATA:  Hemoptysis and shortness of breath EXAM: PORTABLE CHEST 1 VIEW COMPARISON:  02/20/2018 FINDINGS: Left IJ approach  central venous catheter has been removed in there is now a right IJ approach hemodialysis catheter. There is moderate interstitial pulmonary edema with  small bilateral pleural effusions and basilar atelectasis. Moderate cardiomegaly. IMPRESSION: Cardiomegaly, small pleural effusions and moderate pulmonary edema. Electronically Signed   By: Ulyses Jarred M.D.   On: 03/02/2018 20:24     ASSESSMENT AND PLAN:   62 year old female with history of end-stage renal disease on hemodialysis and diabetes who presented with shortness of breath.  1.  Acute hypoxic respiratory failure in the setting of pulmonary edema from end-stage renal disease: Patient off of BiPAP and being weaned off nasal cannula  2.  Pulmonary edema due to end-stage renal disease on hemodialysis: Patient will undergo dialysis today Continue IV Lasix  3.  Sepsis: Patient presented with tachypnea, hypotension and tachycardia Sepsis thought to be due to underlying pneumonia Continue cefepime Follow-up cultures 4.  Diabetes: Continue sliding scale  5.  Essential hypertension: Continue Norvasc and metoprolol   6.  Anemia acute on chronic kidney disease: [facility] GI evaluation D/w kolloru and intensivist  Management plans discussed with the patient and she is in agreement.  CODE STATUS: full  TOTAL TIME TAKING CARE OF THIS PATIENT: 30 minutes.     POSSIBLE D/C tomorrow, DEPENDING ON CLINICAL CONDITION.   Hutton Pellicane M.D on 03/03/2018 at 10:51 AM  Between 7am to 6pm - Pager - [phone] After 6pm go to [email or web address] - password [email or web address] [facility]  Office  [phone]  CC: Primary care physician; Petra Kuba, MD  Note: This dictation was prepared with Dragon dictation along with smaller phrase technology. Any transcriptional errors that result from this process are unintentional.

## 2018-03-03 NOTE — Progress Notes (Signed)
Post HD Assessment    03/03/18 0223  Neurological  Level of Consciousness Alert  Orientation Level Oriented X4  Respiratory  Respiratory Pattern Regular;Unlabored  Chest Assessment Chest expansion symmetrical  Bilateral Breath Sounds Diminished  Cough None  Cardiac  Pulse Regular  Heart Sounds S1, S2  ECG Monitor Yes  Cardiac Rhythm ST  Vascular  R Radial Pulse +2  Edema Generalized  Psychosocial  Psychosocial (WDL) WDL

## 2018-03-03 NOTE — Progress Notes (Signed)
Post HD Tx    03/03/18 0215  Hand-Off documentation  Report given to (Full Name) Georg Ruddle, RN   Report received from (Full Name) Beatris Ship , RN   Vital Signs  Temp 98.1 F (36.7 C)  Temp Source Axillary  Pulse Rate 95  Pulse Rate Source Monitor  Resp (!) 21  BP 138/64  BP Location Right Wrist  BP Method Automatic  Patient Position (if appropriate) Lying  Oxygen Therapy  SpO2 100 %  O2 Device Bi-PAP  Pulse Oximetry Type Continuous  Pain Assessment  Pain Scale 0-10  Pain Score 0  Dialysis Weight  Weight 52.1 kg  Type of Weight Post-Dialysis  Post-Hemodialysis Assessment  Rinseback Volume (mL) 250 mL  KECN 40.7 V  Dialyzer Clearance Lightly streaked  Duration of HD Treatment -hour(s) 3 hour(s)  Hemodialysis Intake (mL) 500 mL  UF Total -Machine (mL) 3502 mL  Net UF (mL) 3002 mL  Tolerated HD Treatment Yes  Hemodialysis Catheter Left Internal jugular Triple-lumen  Placement Date/Time: 02/17/18 0630   Placed prior to admission: No  Time Out: Correct patient  Person Inserting Catheter: Dr. Soyla Murphy  Orientation: Left  Access Location: Internal jugular  Hemodialysis Catheter Type: Triple-lumen  Catheter fill solution Heparin 1000 units/ml  Catheter fill volume (Arterial) 1.5 cc  Catheter fill volume (Venous) 1.5  Post treatment catheter status Capped and Clamped

## 2018-03-03 NOTE — Consult Note (Signed)
Lucilla Lame, MD [facility]  [address] [place], [place] [address] Phone: [phone] Fax : [phone]  Consultation  Referring Provider:     Dr. Benjie Karvonen Primary Care Physician:  Petra Kuba, MD Primary Gastroenterologist:  Dr. Vira Agar         Reason for Consultation:     Anemia  Date of Admission:  03/02/2018 Date of Consultation:  03/03/2018         HPI:   Cathy Cline is a 62 y.o. female who was admitted with a history of end-stage renal disease on hemodialysis and was admitted with a symptom of shortness of breath.  The patient was diagnosed with acute hypoxic respiratory failure with pulmonary edema.  The patient does not speak English but denies any abdominal pain at the present time.  Component     Latest Ref Rng & Units 02/17/2018 02/18/2018 02/19/2018 02/20/2018             Hemoglobin     12.0 - 15.0 g/dL 7.6 (L) 8.9 (L) 9.1 (L) 9.4 (L)  HCT     36.0 - 46.0 % 22.7 (L) 26.8 (L) 27.6 (L) 29.1 (L)   Component     Latest Ref Rng & Units 02/22/2018 02/23/2018 03/02/2018 03/02/2018          8:07 PM 11:09 PM  Hemoglobin     12.0 - 15.0 g/dL 8.0 (L) 8.3 (L) 6.4 (L) 6.0 (L)  HCT     36.0 - 46.0 % 25.3 (L) 26.2 (L) 20.9 (L) 19.0 (L)   Component     Latest Ref Rng & Units 03/03/2018          Hemoglobin     12.0 - 15.0 g/dL 7.4 (L)  HCT     36.0 - 46.0 % 22.9 (L)    The patient's hemoglobin has been as low as 7.6 on 1129 of this year with a repeat of 6.4 on admission this time.  The nursing staff reports that the patient has not had any sign of any GI bleeding.  There is been no black stools or bloody stools.  The patient had a colonoscopy on October 02, 2015 by Dr. Vira Agar and at that time the patient was found to have 3 small polyps that were removed.  Patient also has had a consistently normal MCV.  Past Medical History:  Diagnosis Date  . Carotid stenosis   . Diabetes mellitus without complication ([place])   . ESRD on dialysis ([place])   .  Hypercholesterolemia   . Hypertension   . Myocardial infarction ([place])   . Tachycardia     Past Surgical History:  Procedure Laterality Date  . CARDIAC CATHETERIZATION    . COLONOSCOPY WITH PROPOFOL N/A 10/02/2015   Procedure: COLONOSCOPY WITH PROPOFOL;  Surgeon: Manya Silvas, MD;  Location: [facility] ENDOSCOPY;  Service: Endoscopy;  Laterality: N/A;  . DIALYSIS/PERMA CATHETER INSERTION N/A 02/23/2018   Procedure: DIALYSIS/PERMA CATHETER INSERTION;  Surgeon: Algernon Huxley, MD;  Location: [facility];  Service: Cardiovascular;  Laterality: N/A;  . KNEE SURGERY    . TUBAL LIGATION      Prior to Admission medications   Medication Sig Start Date End Date Taking? Authorizing Provider  amLODipine (NORVASC) 5 MG tablet Take 1 tablet (5 mg total) by mouth daily. 02/24/18 03/26/18 Yes Pyreddy, Reatha Harps, MD  aspirin 81 MG tablet Take 81 mg by mouth daily.   Yes [provider]  cetirizine (ZYRTEC) 10 MG tablet  Take 10 mg by mouth at bedtime.   Yes [provider]  docusate sodium (COLACE) 100 MG capsule Take 1 capsule (100 mg total) by mouth 2 (two) times daily. 02/24/18  Yes Pyreddy, Reatha Harps, MD  insulin detemir (LEVEMIR) 100 UNIT/ML injection Inject 20-25 Units into the skin See admin instructions. Inject 25u under the skin every morning and inject 20u under the skin ever evening   Yes [provider]  metoprolol tartrate (LOPRESSOR) 25 MG tablet Take 25 mg by mouth 2 (two) times daily.   Yes [provider]  multivitamin (RENA-VIT) TABS tablet Take 1 tablet by mouth at bedtime. 02/24/18 03/26/18 Yes Gouru, Illene Silver, MD  Nutritional Supplements (FEEDING SUPPLEMENT, NEPRO CARB STEADY,) LIQD Take 237 mLs by mouth 2 (two) times daily between meals for 15 days. 02/24/18 03/11/18 Yes Gouru, Illene Silver, MD    No family history on file.   Social History   Tobacco Use  . Smoking status: Never Smoker  . Smokeless tobacco: Never Used  Substance Use Topics  . Alcohol use: No  .  Drug use: No    Allergies as of 03/02/2018  . (No Known Allergies)    Review of Systems:    All systems reviewed and negative except where noted in HPI.   Physical Exam:  Vital signs in last 24 hours: Temp:  [98.1 F (36.7 C)-99.3 F (37.4 C)] 98.6 F (37 C) (12/13 0830) Pulse Rate:  [86-126] 90 (12/13 1500) Resp:  [14-33] 15 (12/13 1500) BP: (57-193)/(40-91) 158/51 (12/13 1500) SpO2:  [68 %-100 %] 99 % (12/13 1500) FiO2 (%):  [40 %] 40 % (12/13 0400) Weight:  [52.1 kg-61 kg] 52.1 kg (12/13 0215)   General:   Pleasant, cooperative in NAD Head:  Normocephalic and atraumatic. Eyes:   No icterus.   Conjunctiva pink. PERRLA. Ears:  Normal auditory acuity. Neck:  Supple; no masses or thyroidomegaly Lungs: Respirations even and unlabored. Lungs clear to auscultation bilaterally.   No wheezes, crackles, or rhonchi.  Heart:  Regular rate and rhythm;  Without murmur, clicks, rubs or gallops Abdomen:  Soft, nondistended, nontender. Normal bowel sounds. No appreciable masses or hepatomegaly.  No rebound or guarding.  Rectal:  Not performed. Msk:  Symmetrical without gross deformities.    Extremities:  Without edema, cyanosis or clubbing. Neurologic:  Alert and oriented x3;  grossly normal neurologically. Skin:  Intact without significant lesions or rashes. Cervical Nodes:  No significant cervical adenopathy. Psych:  Alert and cooperative. Normal affect.  LAB RESULTS: Recent Labs    03/02/18 2007 03/02/18 2309 03/03/18 0629  WBC 10.7* 11.1* 10.0  HGB 6.4* 6.0* 7.4*  HCT 20.9* 19.0* 22.9*  PLT 521* 422* 359   BMET Recent Labs    03/02/18 2007 03/02/18 2309 03/03/18 0629  NA 134*  --  137  K 4.3  --  3.7  CL 102  --  102  CO2 18*  --  27  GLUCOSE 378*  --  114*  BUN 22  --  16  CREATININE 2.28* 2.33* 1.72*  CALCIUM 8.0*  --  8.0*   LFT Recent Labs    03/02/18 2007  PROT 6.9  ALBUMIN 2.7*  AST 38  ALT 23  ALKPHOS 104  BILITOT 0.5   PT/INR No results for  input(s): LABPROT, INR in the last 72 hours.  STUDIES: Dg Chest Port 1 View  Result Date: 03/03/2018 CLINICAL DATA:  Acute respiratory failure EXAM: PORTABLE CHEST 1 VIEW COMPARISON:  03/02/2018 FINDINGS: Cardiomegaly. Diffuse  bilateral airspace opacities compatible with edema/CHF. Layering bilateral effusions. No real change since prior study. Right dialysis catheter remains in place, unchanged. IMPRESSION: Continued pulmonary edema/CHF with layering effusions. No real change. Electronically Signed   By: Rolm Baptise M.D.   On: 03/03/2018 10:04   Dg Chest Portable 1 View  Result Date: 03/02/2018 CLINICAL DATA:  Hemoptysis and shortness of breath EXAM: PORTABLE CHEST 1 VIEW COMPARISON:  02/20/2018 FINDINGS: Left IJ approach central venous catheter has been removed in there is now a right IJ approach hemodialysis catheter. There is moderate interstitial pulmonary edema with small bilateral pleural effusions and basilar atelectasis. Moderate cardiomegaly. IMPRESSION: Cardiomegaly, small pleural effusions and moderate pulmonary edema. Electronically Signed   By: Ulyses Jarred M.D.   On: 03/02/2018 20:24      Impression / Plan:   Assessment: Principal Problem:   Acute respiratory failure with hypoxia (HCC) Active Problems:   Sepsis ([place])   ESRD on dialysis ([place])   Fluid overload   Diabetes ([place])   HTN (hypertension)   HLD (hyperlipidemia)   Cathy Cline is a 62 y.o. y/o female with chronic anemia.  The patient appears to have longstanding anemia that has fluctuated over the last few months.  The patient also has renal failure which likely is the cause of her anemia.  There is no sign of any GI bleeding and iron studies have not been checked to see if the patient has any sign of iron deficiency.  Plan: 1.  Check iron studies 2.  Follow hemoglobin 3.  Transfuse as needed 4.  The patient is not optimized for any endoscopic procedures at this time and a GI bleed has not been  confirmed in this patient.  Thank you for involving me in the care of this patient.      LOS: 1 day   Lucilla Lame, MD  03/03/2018, 4:04 PM    Note: This dictation was prepared with Dragon dictation along with smaller phrase technology. Any transcriptional errors that result from this process are unintentional.

## 2018-03-03 NOTE — Progress Notes (Signed)
[facility]  ROUNDING NOTE   Subjective:   [name] admitted to [facility] on 03/02/2018 for Acute respiratory distress [R06.03] Acute pulmonary edema ([place]) [J81.0] End stage renal disease on dialysis ([place]) [N18.6, Z99.2]  Required emergent hemodialysis last night. 3 liters UF and 1 unit PRBC. Placed on BIPAP  Granddaughter at bedside. History taken with assistance of interpreter.   Patient had last outpatient dialysis treatment yesterday. 1.4 liters UF.   Objective:  Vital signs in last 24 hours:  Temp:  [98.1 F (36.7 C)-99.3 F (37.4 C)] 99.2 F (37.3 C) (12/13 0400) Pulse Rate:  [88-126] 95 (12/13 0830) Resp:  [15-33] 20 (12/13 0830) BP: (57-193)/(40-86) 150/61 (12/13 0600) SpO2:  [68 %-100 %] 99 % (12/13 0830) FiO2 (%):  [40 %] 40 % (12/13 0400) Weight:  [52.1 kg-61 kg] 52.1 kg (12/13 0215)  Weight change:  Filed Weights   03/02/18 2237 03/02/18 2300 03/03/18 0215  Weight: 54.3 kg 54.3 kg 52.1 kg    Intake/Output: I/O last 3 completed shifts: In: 885.6 [I.V.:71.2; Blood:540; IV Piggyback:274.4] Out: 3202 [Urine:200; Other:3002]   Intake/Output this shift:  Total I/O In: -  Out: 50 [Urine:50]  Physical Exam: General: NAD,   Head: Normocephalic, atraumatic. Moist oral mucosal membranes  Eyes: Anicteric, PERRL  Neck: Supple, trachea midline  Lungs:  Clear to auscultation, 6 L [place]  Heart: Regular rate and rhythm  Abdomen:  Soft, nontender,   Extremities:  1+ peripheral edema.  Neurologic: Nonfocal, moving all four extremities  Skin: No lesions  Access: RIJ    Basic Metabolic Panel: Recent Labs  Lab 03/02/18 2007 03/02/18 2309 03/03/18 0629  NA 134*  --  137  K 4.3  --  3.7  CL 102  --  102  CO2 18*  --  27  GLUCOSE 378*  --  114*  BUN 22  --  16  CREATININE 2.28* 2.33* 1.72*  CALCIUM 8.0*  --  8.0*    Liver Function Tests: Recent Labs  Lab 03/02/18 2007  AST 38  ALT 23  ALKPHOS 104  BILITOT 0.5  PROT 6.9   ALBUMIN 2.7*   No results for input(s): LIPASE, AMYLASE in the last 168 hours. No results for input(s): AMMONIA in the last 168 hours.  CBC: Recent Labs  Lab 03/02/18 2007 03/02/18 2309 03/03/18 0629  WBC 10.7* 11.1* 10.0  HGB 6.4* 6.0* 7.4*  HCT 20.9* 19.0* 22.9*  MCV 87.4 86.4 86.4  PLT 521* 422* 359    Cardiac Enzymes: Recent Labs  Lab 03/02/18 2007 03/03/18 0629  TROPONINI 0.04* 0.21*    BNP: Invalid input(s): POCBNP  CBG: Recent Labs  Lab 02/24/18 1600 03/02/18 2239 03/03/18 0552 03/03/18 0734  GLUCAP 136* 355* 109* 102*    Microbiology: Results for orders placed or performed during the hospital encounter of 03/02/18  Blood Culture (routine x 2)     Status: None (Preliminary result)   Collection Time: 03/02/18  8:33 PM  Result Value Ref Range Status   Specimen Description BLOOD RIGHT ANTECUBITAL  Final   Special Requests   Final    BOTTLES DRAWN AEROBIC AND ANAEROBIC Blood Culture results may not be optimal due to an inadequate volume of blood received in culture bottles   Culture   Final    NO GROWTH < 12 HOURS Performed at [facility], [address] [place], [place] [address]    Report Status PENDING  Incomplete  Blood Culture (routine x 2)  Status: None (Preliminary result)   Collection Time: 03/02/18  8:33 PM  Result Value Ref Range Status   Specimen Description BLOOD BLOOD LEFT HAND  Final   Special Requests   Final    BOTTLES DRAWN AEROBIC AND ANAEROBIC Blood Culture adequate volume   Culture   Final    NO GROWTH < 12 HOURS Performed at [facility], [address], [place], [place] [address]    Report Status PENDING  Incomplete  MRSA PCR Screening     Status: None   Collection Time: 03/03/18 12:15 AM  Result Value Ref Range Status   MRSA by PCR NEGATIVE NEGATIVE Final    Comment:        The GeneXpert MRSA Assay (FDA approved for NASAL specimens only), is one component of a comprehensive MRSA  colonization surveillance program. It is not intended to diagnose MRSA infection nor to guide or monitor treatment for MRSA infections. Performed at [facility], [address] [place], [place] [address]     Coagulation Studies: No results for input(s): LABPROT, INR in the last 72 hours.  Urinalysis: No results for input(s): COLORURINE, LABSPEC, PHURINE, GLUCOSEU, HGBUR, BILIRUBINUR, KETONESUR, PROTEINUR, UROBILINOGEN, NITRITE, LEUKOCYTESUR in the last 72 hours.  Invalid input(s): APPERANCEUR    Imaging: Dg Chest Portable 1 View  Result Date: 03/02/2018 CLINICAL DATA:  Hemoptysis and shortness of breath EXAM: PORTABLE CHEST 1 VIEW COMPARISON:  02/20/2018 FINDINGS: Left IJ approach central venous catheter has been removed in there is now a right IJ approach hemodialysis catheter. There is moderate interstitial pulmonary edema with small bilateral pleural effusions and basilar atelectasis. Moderate cardiomegaly. IMPRESSION: Cardiomegaly, small pleural effusions and moderate pulmonary edema. Electronically Signed   By: Ulyses Jarred M.D.   On: 03/02/2018 20:24     Medications:   . ceFEPime (MAXIPIME) IV    . norepinephrine (LEVOPHED) Adult infusion Stopped (03/03/18 0156)   . sodium chloride   Intravenous Once  . Chlorhexidine Gluconate Cloth  6 each Topical Q0600  . furosemide  80 mg Intravenous Once  . insulin aspart  0-15 Units Subcutaneous Q4H  . insulin detemir  10 Units Subcutaneous QHS  . pantoprazole (PROTONIX) IV  40 mg Intravenous Q12H   acetaminophen **OR** acetaminophen, ondansetron **OR** ondansetron (ZOFRAN) IV  Assessment/ Plan:  [name] is a 62 y.o. Hispanic female Spanish Speaking only, with acute renal failure requiring hemodialysis, diabetes mellitus type II, coronary artery disease, hypertension,   [facility] TTS 2103min 54kg RIJ permcath  1. Acute Renal Failure requiring outpatient dialysis. 2 hemodialysis  treatments in last 24 hour treatment. UF total of 4.4 liters.  Emergent hemodialysis last night due to respiratory distress. Pulmonary edema on examination. Required noninvasive ventilation.  - Check CXR today.  - IV fursoemide - Monitor daily for dialysis need.   2. Hypertension: - IV furosemide as above.  - Consider echocardiogram  3. Anemia of chronic kidney disease: hemoglobin 7.4 - EPO with TTS schedule  4. Secondary Hyperparathyroidism: not currently on binders.   5. Diabetes mellitus type II with chronic kidney disease:  - continue glucose levels.    LOS: 1 [place] 12/13/20199:37 AM

## 2018-03-04 LAB — BASIC METABOLIC PANEL
Anion gap: 9 (ref 5–15)
BUN: 27 mg/dL — ABNORMAL HIGH (ref 8–23)
CHLORIDE: 105 mmol/L (ref 98–111)
CO2: 27 mmol/L (ref 22–32)
CREATININE: 2.91 mg/dL — AB (ref 0.44–1.00)
Calcium: 8.1 mg/dL — ABNORMAL LOW (ref 8.9–10.3)
GFR calc Af Amer: 19 mL/min — ABNORMAL LOW (ref >60)
GFR calc non Af Amer: 17 mL/min — ABNORMAL LOW (ref >60)
Glucose, Bld: 120 mg/dL — ABNORMAL HIGH (ref 70–99)
Potassium: 3 mmol/L — ABNORMAL LOW (ref 3.5–5.1)
Sodium: 141 mmol/L (ref 135–145)

## 2018-03-04 LAB — TYPE AND SCREEN
ABO/RH(D): A POS
Antibody Screen: NEGATIVE
Unit division: 0

## 2018-03-04 LAB — CBC
HCT: 23.4 % — ABNORMAL LOW (ref 36.0–46.0)
Hemoglobin: 7.4 g/dL — ABNORMAL LOW (ref 12.0–15.0)
MCH: 28 pg (ref 26.0–34.0)
MCHC: 31.6 g/dL (ref 30.0–36.0)
MCV: 88.6 fL (ref 80.0–100.0)
Platelets: 360 10*3/uL (ref 150–400)
RBC: 2.64 MIL/uL — ABNORMAL LOW (ref 3.87–5.11)
RDW: 17.7 % — ABNORMAL HIGH (ref 11.5–15.5)
WBC: 9.6 10*3/uL (ref 4.0–10.5)
nRBC: 0 % (ref 0.0–0.2)

## 2018-03-04 LAB — GLUCOSE, CAPILLARY
Glucose-Capillary: 117 mg/dL — ABNORMAL HIGH (ref 70–99)
Glucose-Capillary: 194 mg/dL — ABNORMAL HIGH (ref 70–99)
Glucose-Capillary: 234 mg/dL — ABNORMAL HIGH (ref 70–99)
Glucose-Capillary: 89 mg/dL (ref 70–99)

## 2018-03-04 LAB — BPAM RBC
Blood Product Expiration Date: 201912232359
ISSUE DATE / TIME: 201912130047
Unit Type and Rh: 600

## 2018-03-04 LAB — PROCALCITONIN: Procalcitonin: 3.65 ng/mL

## 2018-03-04 LAB — HIV ANTIBODY (ROUTINE TESTING W REFLEX): HIV Screen 4th Generation wRfx: NONREACTIVE

## 2018-03-04 MED ORDER — LEVOFLOXACIN 500 MG PO TABS
500.0000 mg | ORAL_TABLET | ORAL | Status: DC
  Filled 2018-03-04: qty 1

## 2018-03-04 MED ORDER — LEVOFLOXACIN 750 MG PO TABS
750.0000 mg | ORAL_TABLET | Freq: Once | ORAL | Status: AC
  Administered 2018-03-04: 750 mg via ORAL
  Filled 2018-03-04: qty 1

## 2018-03-04 MED ORDER — EPOETIN ALFA 10000 UNIT/ML IJ SOLN
10000.0000 [IU] | INTRAMUSCULAR | Status: DC
  Administered 2018-03-04: 10000 [IU] via INTRAVENOUS

## 2018-03-04 MED ORDER — LEVOFLOXACIN 500 MG PO TABS: 500.0000 mg | ORAL_TABLET | ORAL | 0 refills | Status: DC

## 2018-03-04 NOTE — Progress Notes (Signed)
[facility]  ROUNDING NOTE   Subjective:   Seen and examined on hemodialysis. Tolerating treatment well. UF goal of 1.5 liters.   History taken with assistance of Spanish Interpreter    HEMODIALYSIS FLOWSHEET:  Blood Flow Rate (mL/min): 300 mL/min Arterial Pressure (mmHg): -90 mmHg Venous Pressure (mmHg): 90 mmHg Transmembrane Pressure (mmHg): 60 mmHg Ultrafiltration Rate (mL/min): 770 mL/min Dialysate Flow Rate (mL/min): 600 ml/min Conductivity: Machine : 14 Conductivity: Machine : 14 Dialysis Fluid Bolus: Normal Saline Bolus Amount (mL): 200 mL    Objective:  Vital signs in last 24 hours:  Temp:  [98.3 F (36.8 C)-99.2 F (37.3 C)] 99.2 F (37.3 C) (12/14 1030) Pulse Rate:  [73-108] 98 (12/14 1200) Resp:  [15-27] 23 (12/14 1200) BP: (147-188)/(51-65) 186/65 (12/14 1200) SpO2:  [87 %-99 %] 90 % (12/14 1200)  Weight change:  Filed Weights   03/02/18 2237 03/02/18 2300 03/03/18 0215  Weight: 54.3 kg 54.3 kg 52.1 kg    Intake/Output: I/O last 3 completed shifts: In: 1105.5 [P.O.:120; I.V.:71.2; Blood:540; IV Piggyback:374.4] Out: 4627 [Urine:1625; Other:3002]   Intake/Output this shift:  Total I/O In: -  Out: 300 [Urine:300]  Physical Exam: General: NAD,   Head: Normocephalic, atraumatic. Moist oral mucosal membranes  Eyes: Anicteric, PERRL  Neck: Supple, trachea midline  Lungs:  Clear to auscultation, 6 L   Heart: Regular rate and rhythm  Abdomen:  Soft, nontender,   Extremities:  1+ peripheral edema.  Neurologic: Nonfocal, moving all four extremities  Skin: No lesions  Access: RIJ    Basic Metabolic Panel: Recent Labs  Lab 03/02/18 2007 03/02/18 2309 03/03/18 0629 03/04/18 0523  NA 134*  --  137 141  K 4.3  --  3.7 3.0*  CL 102  --  102 105  CO2 18*  --  27 27  GLUCOSE 378*  --  114* 120*  BUN 22  --  16 27*  CREATININE 2.28* 2.33* 1.72* 2.91*  CALCIUM 8.0*  --  8.0* 8.1*    Liver Function Tests: Recent Labs  Lab  03/02/18 2007  AST 38  ALT 23  ALKPHOS 104  BILITOT 0.5  PROT 6.9  ALBUMIN 2.7*   No results for input(s): LIPASE, AMYLASE in the last 168 hours. No results for input(s): AMMONIA in the last 168 hours.  CBC: Recent Labs  Lab 03/02/18 2007 03/02/18 2309 03/03/18 0629 03/04/18 0523  WBC 10.7* 11.1* 10.0 9.6  HGB 6.4* 6.0* 7.4* 7.4*  HCT 20.9* 19.0* 22.9* 23.4*  MCV 87.4 86.4 86.4 88.6  PLT 521* 422* 359 360    Cardiac Enzymes: Recent Labs  Lab 03/02/18 2007 03/03/18 0629 03/03/18 1231  TROPONINI 0.04* 0.21* 0.17*    BNP: Invalid input(s): POCBNP  CBG: Recent Labs  Lab 03/03/18 1632 03/03/18 2151 03/04/18 0025 03/04/18 0508 03/04/18 0743  GLUCAP 209* 191* 194* 117* 22    Microbiology: Results for orders placed or performed during the hospital encounter of 03/02/18  Blood Culture (routine x 2)     Status: None (Preliminary result)   Collection Time: 03/02/18  8:33 PM  Result Value Ref Range Status   Specimen Description BLOOD RIGHT ANTECUBITAL  Final   Special Requests   Final    BOTTLES DRAWN AEROBIC AND ANAEROBIC Blood Culture results may not be optimal due to an inadequate volume of blood received in culture bottles   Culture   Final    NO GROWTH 2 DAYS Performed at [facility], [address] [place],  [place] [address]    Report Status PENDING  Incomplete  Blood Culture (routine x 2)     Status: None (Preliminary result)   Collection Time: 03/02/18  8:33 PM  Result Value Ref Range Status   Specimen Description BLOOD BLOOD LEFT HAND  Final   Special Requests   Final    BOTTLES DRAWN AEROBIC AND ANAEROBIC Blood Culture adequate volume   Culture   Final    NO GROWTH 2 DAYS Performed at [facility], [address], [place], [place] [address]    Report Status PENDING  Incomplete  MRSA PCR Screening     Status: None   Collection Time: 03/03/18 12:15 AM  Result Value Ref Range Status   MRSA by PCR NEGATIVE NEGATIVE Final     Comment:        The GeneXpert MRSA Assay (FDA approved for NASAL specimens only), is one component of a comprehensive MRSA colonization surveillance program. It is not intended to diagnose MRSA infection nor to guide or monitor treatment for MRSA infections. Performed at [facility], [address] [place], [place] [address]     Coagulation Studies: No results for input(s): LABPROT, INR in the last 72 hours.  Urinalysis: No results for input(s): COLORURINE, LABSPEC, PHURINE, GLUCOSEU, HGBUR, BILIRUBINUR, KETONESUR, PROTEINUR, UROBILINOGEN, NITRITE, LEUKOCYTESUR in the last 72 hours.  Invalid input(s): APPERANCEUR    Imaging: Dg Chest Port 1 View  Result Date: 03/03/2018 CLINICAL DATA:  Acute respiratory failure EXAM: PORTABLE CHEST 1 VIEW COMPARISON:  03/02/2018 FINDINGS: Cardiomegaly. Diffuse bilateral airspace opacities compatible with edema/CHF. Layering bilateral effusions. No real change since prior study. Right dialysis catheter remains in place, unchanged. IMPRESSION: Continued pulmonary edema/CHF with layering effusions. No real change. Electronically Signed   By: Rolm Baptise M.D.   On: 03/03/2018 10:04   Dg Chest Portable 1 View  Result Date: 03/02/2018 CLINICAL DATA:  Hemoptysis and shortness of breath EXAM: PORTABLE CHEST 1 VIEW COMPARISON:  02/20/2018 FINDINGS: Left IJ approach central venous catheter has been removed in there is now a right IJ approach hemodialysis catheter. There is moderate interstitial pulmonary edema with small bilateral pleural effusions and basilar atelectasis. Moderate cardiomegaly. IMPRESSION: Cardiomegaly, small pleural effusions and moderate pulmonary edema. Electronically Signed   By: Ulyses Jarred M.D.   On: 03/02/2018 20:24     Medications:    . sodium chloride   Intravenous Once  . amLODipine  10 mg Oral Daily  . Chlorhexidine Gluconate Cloth  6 each Topical Q0600  . epoetin (EPOGEN/PROCRIT) injection  10,000 Units  Intravenous Q T,Th,Sa-HD  . insulin aspart  0-15 Units Subcutaneous Q4H  . insulin detemir  10 Units Subcutaneous QHS  . levofloxacin  750 mg Oral Once   Followed by  . [START ON 03/06/2018] levofloxacin  500 mg Oral Q48H  . metoprolol tartrate  25 mg Oral BID  . pantoprazole  40 mg Oral BID   acetaminophen **OR** acetaminophen, ondansetron **OR** ondansetron (ZOFRAN) IV  Assessment/ Plan:  [name] is a 62 y.o. Hispanic female Spanish Speaking only, with acute renal failure requiring hemodialysis, diabetes mellitus type II, coronary artery disease, hypertension,   [facility] TTS 264min 54kg RIJ permcath  1. Acute Renal Failure requiring outpatient dialysis.   Emergent hemodialysis on admission due to respiratory distress. Seen and examined on hemodialysis. Tolerating treatment well.  - Will need 24 hour urine for creatinine clearance as outpatient.   2. Hypertension: blood pressure at goal.  3. Anemia of chronic kidney disease: hemoglobin 7.4 - EPO with TTS schedule  4. Secondary Hyperparathyroidism: not currently on binders.   5. Diabetes mellitus type II with chronic kidney disease:  - continue glucose levels.    LOS: 2 Cathy Cline 12/14/201912:08 PM

## 2018-03-04 NOTE — Progress Notes (Signed)
SATURATION QUALIFICATIONS: (This note is used to comply with regulatory documentation for home oxygen)  Patient Saturations on Room Air at Rest = 92%  Patient Saturations on Room Air while Ambulating = 90-95%  Patient Saturations on 0 Liters of oxygen while Ambulating = 90-95%  Please briefly explain why patient needs home oxygen:

## 2018-03-04 NOTE — Discharge Summary (Signed)
[facility] [place] NAME: Cathy Cline    MR#:  [id number]  DATE OF BIRTH:  September 25, 1955  DATE OF ADMISSION:  03/02/2018 ADMITTING PHYSICIAN: Lance Coon, MD  DATE OF DISCHARGE: 03/04/2018  PRIMARY CARE PHYSICIAN: Petra Kuba, MD    ADMISSION DIAGNOSIS:  Acute respiratory distress [R06.03] Acute pulmonary edema (HCC) [J81.0] End stage renal disease on dialysis ([place]) [N18.6, Z99.2]  DISCHARGE DIAGNOSIS:  Principal Problem:   Acute respiratory failure with hypoxia (HCC) Active Problems:   Sepsis ([place])   ESRD on dialysis ([place])   Fluid overload   Diabetes (HCC)   HTN (hypertension)   HLD (hyperlipidemia)   End stage renal disease on dialysis ([place])   Anemia of chronic disease   SECONDARY DIAGNOSIS:   Past Medical History:  Diagnosis Date  . Carotid stenosis   . Diabetes mellitus without complication ([place])   . ESRD on dialysis ([place])   . Hypercholesterolemia   . Hypertension   . Myocardial infarction ([place])   . Tachycardia     HOSPITAL COURSE:  62 year old female with history of end-stage renal disease on hemodialysis and diabetes who presented with shortness of breath.  1.  Acute hypoxic respiratory failure in the setting of pulmonary edema from end-stage renal disease: Patient off of BiPAP and weaned off nasal cannula  2.  Pulmonary edema due to end-stage renal disease on hemodialysis: Underwent hemodialysis.     3.  Sepsis: Patient presented with tachypnea, hypotension and tachycardia Sepsis was due to underlying pneumonia Will be discharged on oral Levaquin which is renally dosed   4.  Diabetes: Continue ADA diet with outpatient regimen  5.  Essential hypertension: Continue Norvasc and metoprolol   6.  Anemia acute on chronic kidney disease: Epogen Was eval by GI.  She has no GI source of blood loss.  This is all due to chronic kidney disease.  Her hemoglobin has remained stable.  DISCHARGE  CONDITIONS AND DIET:   Discharged in stable condition on diabetic heart healthy renal diet  CONSULTS OBTAINED:  Treatment Team:  Lucilla Lame, MD  DRUG ALLERGIES:  No Known Allergies  DISCHARGE MEDICATIONS:   Allergies as of 03/04/2018   No Known Allergies     Medication List    TAKE these medications   amLODipine 5 MG tablet Commonly known as:  NORVASC Take 1 tablet (5 mg total) by mouth daily.   aspirin 81 MG tablet Take 81 mg by mouth daily.   cetirizine 10 MG tablet Commonly known as:  ZYRTEC Take 10 mg by mouth at bedtime.   docusate sodium 100 MG capsule Commonly known as:  COLACE Take 1 capsule (100 mg total) by mouth 2 (two) times daily.   feeding supplement (NEPRO CARB STEADY) Liqd Take 237 mLs by mouth 2 (two) times daily between meals for 15 days.   insulin detemir 100 UNIT/ML injection Commonly known as:  LEVEMIR Inject 20-25 Units into the skin See admin instructions. Inject 25u under the skin every morning and inject 20u under the skin ever evening   levofloxacin 500 MG tablet Commonly known as:  LEVAQUIN Take 1 tablet (500 mg total) by mouth every other day for 2 days. Start Monday 12/16 Start taking on:  March 06, 2018   metoprolol tartrate 25 MG tablet Commonly known as:  LOPRESSOR Take 25 mg by mouth 2 (two) times daily.   multivitamin Tabs tablet Take 1 tablet by mouth at bedtime.  Today   CHIEF COMPLAINT:   No Acute events overnight   VITAL SIGNS:  Blood pressure (!) 167/73, pulse 76, temperature 99.2 F (37.3 C), resp. rate 16, height 4\' 11"  (1.499 m), weight 52.1 kg, SpO2 90 %.   REVIEW OF SYSTEMS:  Review of Systems  Constitutional: Negative.  Negative for chills, fever and malaise/fatigue.  HENT: Negative.  Negative for ear discharge, ear pain, hearing loss, nosebleeds and sore throat.   Eyes: Negative.  Negative for blurred vision and pain.  Respiratory: Positive for cough. Negative for hemoptysis,  shortness of breath and wheezing.   Cardiovascular: Negative.  Negative for chest pain, palpitations and leg swelling.  Gastrointestinal: Negative.  Negative for abdominal pain, blood in stool, diarrhea, nausea and vomiting.  Genitourinary: Negative.  Negative for dysuria.  Musculoskeletal: Negative.  Negative for back pain.  Skin: Negative.   Neurological: Negative for dizziness, tremors, speech change, focal weakness, seizures and headaches.  Endo/Heme/Allergies: Negative.  Does not bruise/bleed easily.  Psychiatric/Behavioral: Negative.  Negative for depression, hallucinations and suicidal ideas.     PHYSICAL EXAMINATION:  GENERAL:  62 y.o.-year-old patient lying in the bed with no acute distress.  NECK:  Supple, no jugular venous distention. No thyroid enlargement, no tenderness.  LUNGS: Normal breath sounds bilaterally, no wheezing, rales,rhonchi  No use of accessory muscles of respiration.  CARDIOVASCULAR: S1, S2 normal. No murmurs, rubs, or gallops.  ABDOMEN: Soft, non-tender, non-distended. Bowel sounds present. No organomegaly or mass.  EXTREMITIES: No pedal edema, cyanosis, or clubbing.  PSYCHIATRIC: The patient is alert and oriented x 3.  SKIN: No obvious rash, lesion, or ulcer.   DATA REVIEW:   CBC Recent Labs  Lab 03/04/18 0523  WBC 9.6  HGB 7.4*  HCT 23.4*  PLT 360    Chemistries  Recent Labs  Lab 03/02/18 2007  03/04/18 0523  NA 134*   < > 141  K 4.3   < > 3.0*  CL 102   < > 105  CO2 18*   < > 27  GLUCOSE 378*   < > 120*  BUN 22   < > 27*  CREATININE 2.28*   < > 2.91*  CALCIUM 8.0*   < > 8.1*  AST 38  --   --   ALT 23  --   --   ALKPHOS 104  --   --   BILITOT 0.5  --   --    < > = values in this interval not displayed.    Cardiac Enzymes Recent Labs  Lab 03/02/18 2007 03/03/18 0629 03/03/18 1231  TROPONINI 0.04* 0.21* 0.17*    Microbiology Results  @MICRORSLT48 @  RADIOLOGY:  Dg Chest Port 1 View  Result Date: 03/03/2018 CLINICAL  DATA:  Acute respiratory failure EXAM: PORTABLE CHEST 1 VIEW COMPARISON:  03/02/2018 FINDINGS: Cardiomegaly. Diffuse bilateral airspace opacities compatible with edema/CHF. Layering bilateral effusions. No real change since prior study. Right dialysis catheter remains in place, unchanged. IMPRESSION: Continued pulmonary edema/CHF with layering effusions. No real change. Electronically Signed   By: Rolm Baptise M.D.   On: 03/03/2018 10:04   Dg Chest Portable 1 View  Result Date: 03/02/2018 CLINICAL DATA:  Hemoptysis and shortness of breath EXAM: PORTABLE CHEST 1 VIEW COMPARISON:  02/20/2018 FINDINGS: Left IJ approach central venous catheter has been removed in there is now a right IJ approach hemodialysis catheter. There is moderate interstitial pulmonary edema with small bilateral pleural effusions and basilar atelectasis. Moderate cardiomegaly. IMPRESSION: Cardiomegaly, small pleural effusions and  moderate pulmonary edema. Electronically Signed   By: Ulyses Jarred M.D.   On: 03/02/2018 20:24      Allergies as of 03/04/2018   No Known Allergies     Medication List    TAKE these medications   amLODipine 5 MG tablet Commonly known as:  NORVASC Take 1 tablet (5 mg total) by mouth daily.   aspirin 81 MG tablet Take 81 mg by mouth daily.   cetirizine 10 MG tablet Commonly known as:  ZYRTEC Take 10 mg by mouth at bedtime.   docusate sodium 100 MG capsule Commonly known as:  COLACE Take 1 capsule (100 mg total) by mouth 2 (two) times daily.   feeding supplement (NEPRO CARB STEADY) Liqd Take 237 mLs by mouth 2 (two) times daily between meals for 15 days.   insulin detemir 100 UNIT/ML injection Commonly known as:  LEVEMIR Inject 20-25 Units into the skin See admin instructions. Inject 25u under the skin every morning and inject 20u under the skin ever evening   levofloxacin 500 MG tablet Commonly known as:  LEVAQUIN Take 1 tablet (500 mg total) by mouth every other day for 2 days.  Start Monday 12/16 Start taking on:  March 06, 2018   metoprolol tartrate 25 MG tablet Commonly known as:  LOPRESSOR Take 25 mg by mouth 2 (two) times daily.   multivitamin Tabs tablet Take 1 tablet by mouth at bedtime.         Management plans discussed with the patient and she is in agreement. Stable for discharge home  Patient should follow up with pcp  CODE STATUS:     Code Status Orders  (From admission, onward)         Start     Ordered   03/02/18 2237  Full code  Continuous     03/02/18 2236        Code Status History    Date Active Date Inactive Code Status Order ID Comments User Context   02/14/2018 2203 02/25/2018 0004 Full Code [id number]  Harrie Foreman, MD Inpatient      TOTAL TIME TAKING CARE OF THIS PATIENT: 38 minutes.    Note: This dictation was prepared with Dragon dictation along with smaller phrase technology. Any transcriptional errors that result from this process are unintentional.  Nero Sawatzky M.D on 03/04/2018 at 12:57 PM  Between 7am to 6pm - Pager - [phone] After 6pm go to [email or web address] - password [email or web address] [facility]  Office  [phone]  CC: Primary care physician; Petra Kuba, MD

## 2018-03-04 NOTE — Progress Notes (Addendum)
Pharmacy Antibiotic Note  Cathy Cline is a 62 y.o. female admitted on 03/02/2018 with sepsis.  Pharmacy was consulted for levofloxacin dosing. Patient is ESRD on HD on a TTS schedule.   Plan: Levofloxacin 750 mg PO x 1 today at 1800, followed by levofloxacin 500 mg q48h to start 12/16   Height: 4\' 11"  (149.9 cm) Weight: 114 lb 13.8 oz (52.1 kg) IBW/kg (Calculated) : 43.2  Temp (24hrs), Avg:98.7 F (37.1 C), Min:98.3 F (36.8 C), Max:99.2 F (37.3 C)  Recent Labs  Lab 03/02/18 2007 03/02/18 2034 03/02/18 2309 03/03/18 0629 03/04/18 0523  WBC 10.7*  --  11.1* 10.0 9.6  CREATININE 2.28*  --  2.33* 1.72* 2.91*  LATICACIDVEN  --  3.6* 1.0  --   --     Estimated Creatinine Clearance: 15 mL/min (A) (by C-G formula based on SCr of 2.91 mg/dL (H)).    No Known Allergies  Antimicrobials this admission: Vancomycin 12/10 >> 12/12 Cefepime 12/10 >> 12/13 Levofloxacin 12/14 >>  Thank you for allowing pharmacy to be a part of this patient's care.  Tawnya Crook, PharmD Pharmacy Resident  03/04/2018 11:15 AM

## 2018-03-04 NOTE — Progress Notes (Signed)
This note also relates to the following rows which could not be included: Pulse Rate - Cannot attach notes to unvalidated device data Resp - Cannot attach notes to unvalidated device data BP - Cannot attach notes to unvalidated device data  Hd copleted  

## 2018-03-04 NOTE — Progress Notes (Addendum)
MD order to discharge patient home. She was ambulated one lap around the nurses station without oxygen. Her O2 sat did not drop below 90%. Discharge instructions were reviewed with patient and her family. A Spanish speaking interpreter. Questions were encouraged and answered. Dr. Juleen China also came to answer the patient and her family's questions. She is being transported off the unit for discharge at this time.

## 2018-03-04 NOTE — Progress Notes (Signed)
This note also relates to the following rows which could not be included: Pulse Rate - Cannot attach notes to unvalidated device data Resp - Cannot attach notes to unvalidated device data  Hd started  

## 2018-03-07 LAB — CULTURE, BLOOD (ROUTINE X 2)
CULTURE: NO GROWTH
Culture: NO GROWTH
Special Requests: ADEQUATE

## 2018-03-08 ENCOUNTER — Other Ambulatory Visit: Payer: Self-pay

## 2018-03-08 ENCOUNTER — Encounter: Payer: Self-pay | Admitting: General Surgery

## 2018-03-08 ENCOUNTER — Ambulatory Visit (INDEPENDENT_AMBULATORY_CARE_PROVIDER_SITE_OTHER): Payer: 59 | Admitting: General Surgery

## 2018-03-08 VITALS — BP 169/71 | HR 84 | Temp 99.0°F | Resp 12 | Wt 102.0 lb

## 2018-03-08 DIAGNOSIS — Z09 Encounter for follow-up examination after completed treatment for conditions other than malignant neoplasm: Secondary | ICD-10-CM | POA: Diagnosis not present

## 2018-03-08 DIAGNOSIS — K8000 Calculus of gallbladder with acute cholecystitis without obstruction: Secondary | ICD-10-CM

## 2018-03-08 NOTE — Progress Notes (Signed)
[name] is seen today in follow-up after her hospitalization for urosepsis in November 2019.  During that hospitalization she was noted to have elevated liver transaminases.  A right upper quadrant ultrasound was obtained that was concerning for cholecystitis.  Subsequent nuclear medicine scan also was consistent with this diagnosis.  The diagnosis was made in the setting of profound sepsis and hypotension requiring vasopressor support.  She was certainly too ill to undergo any sort of surgical intervention at that time, and it is not entirely clear whether the findings actually represented an infectious process of the gallbladder or were secondary findings related to her septicemia.  During that hospitalization she had acute renal insufficiency and is now requiring 3 times weekly hemodialysis.  Since her discharge from that hospitalization, she was subsequently readmitted to the hospital and only discharged on the 14th of this month.  Her diagnosis at that time was once again, sepsis.  She also had fluid overload despite having undergone hemodialysis the day of her admission.  She was treated for presumed pneumonia.  She is here today, accompanied by her daughter, to discuss whether or not surgical intervention is required for her gallbladder. Interview conducted with the assistance of a Spanish-language interpreter.  Past Medical History:  Diagnosis Date  . Carotid stenosis   . Diabetes mellitus without complication ([place])   . ESRD on dialysis ([place])   . Hypercholesterolemia   . Hypertension   . Myocardial infarction ([place])   . Tachycardia    Past Surgical History:  Procedure Laterality Date  . CARDIAC CATHETERIZATION    . COLONOSCOPY WITH PROPOFOL N/A 10/02/2015   Procedure: COLONOSCOPY WITH PROPOFOL;  Surgeon: Manya Silvas, MD;  Location: [facility] ENDOSCOPY;  Service: Endoscopy;  Laterality: N/A;  . DIALYSIS/PERMA CATHETER INSERTION N/A 02/23/2018   Procedure: DIALYSIS/PERMA CATHETER INSERTION;   Surgeon: Algernon Huxley, MD;  Location: [facility] CV LAB;  Service: Cardiovascular;  Laterality: N/A;  . KNEE SURGERY    . TUBAL LIGATION     No family history on file. Social History   Tobacco Use  . Smoking status: Never Smoker  . Smokeless tobacco: Never Used  Substance Use Topics  . Alcohol use: No  . Drug use: No   Current Meds  Medication Sig  . amLODipine (NORVASC) 5 MG tablet Take 1 tablet (5 mg total) by mouth daily.  Marland Kitchen aspirin 81 MG tablet Take 81 mg by mouth daily.  . cetirizine (ZYRTEC) 10 MG tablet Take 10 mg by mouth at bedtime.  . docusate sodium (COLACE) 100 MG capsule Take 1 capsule (100 mg total) by mouth 2 (two) times daily.  . insulin detemir (LEVEMIR) 100 UNIT/ML injection Inject 20-25 Units into the skin See admin instructions. Inject 25u under the skin every morning and inject 20u under the skin ever evening  . metoprolol tartrate (LOPRESSOR) 25 MG tablet Take 25 mg by mouth 2 (two) times daily.  . multivitamin (RENA-VIT) TABS tablet Take 1 tablet by mouth at bedtime.  . Nutritional Supplements (FEEDING SUPPLEMENT, NEPRO CARB STEADY,) LIQD Take 237 mLs by mouth 2 (two) times daily between meals for 15 days.  . [DISCONTINUED] levofloxacin (LEVAQUIN) 500 MG tablet Take 1 tablet (500 mg total) by mouth every other day for 2 days. Start Monday 12/16   No Known Allergies  Vitals:   03/08/18 1322  BP: (!) 169/71  Pulse: 84  Resp: 12  Temp: 99 F (37.2 C)  SpO2: 95%   Gen: ill-appearing elderly female, NAD She  is alert and oriented and responds appropriately to questions.  Mood and affect are congruent. She has a dialysis catheter in her right IJV.  Data Reviewed: EMR reviewed for recent hospitalization history, labs and imaging studies from the time of initial surgical consultation.  A/P: [name] is a 62 year old woman who was initially seen by general surgery for consultation regarding possible acute cholecystitis in the setting taking of profound  sepsis.  The laboratory and imaging findings may simply have been a reflection of her acute illness.  She denies any current right upper quadrant pain nor did she have significant pain during her hospital course.  She has never had right upper quadrant pain.  She did have sludge and gallstones visualized on her ultrasound.  At this time she remains too ill to undergo what is essentially an elective operation.  It may be that she will never require cholecystectomy, however I would like to see her back in clinic in 4 months' time.  We will reevaluate her health at that time and discuss whether or not she will need to undergo an operation or if we can simply wait for her to have any symptoms.

## 2018-03-08 NOTE — Patient Instructions (Addendum)
The patient is aware to call back for any questions or new concerns.  Follow up in 4 months

## 2018-03-21 LAB — HEPATITIS C ANTIBODY: HCV Ab: UNDETERMINED

## 2018-03-29 ENCOUNTER — Encounter (INDEPENDENT_AMBULATORY_CARE_PROVIDER_SITE_OTHER): Payer: Self-pay | Admitting: Nurse Practitioner

## 2018-03-29 ENCOUNTER — Other Ambulatory Visit (INDEPENDENT_AMBULATORY_CARE_PROVIDER_SITE_OTHER): Payer: Self-pay

## 2018-03-29 ENCOUNTER — Other Ambulatory Visit (INDEPENDENT_AMBULATORY_CARE_PROVIDER_SITE_OTHER): Payer: Self-pay | Admitting: Vascular Surgery

## 2018-03-29 ENCOUNTER — Ambulatory Visit (INDEPENDENT_AMBULATORY_CARE_PROVIDER_SITE_OTHER): Payer: Self-pay | Admitting: Nurse Practitioner

## 2018-03-29 VITALS — BP 159/66 | HR 76 | Resp 16 | Ht 59.0 in | Wt 102.4 lb

## 2018-03-29 DIAGNOSIS — Z992 Dependence on renal dialysis: Secondary | ICD-10-CM

## 2018-03-29 DIAGNOSIS — N186 End stage renal disease: Secondary | ICD-10-CM

## 2018-03-29 DIAGNOSIS — I120 Hypertensive chronic kidney disease with stage 5 chronic kidney disease or end stage renal disease: Secondary | ICD-10-CM

## 2018-03-29 DIAGNOSIS — I10 Essential (primary) hypertension: Secondary | ICD-10-CM

## 2018-03-29 DIAGNOSIS — E785 Hyperlipidemia, unspecified: Secondary | ICD-10-CM

## 2018-03-29 NOTE — Progress Notes (Signed)
Subjective:    Patient ID: Cathy Cline, female    DOB: 1956/03/15, 63 y.o.   MRN: [id number] No chief complaint on file.   HPI  Cathy Cline is a 63 y.o. female presents today for evaluation for permanent dialysis access.  The patient presented to [facility] on 02/14/2018 after being seen at her primary care provider's office for worsening left flank pain with nausea and vomiting.  She was found to have pyelonephritis and severe acute kidney injury which required PermCath placement on 02/23/2018.  Since this time the patient has continued on hemodialysis with her PermCath.  She states that yesterday she underwent lab work as well as urine studies at her dialysis and her to determine if her renal function has recovered any.  She states they are still unsure as to whether she will be on dialysis permanently or if her renal function would recover.  The patient is not yet anuric.  She denies any fever, chills, nausea, vomiting or diarrhea while on dialysis.  She denies any chest pain or shortness of breath.The patient is right-handed.  The patient has been considering the various methods of dialysis and wishes to proceed with hemodialysis and therefore creation of AV access.  The patient denies amaurosis fugax or recent TIA symptoms. There are no recent neurological changes noted. The patient denies claudication symptoms or rest pain symptoms. The patient denies history of DVT, PE or superficial thrombophlebitis. The patient denies recent episodes of angina or shortness of breath.   Patient underwent upper extremity vein mapping today.which shows veins too small for AV fistula creation, but adequate for a left brachial axillary graft  Past Medical History:  Diagnosis Date  . Carotid stenosis   . Diabetes mellitus without complication ([place])   . ESRD on dialysis ([place])   . Hypercholesterolemia   . Hypertension   . Myocardial infarction ([place])   .  Tachycardia     Past Surgical History:  Procedure Laterality Date  . CARDIAC CATHETERIZATION    . COLONOSCOPY WITH PROPOFOL N/A 10/02/2015   Procedure: COLONOSCOPY WITH PROPOFOL;  Surgeon: Manya Silvas, MD;  Location: [facility] ENDOSCOPY;  Service: Endoscopy;  Laterality: N/A;  . DIALYSIS/PERMA CATHETER INSERTION N/A 02/23/2018   Procedure: DIALYSIS/PERMA CATHETER INSERTION;  Surgeon: Algernon Huxley, MD;  Location: [facility] CV LAB;  Service: Cardiovascular;  Laterality: N/A;  . KNEE SURGERY    . TUBAL LIGATION      Social History   Socioeconomic History  . Marital status: Married    Spouse name: Not on file  . Number of children: Not on file  . Years of education: Not on file  . Highest education level: Not on file  Occupational History  . Not on file  Social Needs  . Financial resource strain: Not on file  . Food insecurity:    Worry: Not on file    Inability: Not on file  . Transportation needs:    Medical: Not on file    Non-medical: Not on file  Tobacco Use  . Smoking status: Never Smoker  . Smokeless tobacco: Never Used  Substance and Sexual Activity  . Alcohol use: No  . Drug use: No  . Sexual activity: Not on file  Lifestyle  . Physical activity:    Days per week: Not on file    Minutes per session: Not on file  . Stress: Not on file  Relationships  . Social connections:    Talks on phone:  Not on file    Gets together: Not on file    Attends religious service: Not on file    Active member of club or organization: Not on file    Attends meetings of clubs or organizations: Not on file    Relationship status: Not on file  . Intimate partner violence:    Fear of current or ex partner: Not on file    Emotionally abused: Not on file    Physically abused: Not on file    Forced sexual activity: Not on file  Other Topics Concern  . Not on file  Social History Narrative  . Not on file    History reviewed. No pertinent family history.  No Known  Allergies   Review of Systems   Review of Systems: Negative Unless Checked Constitutional: [] Weight loss  [] Fever  [] Chills Cardiac: [] Chest pain   []  Atrial Fibrillation  [] Palpitations   [] Shortness of breath when laying flat   [] Shortness of breath with exertion. [] Shortness of breath at rest Vascular:  [] Pain in legs with walking   [] Pain in legs with standing [] Pain in legs when laying flat   [] Claudication    [] Pain in feet when laying flat    [] History of DVT   [] Phlebitis   [] Swelling in legs   [] Varicose veins   [] Non-healing ulcers Pulmonary:   [] Uses home oxygen   [] Productive cough   [] Hemoptysis   [] Wheeze  [] COPD   [] Asthma Neurologic:  [x] Dizziness   [] Seizures  [] Blackouts [] History of stroke   [] History of TIA  [] Aphasia   [] Temporary Blindness   [] Weakness or numbness in arm   [] Weakness or numbness in leg Musculoskeletal:   [] Joint swelling   [] Joint pain   [] Low back pain  []  History of Knee Replacement [] Arthritis [] back Surgeries  []  Spinal Stenosis    Hematologic:  [] Easy bruising  [] Easy bleeding   [] Hypercoagulable state   [x] Anemic Gastrointestinal:  [] Diarrhea   [] Vomiting  [] Gastroesophageal reflux/heartburn   [] Difficulty swallowing. [] Abdominal pain Genitourinary:  [x] Chronic kidney disease   [] Difficult urination  [] Anuric   [] Blood in urine [] Frequent urination  [] Burning with urination   [] Hematuria Skin:  [] Rashes   [] Ulcers [] Wounds Psychological:  [] History of anxiety   []  History of major depression  []  Memory Difficulties     Objective:   Physical Exam  There were no vitals taken for this visit.  Gen: WD/WN, NAD Head: [place]/AT, No temporalis wasting.  Ear/Nose/Throat: Hearing grossly intact, nares w/o erythema or drainage Eyes: PER, EOMI, sclera nonicteric.  Neck: Supple, no masses.  No JVD.  Pulmonary:  Good air movement, no use of accessory muscles.  Cardiac: RRR Vascular:  Vessel Right Left  Radial Palpable Palpable  Gastrointestinal: soft,  non-distended. No guarding/no peritoneal signs.  Musculoskeletal: M/S 5/5 throughout.  No deformity or atrophy.  Neurologic: Pain and light touch intact in extremities.  Symmetrical.  Speech is fluent. Motor exam as listed above. Psychiatric: Judgment intact, Mood & affect appropriate for pt's clinical situation. Dermatologic: No Venous rashes. No Ulcers Noted.  No changes consistent with cellulitis. Lymph : No Cervical lymphadenopathy, no lichenification or skin changes of chronic lymphedema.      Assessment & Plan:   1. End stage renal disease on dialysis [facility]) Recommend:  At this time the patient does not have appropriate extremity access for dialysis  Patient should have a left axillary graft created when it is determined that she will need permanent access for dialysis  The risks, benefits and alternative therapies were reviewed in detail with the patient.  All questions were answered.  The patient agrees to proceed with surgery should it be determined that she needs permanent dialysis.  If it is found that she does not, we will hold off until it is determined that a graft is necessary.  We will follow up with the patient's dialysis center regarding her lab work, and then plan accordingly.    2. Hyperlipidemia, unspecified hyperlipidemia type Continue statin as ordered and reviewed, no changes at this time   3. Essential hypertension Continue antihypertensive medications as already ordered, these medications have been reviewed and there are no changes at this time.    Current Outpatient Medications on File Prior to Visit  Medication Sig Dispense Refill  . amLODipine (NORVASC) 5 MG tablet Take 1 tablet (5 mg total) by mouth daily. 30 tablet 0  . aspirin 81 MG tablet Take 81 mg by mouth daily.    . cetirizine (ZYRTEC) 10 MG tablet Take 10 mg by mouth at bedtime.    . docusate sodium (COLACE) 100 MG capsule Take 1 capsule (100 mg total) by mouth 2 (two) times daily. 10  capsule 0  . insulin detemir (LEVEMIR) 100 UNIT/ML injection Inject 20-25 Units into the skin See admin instructions. Inject 25u under the skin every morning and inject 20u under the skin ever evening    . metoprolol tartrate (LOPRESSOR) 25 MG tablet Take 25 mg by mouth 2 (two) times daily.     No current facility-administered medications on file prior to visit.     There are no Patient Instructions on file for this visit. No follow-ups on file.   Kris Hartmann, NP  This note was completed with Sales executive.  Any errors are purely unintentional.

## 2018-04-14 ENCOUNTER — Telehealth (INDEPENDENT_AMBULATORY_CARE_PROVIDER_SITE_OTHER): Payer: Self-pay | Admitting: Nurse Practitioner

## 2018-04-14 NOTE — Telephone Encounter (Signed)
Called interpreter line and spoke with Mickel Baas ID # [phone], she called patient and delivered message. Stated that dialysis did call and tell her not to show for her appointments. She also said they prescribed her a medication she is not sure what the name is and that she has blood draws scheduled for the upcoming weeks.  Patient verbalized understanding and has no questions at this time

## 2018-04-21 ENCOUNTER — Other Ambulatory Visit (INDEPENDENT_AMBULATORY_CARE_PROVIDER_SITE_OTHER): Payer: Self-pay | Admitting: Nurse Practitioner

## 2018-04-23 MED ORDER — CEFAZOLIN SODIUM-DEXTROSE 1-4 GM/50ML-% IV SOLN: 1.0000 g | Freq: Once | INTRAVENOUS | Status: DC

## 2018-04-24 ENCOUNTER — Ambulatory Visit
Admission: RE | Admit: 2018-04-24 | Discharge: 2018-04-24 | Disposition: A | Discharge status: Home / Self Care | Payer: 59 | Attending: Vascular Surgery | Admitting: Vascular Surgery

## 2018-04-24 ENCOUNTER — Encounter
Admission: RE | Disposition: A | Discharge status: Home / Self Care | Payer: Self-pay | Source: Home / Self Care | Attending: Vascular Surgery

## 2018-04-24 DIAGNOSIS — E785 Hyperlipidemia, unspecified: Secondary | ICD-10-CM | POA: Insufficient documentation

## 2018-04-24 DIAGNOSIS — I252 Old myocardial infarction: Secondary | ICD-10-CM | POA: Insufficient documentation

## 2018-04-24 DIAGNOSIS — Z452 Encounter for adjustment and management of vascular access device: Secondary | ICD-10-CM | POA: Insufficient documentation

## 2018-04-24 DIAGNOSIS — Z79899 Other long term (current) drug therapy: Secondary | ICD-10-CM | POA: Insufficient documentation

## 2018-04-24 DIAGNOSIS — N186 End stage renal disease: Secondary | ICD-10-CM | POA: Insufficient documentation

## 2018-04-24 DIAGNOSIS — Z992 Dependence on renal dialysis: Secondary | ICD-10-CM | POA: Insufficient documentation

## 2018-04-24 DIAGNOSIS — Z9851 Tubal ligation status: Secondary | ICD-10-CM | POA: Insufficient documentation

## 2018-04-24 DIAGNOSIS — N189 Chronic kidney disease, unspecified: Secondary | ICD-10-CM

## 2018-04-24 DIAGNOSIS — I6529 Occlusion and stenosis of unspecified carotid artery: Secondary | ICD-10-CM | POA: Insufficient documentation

## 2018-04-24 DIAGNOSIS — E1122 Type 2 diabetes mellitus with diabetic chronic kidney disease: Secondary | ICD-10-CM | POA: Insufficient documentation

## 2018-04-24 DIAGNOSIS — I120 Hypertensive chronic kidney disease with stage 5 chronic kidney disease or end stage renal disease: Secondary | ICD-10-CM | POA: Insufficient documentation

## 2018-04-24 DIAGNOSIS — Z7982 Long term (current) use of aspirin: Secondary | ICD-10-CM | POA: Insufficient documentation

## 2018-04-24 DIAGNOSIS — Z794 Long term (current) use of insulin: Secondary | ICD-10-CM | POA: Insufficient documentation

## 2018-04-24 HISTORY — PX: DIALYSIS/PERMA CATHETER REMOVAL: CATH118289

## 2018-04-24 SURGERY — DIALYSIS/PERMA CATHETER REMOVAL
Anesthesia: LOCAL

## 2018-04-24 MED ORDER — DIPHENHYDRAMINE HCL 50 MG/ML IJ SOLN: 50.0000 mg | Freq: Once | INTRAMUSCULAR | Status: DC | PRN

## 2018-04-24 MED ORDER — ONDANSETRON HCL 4 MG/2ML IJ SOLN: 4.0000 mg | Freq: Four times a day (QID) | INTRAMUSCULAR | Status: DC | PRN

## 2018-04-24 MED ORDER — METHYLPREDNISOLONE SODIUM SUCC 125 MG IJ SOLR: 125.0000 mg | Freq: Once | INTRAMUSCULAR | Status: DC | PRN

## 2018-04-24 MED ORDER — SODIUM CHLORIDE 0.9 % IV SOLN: INTRAVENOUS | Status: DC

## 2018-04-24 MED ORDER — FAMOTIDINE 20 MG PO TABS: 40.0000 mg | ORAL_TABLET | Freq: Once | ORAL | Status: DC | PRN

## 2018-04-24 MED ORDER — MIDAZOLAM HCL 2 MG/ML PO SYRP: 8.0000 mg | ORAL_SOLUTION | Freq: Once | ORAL | Status: DC | PRN

## 2018-04-24 MED ORDER — HYDROMORPHONE HCL 1 MG/ML IJ SOLN: 1.0000 mg | Freq: Once | INTRAMUSCULAR | Status: DC | PRN

## 2018-04-24 SURGICAL SUPPLY — 2 items
FORCEPS HALSTEAD CVD 5IN STRL (INSTRUMENTS) ×2 IMPLANT
TRAY LACERAT/PLASTIC (MISCELLANEOUS) ×2 IMPLANT

## 2018-04-24 NOTE — Discharge Instructions (Signed)
Tunneled Catheter Removal, Care After °Refer to this sheet in the next few weeks. These instructions provide you with information about caring for yourself after your procedure. Your health care provider may also give you more specific instructions. Your treatment has been planned according to current medical practices, but problems sometimes occur. Call your health care provider if you have any problems or questions after your procedure. °What can I expect after the procedure? °After the procedure, it is common to have: °· Some mild redness, swelling, and pain around your catheter site. ° ° °Follow these instructions at home: °Incision care  °· Check your removal site  every day for signs of infection. Check for: °¨ More redness, swelling, or pain. °¨ More fluid or blood. °¨ Warmth. °¨ Pus or a bad smell. °· Follow instructions from your health care provider about how to take care of your removal site. Make sure you: °¨ Wash your hands with soap and water before you change your bandages (dressings). If soap and water are not available, use hand sanitizer. °Activity  °· Return to your normal activities as told by your health care provider. Ask your health care provider what activities are safe for you. °· Do not lift anything that is heavier than 10 lb (4.5 kg) for 3 weeks or as long as told by your health care provider. ° °Contact a health care provider if: °· You have more fluid or blood coming from your removal site °· You have more redness, swelling, or pain at your incisions or around the area where your catheter was removed °· Your removal site feel warm to the touch. °· You feel unusually weak. °· You feel nauseous.. °· Get help right away if °· You have swelling in your arm, shoulder, neck, or face. °· You develop chest pain. °· You have difficulty breathing. °· You feel dizzy or light-headed. °· You have pus or a bad smell coming from your removal site °· You have a fever. °· You develop bleeding from your  removal site, and your bleeding does not stop. °This information is not intended to replace advice given to you by your health care provider. Make sure you discuss any questions you have with your health care provider. °Document Released: 02/23/2012 Document Revised: 11/09/2015 Document Reviewed: 12/02/2014 °Elsevier Interactive Patient Education © 2017 [employer] ° °

## 2018-04-24 NOTE — Op Note (Signed)
Operative Note     Preoperative diagnosis:   1. CKD not using her Permcath  Postoperative diagnosis:  1. Same as above  Procedure:  Removal of right IJ Permcath  Surgeon:  Leotis Pain, MD  Anesthesia:  Local  EBL:  Minimal  Indication for the Procedure:  The patient has had some return of her renal function and no longer needs their permcath.  This can be removed.  Risks and benefits are discussed and informed consent is obtained.  Description of the Procedure:  The patient's right neck, chest and existing catheter were sterilely prepped and draped. The area around the catheter was anesthetized copiously with 1% lidocaine. The catheter was dissected out with curved hemostats until the cuff was freed from the surrounding fibrous sheath. The fiber sheath was transected, and the catheter was then removed in its entirety using gentle traction. Pressure was held and sterile dressings were placed. The patient tolerated the procedure well and was taken to the recovery room in stable condition.     Leotis Pain  04/24/2018, 2:38 PM This note was created with Dragon Medical transcription system. Any errors in dictation are purely unintentional.

## 2018-04-24 NOTE — H&P (Signed)
[facility] History & Physical Update  The patient was interviewed and re-examined.  The patient's previous History and Physical has been reviewed and is unchanged.  The patient does not need her Permcath at this time and it can be removed. We plan to proceed with the scheduled procedure.  Leotis Pain, MD  04/24/2018, 12:59 PM

## 2018-04-25 ENCOUNTER — Encounter: Payer: Self-pay | Admitting: Vascular Surgery

## 2018-04-25 ENCOUNTER — Encounter (INDEPENDENT_AMBULATORY_CARE_PROVIDER_SITE_OTHER): Payer: Self-pay | Admitting: Vascular Surgery

## 2018-07-03 ENCOUNTER — Ambulatory Visit: Payer: Self-pay | Admitting: General Surgery

## 2018-07-05 ENCOUNTER — Ambulatory Visit: Payer: Self-pay | Admitting: General Surgery

## 2018-07-19 ENCOUNTER — Ambulatory Visit (INDEPENDENT_AMBULATORY_CARE_PROVIDER_SITE_OTHER): Payer: BLUE CROSS/BLUE SHIELD | Admitting: General Surgery

## 2018-07-19 ENCOUNTER — Other Ambulatory Visit: Payer: Self-pay

## 2018-07-19 ENCOUNTER — Encounter: Payer: Self-pay | Admitting: General Surgery

## 2018-07-19 VITALS — BP 232/71 | HR 67 | Temp 97.7°F | Resp 16 | Ht <= 58 in | Wt 109.6 lb

## 2018-07-19 DIAGNOSIS — Z8719 Personal history of other diseases of the digestive system: Secondary | ICD-10-CM | POA: Diagnosis not present

## 2018-07-19 NOTE — Progress Notes (Signed)
[name] returns to clinic today to further discuss her gallbladder.  She was hospitalized with urosepsis in December.  She was on multiple vasoactive medications at that time.  She had an elevation in her LFTs which prompted the nurse practitioner to obtain studies that suggested cholecystitis.  Due to her critical illness, she was not a suitable candidate for surgery.  She had a percutaneous cholecystostomy tube placed.  She ultimately recovered and was discharged.  I saw her in the middle of December.  We discussed whether or not she felt ready to proceed with operative intervention.  At that time, she was still on hemodialysis, and was quite debilitated.  She deferred at that time; today's visit is to discuss whether or not she actually needs to pursue surgery.  Today, she states that she has recovered completely.  She is no longer on dialysis, and her catheter has been removed.  She overall feels well.  She denies any right upper quadrant pain.  She has not had any fevers, chills, nausea, or vomiting.  She is able to eat whatever she pleases without any untoward consequences (specifically right upper quadrant pain).  This visit was performed with the assistance of a Spanish language interpreter.  Past Medical History:  Diagnosis Date  . Carotid stenosis   . Diabetes mellitus without complication ([place])   . ESRD on dialysis ([place])   . Hypercholesterolemia   . Hypertension   . Myocardial infarction ([place])   . Tachycardia    Past Surgical History:  Procedure Laterality Date  . CARDIAC CATHETERIZATION    . COLONOSCOPY WITH PROPOFOL N/A 10/02/2015   Procedure: COLONOSCOPY WITH PROPOFOL;  Surgeon: Manya Silvas, MD;  Location: [facility] ENDOSCOPY;  Service: Endoscopy;  Laterality: N/A;  . DIALYSIS/PERMA CATHETER INSERTION N/A 02/23/2018   Procedure: DIALYSIS/PERMA CATHETER INSERTION;  Surgeon: Algernon Huxley, MD;  Location: [facility];  Service: Cardiovascular;  Laterality: N/A;  .  DIALYSIS/PERMA CATHETER REMOVAL N/A 04/24/2018   Procedure: DIALYSIS/PERMA CATHETER REMOVAL;  Surgeon: Algernon Huxley, MD;  Location: [facility];  Service: Cardiovascular;  Laterality: N/A;  . KNEE SURGERY    . TUBAL LIGATION     Family History  Problem Relation Age of Onset  . Diabetes Father   . Diabetes Sister    Social History   Tobacco Use  . Smoking status: Never Smoker  . Smokeless tobacco: Never Used  Substance Use Topics  . Alcohol use: No  . Drug use: No   Current Meds  Medication Sig  . aspirin 81 MG tablet Take 81 mg by mouth daily.  . furosemide (LASIX) 40 MG tablet Take 40 mg by mouth.  . insulin detemir (LEVEMIR) 100 UNIT/ML injection Inject 20-25 Units into the skin See admin instructions. Inject 25u under the skin every morning and inject 20u under the skin ever evening  . metoprolol tartrate (LOPRESSOR) 25 MG tablet Take 25 mg by mouth 2 (two) times daily.  . Multiple Vitamin (MULTIVITAMIN WITH MINERALS) TABS tablet Take 1 tablet by mouth daily.   No Known Allergies  Physical Exam: Vitals:   07/19/18 0944  BP: (!) 232/71  Pulse: 67  Resp: 16  Temp: 97.7 F (36.5 C)  SpO2: 99%   General: No acute distress, alert and oriented x3. HEENT: No scleral icterus.  Remainder of the face is covered by mask, due to COVID-19. Cardiovascular: Regular rate and rhythm. Lungs: Normal work of breathing on room air Abdomen: Soft, nontender, nondistended.  No  rebound or guarding.  No Murphy sign.  Assessment and plan: This is a 63 year old woman who was hospitalized last November and December with urosepsis.  Due to her critical illness, she developed elevated liver function studies and imaging suggested possible cholecystitis, she was treated conservatively.  Since her discharge and recovery, she has had no further episodes of right upper quadrant pain.  At this time, I do not think she warrants cholecystectomy.  She was advised, that if she develops right upper  quadrant pain, nausea, vomiting, worsening discomfort after eating, she should contact our office for evaluation.  In addition, today her blood pressure was found to be markedly elevated.  It was remeasured and was still elevated.  We contacted the patient's primary care provider, Dr. Sena Hitch.  He advised the patient to present to the emergency department for evaluation and treatment.  The patient expressed her understanding of this advice and indicated that she was planning to follow through.  We will see her in our office on an as-needed basis

## 2018-07-19 NOTE — Patient Instructions (Addendum)
Please go straight to the emergency room due to your elevated blood pressure.   Please call our office if you have questions or concerns.     Gallbladder Eating Plan If you have a gallbladder condition, you may have trouble digesting fats. Eating a low-fat diet can help reduce your symptoms, and may be helpful before and after having surgery to remove your gallbladder (cholecystectomy). Your health care provider may recommend that you work with a diet and nutrition specialist (dietitian) to help you reduce the amount of fat in your diet. What are tips for following this plan? General guidelines  Limit your fat intake to less than 30% of your total daily calories. If you eat around 1,800 calories each day, this is less than 60 grams (g) of fat per day.  Fat is an important part of a healthy diet. Eating a low-fat diet can make it hard to maintain a healthy body weight. Ask your dietitian how much fat, calories, and other nutrients you need each day.  Eat small, frequent meals throughout the day instead of three large meals.  Drink at least 8-10 cups of fluid a day. Drink enough fluid to keep your urine clear or pale yellow.  Limit alcohol intake to no more than 1 drink a day for nonpregnant women and 2 drinks a day for men. One drink equals 12 oz of beer, 5 oz of wine, or 1 oz of hard liquor. Reading food labels  Check Nutrition Facts on food labels for the amount of fat per serving. Choose foods with less than 3 grams of fat per serving. Shopping  Choose nonfat and low-fat healthy foods. Look for the words "nonfat," "low fat," or "fat free."  Avoid buying processed or prepackaged foods. Cooking  Cook using low-fat methods, such as baking, broiling, grilling, or boiling.  Cook with small amounts of healthy fats, such as olive oil, grapeseed oil, canola oil, or sunflower oil. What foods are recommended?   All fresh, frozen, or canned fruits and vegetables.  Whole grains.   Low-fat or non-fat (skim) milk and yogurt.  Lean meat, skinless poultry, fish, eggs, and beans.  Low-fat protein supplement powders or drinks.  Spices and herbs. What foods are not recommended?  High-fat foods. These include baked goods, fast food, fatty cuts of meat, ice cream, french toast, sweet rolls, pizza, cheese bread, foods covered with butter, creamy sauces, or cheese.  Fried foods. These include french fries, tempura, battered fish, breaded chicken, fried breads, and sweets.  Foods with strong odors.  Foods that cause bloating and gas. Summary  A low-fat diet can be helpful if you have a gallbladder condition, or before and after gallbladder surgery.  Limit your fat intake to less than 30% of your total daily calories. This is about 60 g of fat if you eat 1,800 calories each day.  Eat small, frequent meals throughout the day instead of three large meals. This information is not intended to replace advice given to you by your health care provider. Make sure you discuss any questions you have with your health care provider. Document Released: 03/13/2013 Document Revised: 04/15/2016 Document Reviewed: 04/15/2016 Elsevier Interactive Patient Education  2019 [employer].

## 2018-12-04 IMAGING — DX DG CHEST 1V PORT
1 series · 1 of 1 positions shown · non-contrast
Comparison: 02/19/2018 and earlier.

CLINICAL DATA: 62-year-old female with congestive heart failure.

EXAM:
PORTABLE CHEST 1 VIEW

[chest ap]
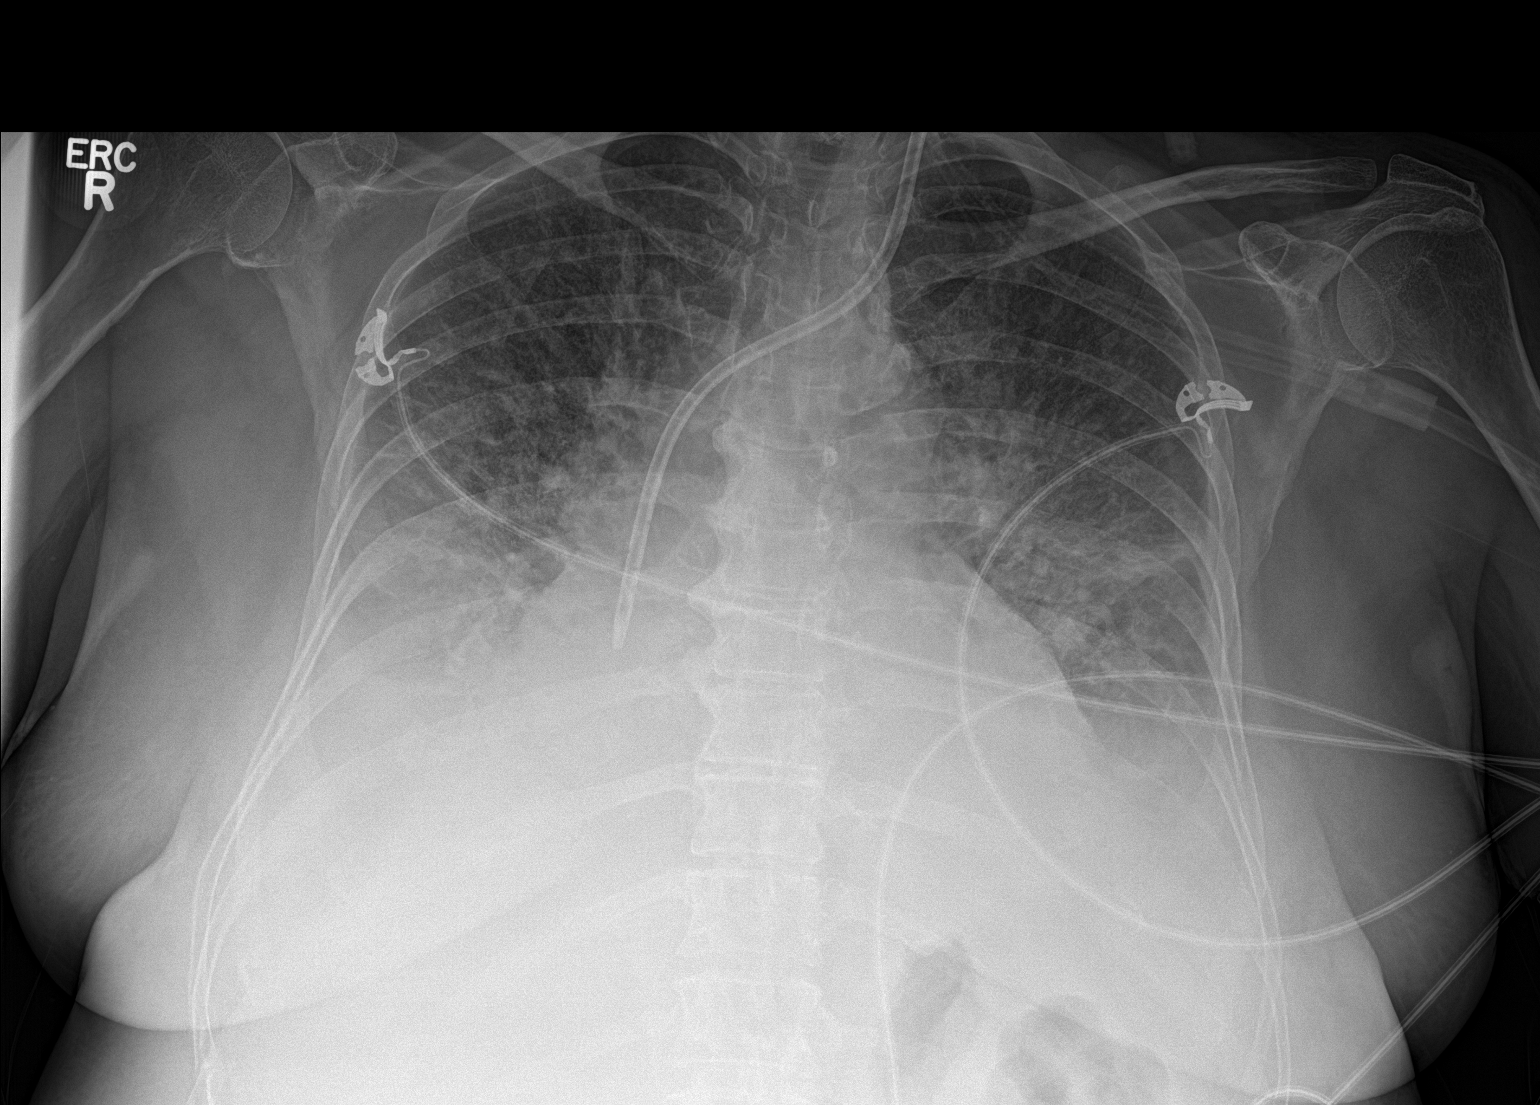

[1 of 1 positions shown; findings below may reference images not displayed]

FINDINGS: Portable AP upright view at 0002 hours. Stable left IJ approach
central line. Stable lung volumes. Veiling bibasilar opacity with
confluent retrocardiac opacity. Stable cardiac size and mediastinal
contours. Persistent increased pulmonary vascular congestion. No
pneumothorax. Negative visible bowel gas pattern.
IMPRESSION: 1. Persistent pulmonary vascular congestion suggesting interstitial
edema.
2. Continued bilateral pleural effusions and lower lobe collapse or
consolidation.

## 2019-04-18 ENCOUNTER — Other Ambulatory Visit: Payer: Self-pay | Admitting: Obstetrics and Gynecology

## 2019-04-18 DIAGNOSIS — Z1231 Encounter for screening mammogram for malignant neoplasm of breast: Secondary | ICD-10-CM

## 2021-01-05 ENCOUNTER — Encounter: Payer: Self-pay | Admitting: General Surgery
